# Patient Record
Sex: Male | Born: 1940 | Race: White | Hispanic: No | Smoking: Never smoker
Health system: Southern US, Community
[De-identification: ages and names within clinical notes are randomized; demographics above are authoritative.]

## PROBLEM LIST (undated history)

## (undated) DIAGNOSIS — I10 Essential (primary) hypertension: Secondary | ICD-10-CM

## (undated) DIAGNOSIS — Z87442 Personal history of urinary calculi: Secondary | ICD-10-CM

## (undated) DIAGNOSIS — Z9861 Coronary angioplasty status: Secondary | ICD-10-CM

## (undated) DIAGNOSIS — K5730 Diverticulosis of large intestine without perforation or abscess without bleeding: Secondary | ICD-10-CM

## (undated) DIAGNOSIS — H269 Unspecified cataract: Secondary | ICD-10-CM

## (undated) DIAGNOSIS — I255 Ischemic cardiomyopathy: Secondary | ICD-10-CM

## (undated) DIAGNOSIS — I4510 Unspecified right bundle-branch block: Secondary | ICD-10-CM

## (undated) DIAGNOSIS — J189 Pneumonia, unspecified organism: Secondary | ICD-10-CM

## (undated) DIAGNOSIS — I639 Cerebral infarction, unspecified: Secondary | ICD-10-CM

## (undated) DIAGNOSIS — G629 Polyneuropathy, unspecified: Secondary | ICD-10-CM

## (undated) DIAGNOSIS — N471 Phimosis: Secondary | ICD-10-CM

## (undated) DIAGNOSIS — Z85828 Personal history of other malignant neoplasm of skin: Secondary | ICD-10-CM

## (undated) DIAGNOSIS — R351 Nocturia: Secondary | ICD-10-CM

## (undated) DIAGNOSIS — Z973 Presence of spectacles and contact lenses: Secondary | ICD-10-CM

## (undated) DIAGNOSIS — R112 Nausea with vomiting, unspecified: Secondary | ICD-10-CM

## (undated) DIAGNOSIS — Z86010 Personal history of colonic polyps: Secondary | ICD-10-CM

## (undated) DIAGNOSIS — I219 Acute myocardial infarction, unspecified: Secondary | ICD-10-CM

## (undated) DIAGNOSIS — Z8489 Family history of other specified conditions: Secondary | ICD-10-CM

## (undated) DIAGNOSIS — I252 Old myocardial infarction: Secondary | ICD-10-CM

## (undated) DIAGNOSIS — M1990 Unspecified osteoarthritis, unspecified site: Secondary | ICD-10-CM

## (undated) DIAGNOSIS — Z955 Presence of coronary angioplasty implant and graft: Secondary | ICD-10-CM

## (undated) DIAGNOSIS — G4733 Obstructive sleep apnea (adult) (pediatric): Secondary | ICD-10-CM

## (undated) DIAGNOSIS — Z9889 Other specified postprocedural states: Secondary | ICD-10-CM

## (undated) DIAGNOSIS — Z8673 Personal history of transient ischemic attack (TIA), and cerebral infarction without residual deficits: Secondary | ICD-10-CM

## (undated) DIAGNOSIS — K219 Gastro-esophageal reflux disease without esophagitis: Secondary | ICD-10-CM

## (undated) DIAGNOSIS — E119 Type 2 diabetes mellitus without complications: Secondary | ICD-10-CM

## (undated) DIAGNOSIS — E785 Hyperlipidemia, unspecified: Secondary | ICD-10-CM

## (undated) DIAGNOSIS — Z860101 Personal history of adenomatous and serrated colon polyps: Secondary | ICD-10-CM

## (undated) DIAGNOSIS — I2510 Atherosclerotic heart disease of native coronary artery without angina pectoris: Secondary | ICD-10-CM

## (undated) HISTORY — PX: CARDIOVASCULAR STRESS TEST: SHX262

## (undated) HISTORY — PX: CERVICAL SPINE SURGERY: SHX589

## (undated) HISTORY — PX: HERNIA REPAIR: SHX51

## (undated) HISTORY — DX: Gastro-esophageal reflux disease without esophagitis: K21.9

## (undated) HISTORY — PX: APPENDECTOMY: SHX54

## (undated) HISTORY — DX: Hyperlipidemia, unspecified: E78.5

## (undated) HISTORY — DX: Essential (primary) hypertension: I10

## (undated) HISTORY — PX: LAPAROSCOPIC CHOLECYSTECTOMY: SUR755

## (undated) HISTORY — DX: Cerebral infarction, unspecified: I63.9

## (undated) HISTORY — DX: Unspecified osteoarthritis, unspecified site: M19.90

## (undated) HISTORY — PX: OTHER SURGICAL HISTORY: SHX169

## (undated) HISTORY — PX: DIAGNOSTIC LAPAROSCOPY: SUR761

## (undated) HISTORY — PX: COLON SURGERY: SHX602

## (undated) HISTORY — PX: CARDIAC CATHETERIZATION: SHX172

## (undated) HISTORY — PX: CATARACT EXTRACTION: SUR2

---

## 2002-04-20 ENCOUNTER — Ambulatory Visit (HOSPITAL_BASED_OUTPATIENT_CLINIC_OR_DEPARTMENT_OTHER): Admission: RE | Admit: 2002-04-20 | Discharge: 2002-04-20 | Payer: Self-pay | Admitting: Family Medicine

## 2002-06-12 ENCOUNTER — Ambulatory Visit (HOSPITAL_BASED_OUTPATIENT_CLINIC_OR_DEPARTMENT_OTHER): Admission: RE | Admit: 2002-06-12 | Discharge: 2002-06-12 | Payer: Self-pay | Admitting: Family Medicine

## 2003-12-20 ENCOUNTER — Ambulatory Visit (HOSPITAL_COMMUNITY): Admission: RE | Admit: 2003-12-20 | Discharge: 2003-12-20 | Payer: Self-pay | Admitting: Specialist

## 2004-01-23 ENCOUNTER — Encounter: Admission: RE | Admit: 2004-01-23 | Discharge: 2004-01-23 | Payer: Self-pay | Admitting: Specialist

## 2004-05-23 ENCOUNTER — Emergency Department (HOSPITAL_COMMUNITY): Admission: EM | Admit: 2004-05-23 | Discharge: 2004-05-23 | Payer: Self-pay | Admitting: Emergency Medicine

## 2004-09-16 ENCOUNTER — Ambulatory Visit: Payer: Self-pay | Admitting: *Deleted

## 2004-09-16 ENCOUNTER — Inpatient Hospital Stay (HOSPITAL_COMMUNITY): Admission: AD | Admit: 2004-09-16 | Discharge: 2004-09-18 | Payer: Self-pay | Admitting: *Deleted

## 2004-09-30 ENCOUNTER — Ambulatory Visit: Payer: Self-pay | Admitting: Internal Medicine

## 2005-10-28 ENCOUNTER — Ambulatory Visit: Payer: Self-pay | Admitting: Cardiology

## 2005-11-22 ENCOUNTER — Encounter (INDEPENDENT_AMBULATORY_CARE_PROVIDER_SITE_OTHER): Payer: Self-pay | Admitting: *Deleted

## 2005-11-22 ENCOUNTER — Ambulatory Visit (HOSPITAL_COMMUNITY): Admission: RE | Admit: 2005-11-22 | Discharge: 2005-11-22 | Payer: Self-pay | Admitting: General Surgery

## 2007-05-15 ENCOUNTER — Encounter: Admission: RE | Admit: 2007-05-15 | Discharge: 2007-05-15 | Payer: Self-pay | Admitting: Internal Medicine

## 2008-12-10 ENCOUNTER — Ambulatory Visit: Payer: Self-pay

## 2008-12-10 ENCOUNTER — Encounter (INDEPENDENT_AMBULATORY_CARE_PROVIDER_SITE_OTHER): Payer: Self-pay | Admitting: Orthopedic Surgery

## 2008-12-10 ENCOUNTER — Encounter: Payer: Self-pay | Admitting: Cardiovascular Disease

## 2009-01-06 ENCOUNTER — Inpatient Hospital Stay (HOSPITAL_COMMUNITY): Admission: EM | Admit: 2009-01-06 | Discharge: 2009-01-08 | Payer: Self-pay | Admitting: Emergency Medicine

## 2009-01-06 ENCOUNTER — Ambulatory Visit: Payer: Self-pay | Admitting: Cardiovascular Disease

## 2009-01-06 ENCOUNTER — Encounter (INDEPENDENT_AMBULATORY_CARE_PROVIDER_SITE_OTHER): Payer: Self-pay | Admitting: Neurology

## 2009-01-08 ENCOUNTER — Encounter: Payer: Self-pay | Admitting: Internal Medicine

## 2009-07-09 ENCOUNTER — Ambulatory Visit (HOSPITAL_COMMUNITY): Admission: RE | Admit: 2009-07-09 | Discharge: 2009-07-09 | Payer: Self-pay | Admitting: Family Medicine

## 2009-07-17 ENCOUNTER — Ambulatory Visit (HOSPITAL_COMMUNITY): Admission: RE | Admit: 2009-07-17 | Discharge: 2009-07-17 | Payer: Self-pay | Admitting: Family Medicine

## 2009-09-01 ENCOUNTER — Inpatient Hospital Stay (HOSPITAL_COMMUNITY): Admission: RE | Admit: 2009-09-01 | Discharge: 2009-09-07 | Payer: Self-pay | Admitting: General Surgery

## 2009-09-01 ENCOUNTER — Encounter (INDEPENDENT_AMBULATORY_CARE_PROVIDER_SITE_OTHER): Payer: Self-pay | Admitting: General Surgery

## 2010-07-10 ENCOUNTER — Encounter
Admission: RE | Admit: 2010-07-10 | Discharge: 2010-07-10 | Payer: Self-pay | Source: Home / Self Care | Attending: General Surgery | Admitting: General Surgery

## 2010-08-20 IMAGING — CR DG CHEST 2V
2 series · 2 of 2 positions shown · non-contrast
Comparison: 05/15/2007 and 11/18/2005

CLINICAL DATA: Preoperative respiratory examination for bowel
surgery.

CHEST - 2 VIEW

[w chest pa]
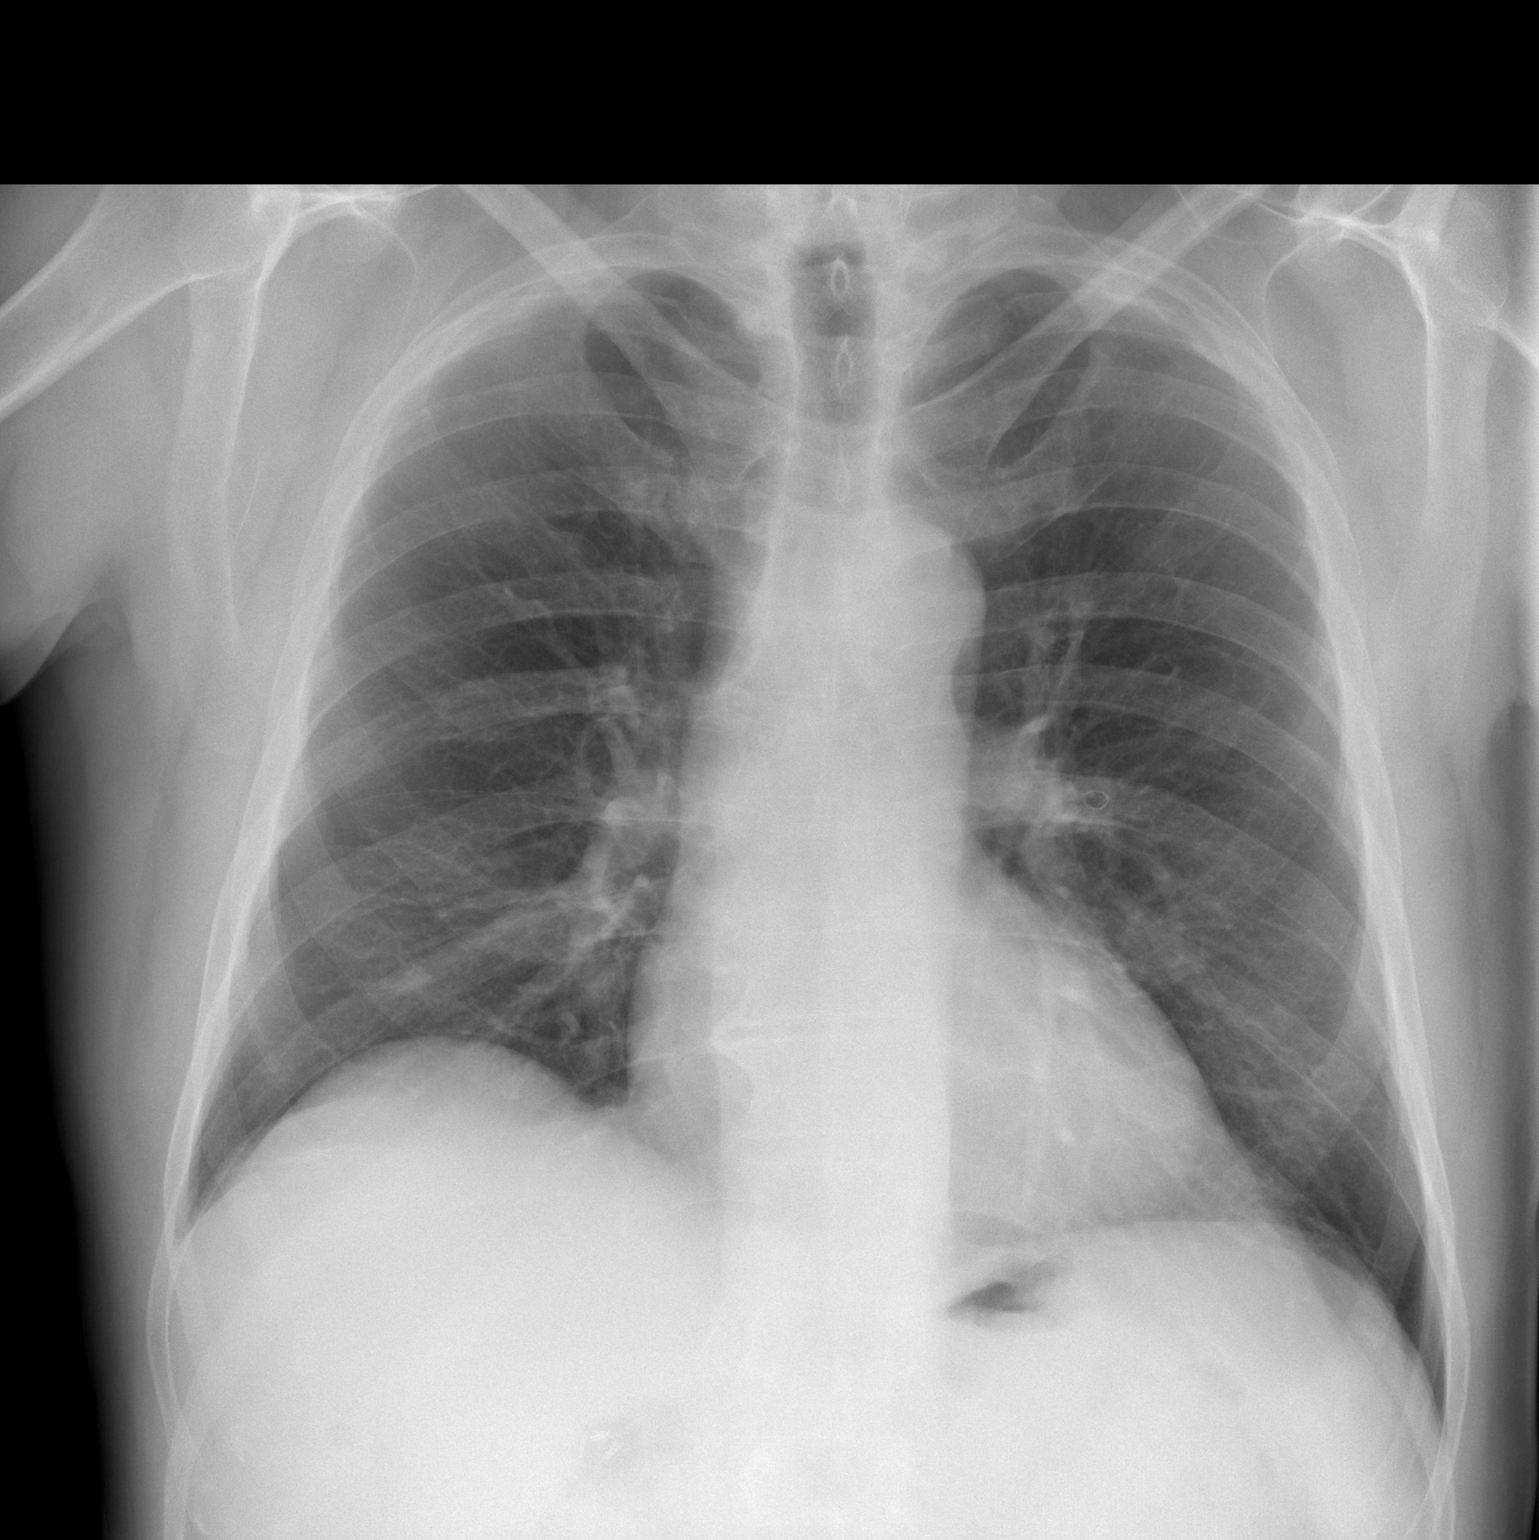

[w chest lat]
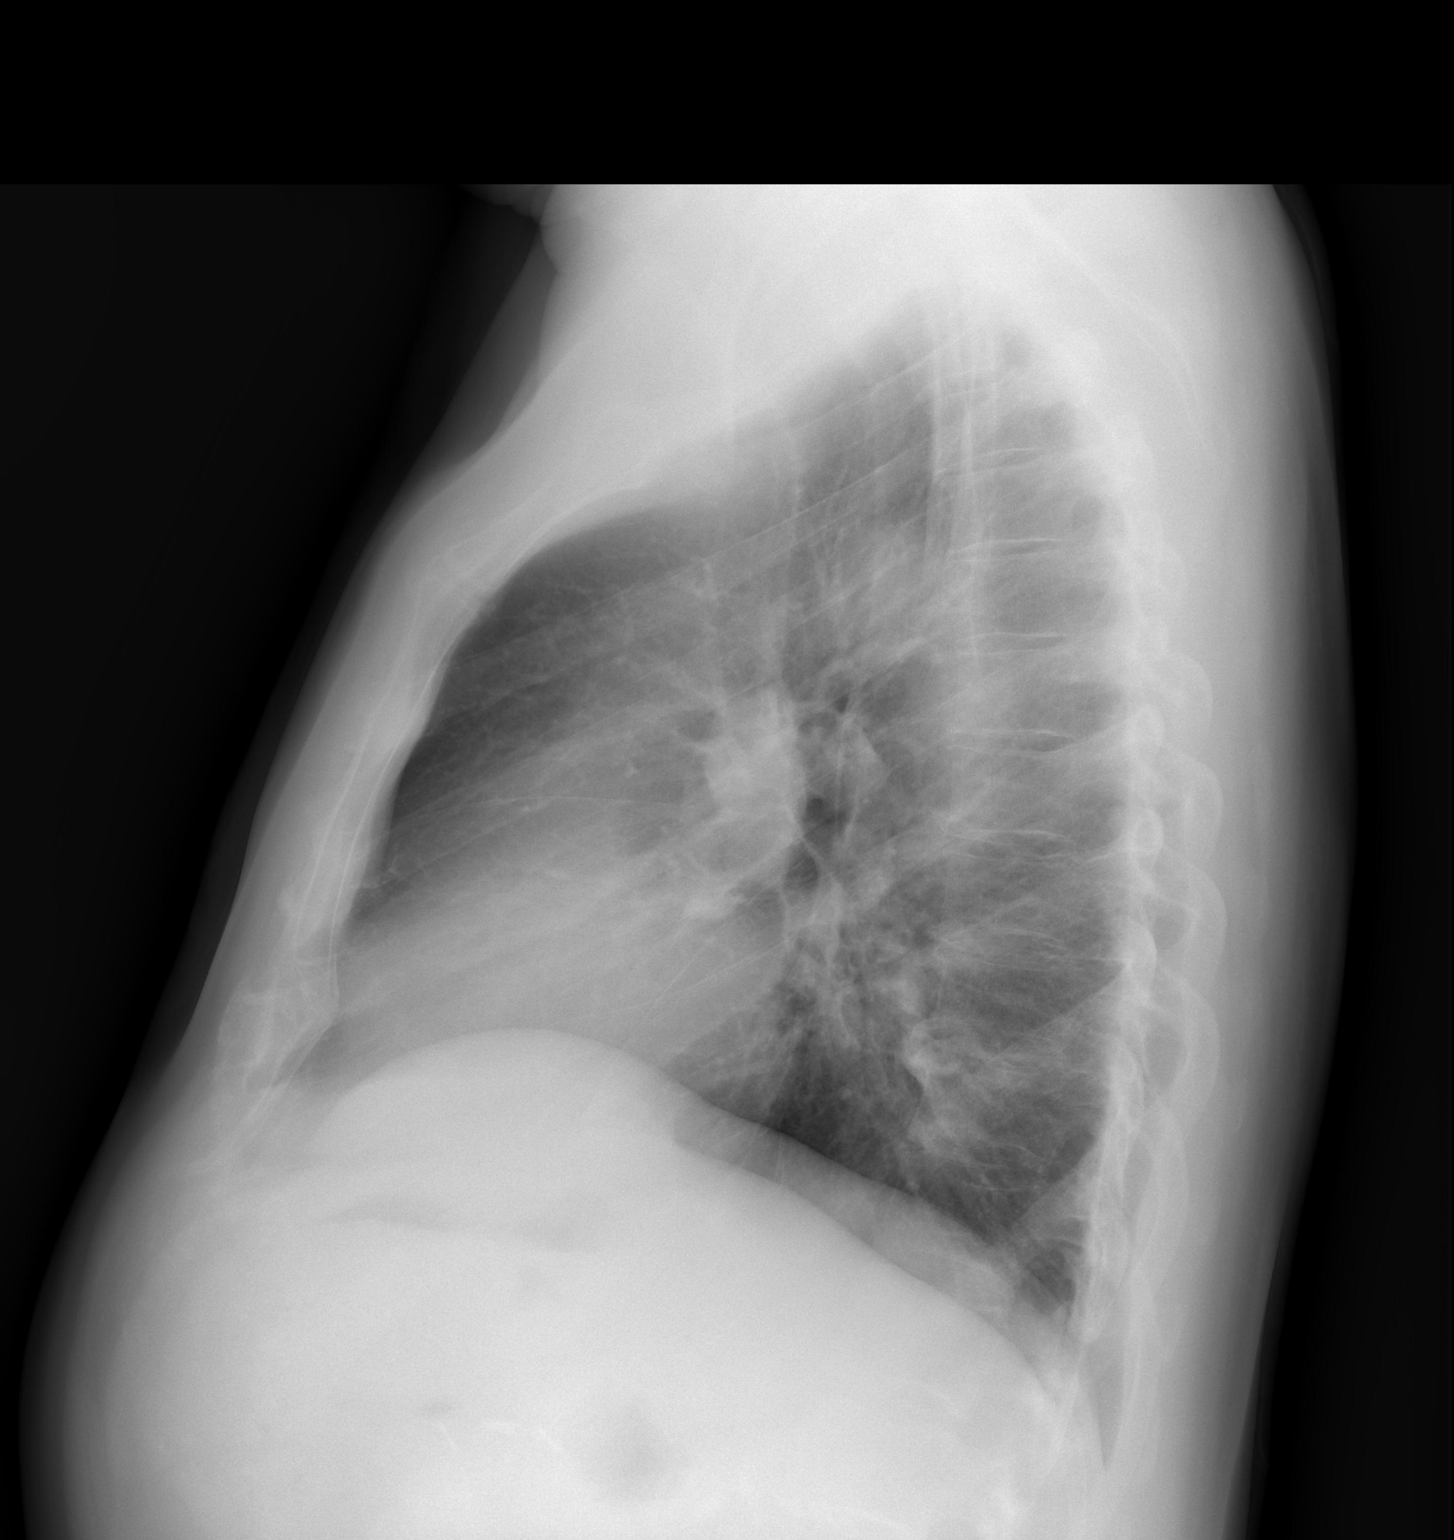

[2 of 2 positions shown; findings below may reference images not displayed]

FINDINGS: The cardiomediastinal silhouette is unremarkable.
Lungs are clear.
Mild elevation of the right hemidiaphragm is noted.
There is no evidence of focal airspace disease, pulmonary edema,
pleural effusion, or pneumothorax.
No acute bony abnormalities are identified.
IMPRESSION: No evidence of active cardiopulmonary disease.

## 2010-09-06 LAB — CULTURE, ROUTINE-ABSCESS: Culture: NO GROWTH

## 2010-09-06 LAB — CBC
HCT: 45.1 % (ref 39.0–52.0)
Hemoglobin: 15.1 g/dL (ref 13.0–17.0)
MCHC: 33.6 g/dL (ref 30.0–36.0)
MCV: 88.6 fL (ref 78.0–100.0)
Platelets: 227 10*3/uL (ref 150–400)
RBC: 5.09 MIL/uL (ref 4.22–5.81)
RDW: 14.9 % (ref 11.5–15.5)
WBC: 6.4 10*3/uL (ref 4.0–10.5)

## 2010-09-06 LAB — ANAEROBIC CULTURE

## 2010-09-06 LAB — PROTIME-INR
INR: 0.98 (ref 0.00–1.49)
Prothrombin Time: 12.9 seconds (ref 11.6–15.2)

## 2010-09-06 LAB — GLUCOSE, CAPILLARY
Glucose-Capillary: 107 mg/dL — ABNORMAL HIGH (ref 70–99)
Glucose-Capillary: 134 mg/dL — ABNORMAL HIGH (ref 70–99)

## 2010-09-06 LAB — APTT: aPTT: 28 seconds (ref 24–37)

## 2010-09-13 LAB — COMPREHENSIVE METABOLIC PANEL
Alkaline Phosphatase: 86 U/L (ref 39–117)
BUN: 15 mg/dL (ref 6–23)
CO2: 27 mEq/L (ref 19–32)
Chloride: 105 mEq/L (ref 96–112)
Creatinine, Ser: 0.92 mg/dL (ref 0.4–1.5)
GFR calc non Af Amer: >60 mL/min (ref >60)
Glucose, Bld: 182 mg/dL — ABNORMAL HIGH (ref 70–99)
Potassium: 3.8 mEq/L (ref 3.5–5.1)
Total Bilirubin: 0.6 mg/dL (ref 0.3–1.2)

## 2010-09-13 LAB — CULTURE, ROUTINE-ABSCESS: Culture: NO GROWTH

## 2010-09-13 LAB — DIFFERENTIAL
Basophils Absolute: 0 10*3/uL (ref 0.0–0.1)
Basophils Relative: 0 % (ref 0–1)
Lymphocytes Relative: 24 % (ref 12–46)
Neutro Abs: 3.8 10*3/uL (ref 1.7–7.7)
Neutrophils Relative %: 66 % (ref 43–77)

## 2010-09-13 LAB — BASIC METABOLIC PANEL
BUN: 14 mg/dL (ref 6–23)
CO2: 29 mEq/L (ref 19–32)
Calcium: 8.3 mg/dL — ABNORMAL LOW (ref 8.4–10.5)
Calcium: 9 mg/dL (ref 8.4–10.5)
Chloride: 105 mEq/L (ref 96–112)
Creatinine, Ser: 0.88 mg/dL (ref 0.4–1.5)
Creatinine, Ser: 0.94 mg/dL (ref 0.4–1.5)
GFR calc Af Amer: >60 mL/min (ref >60)
GFR calc Af Amer: >60 mL/min (ref >60)
GFR calc non Af Amer: >60 mL/min (ref >60)
Sodium: 137 mEq/L (ref 135–145)

## 2010-09-13 LAB — CBC
HCT: 45.2 % (ref 39.0–52.0)
Hemoglobin: 13.1 g/dL (ref 13.0–17.0)
Hemoglobin: 14.8 g/dL (ref 13.0–17.0)
MCHC: 32.8 g/dL (ref 30.0–36.0)
MCHC: 33.1 g/dL (ref 30.0–36.0)
MCV: 89.9 fL (ref 78.0–100.0)
MCV: 90.3 fL (ref 78.0–100.0)
Platelets: 157 10*3/uL (ref 150–400)
Platelets: 206 10*3/uL (ref 150–400)
RBC: 4.42 MIL/uL (ref 4.22–5.81)
RBC: 5.01 MIL/uL (ref 4.22–5.81)
WBC: 5.7 10*3/uL (ref 4.0–10.5)
WBC: 6.2 10*3/uL (ref 4.0–10.5)

## 2010-09-13 LAB — GLUCOSE, CAPILLARY: Glucose-Capillary: 109 mg/dL — ABNORMAL HIGH (ref 70–99)

## 2010-09-13 LAB — TISSUE CULTURE

## 2010-09-13 LAB — ANAEROBIC CULTURE

## 2010-09-27 LAB — COMPREHENSIVE METABOLIC PANEL
ALT: 31 U/L (ref 0–53)
AST: 30 U/L (ref 0–37)
Alkaline Phosphatase: 82 U/L (ref 39–117)
CO2: 23 mEq/L (ref 19–32)
Calcium: 9.4 mg/dL (ref 8.4–10.5)
GFR calc Af Amer: >60 mL/min (ref >60)
GFR calc non Af Amer: >60 mL/min (ref >60)
Potassium: 3.8 mEq/L (ref 3.5–5.1)
Sodium: 136 mEq/L (ref 135–145)

## 2010-09-27 LAB — LIPID PANEL
LDL Cholesterol: 123 mg/dL — ABNORMAL HIGH (ref 0–99)
Triglycerides: 163 mg/dL — ABNORMAL HIGH (ref <150)

## 2010-09-27 LAB — DIFFERENTIAL
Eosinophils Absolute: 0.2 10*3/uL (ref 0.0–0.7)
Eosinophils Relative: 3 % (ref 0–5)
Lymphs Abs: 1.8 10*3/uL (ref 0.7–4.0)
Monocytes Relative: 9 % (ref 3–12)

## 2010-09-27 LAB — PROTIME-INR: Prothrombin Time: 12.9 seconds (ref 11.6–15.2)

## 2010-09-27 LAB — URINALYSIS, ROUTINE W REFLEX MICROSCOPIC
Nitrite: NEGATIVE
Specific Gravity, Urine: 1.017 (ref 1.005–1.030)
pH: 6 (ref 5.0–8.0)

## 2010-09-27 LAB — CBC
HCT: 47 % (ref 39.0–52.0)
Hemoglobin: 16.1 g/dL (ref 13.0–17.0)
MCV: 89.3 fL (ref 78.0–100.0)
RBC: 5.27 MIL/uL (ref 4.22–5.81)
WBC: 7.8 10*3/uL (ref 4.0–10.5)

## 2010-09-27 LAB — CK TOTAL AND CKMB (NOT AT ARMC): Total CK: 123 U/L (ref 7–232)

## 2010-09-27 LAB — GLUCOSE, CAPILLARY: Glucose-Capillary: 115 mg/dL — ABNORMAL HIGH (ref 70–99)
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
## 2010-11-06 NOTE — Op Note (Signed)
NAMEUMAR, PATMON NO.:  [id number]   MEDICAL RECORD NO.:  [id number]          PATIENT TYPE:  AMB   LOCATION:  DAY                          FACILITY:  [facility]   PHYSICIAN:  Timothy E. [name], M.D. DATE OF BIRTH:  [birth year]   DATE OF PROCEDURE:  11/22/2005  DATE OF DISCHARGE:                                 OPERATIVE REPORT   PREOPERATIVE DIAGNOSIS:  Symptomatic cholecystolithiasis.   POSTOPERATIVE DIAGNOSIS:  Symptomatic cholecystolithiasis.   PROCEDURE:  Laparoscopic cholecystectomy and cholangiogram.   SURGEON:  Timothy E. [name], M.D.   ASSISTANT:  Lebron Conners, M.D.   ANESTHESIA:  General.   Jose Duarte, otherwise healthy, has symptomatic gallstones, also reflux  under control and negative cardiac history.  He has changed his diet and  wishes to proceed with this surgery as it has been carefully explained.  He  was seen, identified, and the permit signed.  Notably, his laboratory data  is normal today.  He was identified and evaluated by anesthesia.   He was taken to the operating room, placed supine, general endotracheal  anesthesia administered.  The abdomen was prepped and draped in the usual  fashion.  Marcaine 0.25% with epinephrine was used prior to each incision.  A vertical incision was made infraumbilical; the fascia identified, opened  at the midline; peritoneum entered without complication.  The Hasson  catheter was placed and tied in place with a #1 Vicryl.  A second 10-mm  trocar was introduced through the mid-epigastrium under direction.  Two 5-mm  trocars in the right upper quadrant.  The gallbladder was grasped, placed on  tension.  A prominent anterior tubular structure was identified, dissected  out; thought perhaps to be the cystic duct, but in fact it was the cystic  artery.  It was triply clipped and divided.  Just peak to that, a normal-  appearing cystic artery entering the gallbladder was identified, carefully  dissected out.   A clip was placed on the gallbladder side.  It was opened.  Bile leaked as expected and a percutaneously placed cholangiogram catheter  was placed into the cystic duct remnant.  A clip was placed.  Using real-  time fluoroscopy and half-strength Hypaque, a cholangiogram was made,  showing rapid emptying of dye into the duodenum with a normal biliary growth  structure. The clip and catheter were removed.  The stump of the cystic duct  was triply clipped and fully divided.  The gallbladder was dissected from  the gallbladder bed, one additional clip being placed.  I did make a rent in  the gallbladder and one stone escaped.  That stone was seen and completely  extracted.  The bile was quickly diluted with saline.  The gallbladder was  removed.  The gallbladder bed was dry.  The gallbladder was placed into an  endo-catch bag and removed from the abdomen.  Copious irrigation was carried  out until clear.  Gallbladder bed was inspected and was found to be  hemostatic.  No other stones or remnants were seen and the irrigant was  clear.  All counts were correct.  CO2 irrigant, instruments,  and trocars were  removed with direct vision, and the case was completed.  All wounds on  inspection were closed with 3-0 Monocryl and/or Steri-Strips.  Final counts  were correct.  He was awakened and taken to the recovery room in good  condition.      Timothy E. [name], M.D.  Electronically Signed     TED/MEDQ  D:  11/22/2005  T:  11/23/2005  Job:  [id number]   cc:   Windle Guard, M.D.  Fax: [phone]   Jesse Sans. Wall, M.D.  [address]  [address]  [place]  [place] [address]

## 2010-11-06 NOTE — Cardiovascular Report (Signed)
NAME:  Jose Duarte, Jose Duarte NO.:  [id number]   MEDICAL RECORD NO.:  [id number]           PATIENT TYPE:  INP   LOCATION:                               FACILITY:  [facility]   PHYSICIAN:  Carole Binning, M.D. LHCDATE OF BIRTH:  July 27, 1940   DATE OF PROCEDURE:  09/18/2004  DATE OF DISCHARGE:                              CARDIAC CATHETERIZATION   PROCEDURE PERFORMED:  Left heart catheterization with coronary angiography  and left ventriculography.   INDICATIONS:  The patient is a 70 year old male who presented to the office  with symptoms of chest pain occurring both nocturnally as well as with  exertion.  He was evaluated by Dr. Corinda Gubler and admitted and referred for  cardiac catheterization.   PROCEDURAL NOTE:  A 6-French sheath was placed in the right femoral artery.  The patient developed a vasovagal reaction shortly after we obtained  arterial access.  He was given 1 mg of intravenous atropine with good  response.  Coronary angiography was performed with a standard Judkins 6-  French catheter.  The left ventriculography was performed with a pigtail  catheter.  Contrast was Omnipaque.  There were no complications.   RESULTS:  Hemodynamics:  Left ventricular pressure 116/70, aortic pressure  116/70.  There was no aortic valve gradient on catheter pullback.   Left ventriculogram:  Wall motion is normal.  Ejection fraction is estimated  at 60%.  There is no mitral regurgitation.   Coronary arteriography:  Left main is normal.  Left anterior descending  artery has a 20% stenosis in the mid-vessel.  The LAD is otherwise normal  giving rise to a normal size first diagonal branch and a small second  diagonal branch.   Left circumflex gives rise to a large bifurcating first obtuse marginal and  a small second obtuse marginal and small second obtuse marginal branch.  There are minor luminal irregularities in the first obtuse marginal branch.   The right coronary artery is a  dominant vessel.  It gives rise to a large  acute marginal which supplies the inferoapical septum.  There is a small  posterior descending artery and a normal size posterolateral branch.  There  are minor luminal irregularities in the proximal right coronary artery.   IMPRESSION:  1.  Normal left ventricular systolic function.  2. No significant coronary      artery disease.   PLAN:  The patient will be managed medically.  Alternative etiologies for  the patient's chest pain should be evaluated.      MWP/MEDQ  D:  09/18/2004  T:  09/18/2004  Job:  [id number]   cc:   Windle Guard, M.D.  [address]  [place], [place] [address]  Fax: [phone]   Bea Laura Graceann Congress, M.D.

## 2010-11-06 NOTE — Discharge Summary (Signed)
Jose Duarte, Jose Duarte NO.:  [id number]   MEDICAL RECORD NO.:  [id number]          PATIENT TYPE:  INP   LOCATION:  3743                         FACILITY:  [facility]   PHYSICIAN:  Jose Duarte, M.D.DATE OF BIRTH:  1940/10/08   DATE OF ADMISSION:  09/16/2004  DATE OF DISCHARGE:  09/18/2004                                 DISCHARGE SUMMARY   DISCHARGE DIAGNOSIS:  Chest pain with negative cardiac enzymes, EKG with  nonspecific ST abnormality resolved, status post cardiac catheterization  September 18, 2004, showing normal LVEF with no significant coronary artery  disease.   PAST MEDICAL HISTORY:  Neck surgery in the 1980s, hernia repair in the  1970s, finger surgery and nose surgery, hypertension, hyperlipidemia,  cholelithiasis, penicillin allergy.   DISPOSITION:  The patient is being discharged home with his wife.  He has  been instructed no driving x 2 days, no lifting over 10 pounds x 1 week, he  may shower, avoid tub bathing sitting in the bath tub x 1 week, Tylenol for  general discomfort, he has a prescription for Protonix 40 mg, he is to start  today, we can continue his hydrochlorothiazide on Saturday, September 19, 2004,  he will follow up with Jose Duarte, he will schedule appointment, himself.  He has a follow up appointment with Dr. Albertina Parr PA on April 12 at 3  p.m. for a post cath check.  He is to call the office for any fever, any  pain, swelling, or discharge from the cath site.   HOSPITAL COURSE:  This is a very pleasant 70 year old Caucasian gentleman  with several month history of chest and bilateral arm pain associated with  shortness of breath and diaphoresis.  The patient has a history of  hypertension and hyperlipidemia this admission.  His total cholesterol was  259, his triglycerides are 352, his HDL was 35, and LDL was 154.  He has had  elevated lipid panel in the past, I am not sure as to why he is not on  Nystatin at this time.  He will  need follow up care with his primary care  physician for possible statin therapy and lifestyle modifications.  Mr.  Duarte was seen by Jose Duarte on March 29 for his complaints of chest  pain.  It was decided at that time the patient needed to be admitted for  further cardiac workup also secondary to EKG revealing a normal sinus rhythm  with left anterior hemiblock and minor nonspecific ST abnormalities.  The  patient was placed on Plavix and intravenous heparin, also started him on a  beta blocker, also the patient was started on Lipitor.  We will need to  continue his Lipitor at home as he is tolerating it so far and has a lipid  panel and liver panel checked in 4-6 weeks.  Patient to the cath lab on  March 31, results as stated above.  The patient tolerated the procedure  without complications.  Post cath site right groin negative bruit, negative  for hematoma, positive distal pulses, patient being  discharged home with follow up as  stated above.  I will also give him a  prescription for the Lipitor as I did not realize that Lipitor was already  initiated here.  He can follow up with Jose Duarte for fasting lipid and  liver in 4-6 weeks.      MB/MEDQ  D:  09/18/2004  T:  09/18/2004  Job:  [id number]   cc:   Jose Duarte, M.D.  [address]  [place], [place] [address]  Fax: [phone]

## 2010-11-19 ENCOUNTER — Ambulatory Visit (HOSPITAL_COMMUNITY)
Admission: RE | Admit: 2010-11-19 | Discharge: 2010-11-19 | Disposition: A | Discharge status: Home / Self Care | Payer: Medicare Other | Source: Ambulatory Visit | Attending: Gastroenterology | Admitting: Gastroenterology

## 2010-11-19 ENCOUNTER — Other Ambulatory Visit: Payer: Self-pay | Admitting: Gastroenterology

## 2010-11-19 DIAGNOSIS — Z9049 Acquired absence of other specified parts of digestive tract: Secondary | ICD-10-CM | POA: Insufficient documentation

## 2010-11-19 DIAGNOSIS — R197 Diarrhea, unspecified: Secondary | ICD-10-CM | POA: Insufficient documentation

## 2010-11-19 DIAGNOSIS — K5900 Constipation, unspecified: Secondary | ICD-10-CM | POA: Insufficient documentation

## 2010-11-19 DIAGNOSIS — Z9089 Acquired absence of other organs: Secondary | ICD-10-CM | POA: Insufficient documentation

## 2010-11-19 DIAGNOSIS — Z79899 Other long term (current) drug therapy: Secondary | ICD-10-CM | POA: Insufficient documentation

## 2010-11-19 DIAGNOSIS — Z8673 Personal history of transient ischemic attack (TIA), and cerebral infarction without residual deficits: Secondary | ICD-10-CM | POA: Insufficient documentation

## 2010-11-19 DIAGNOSIS — K5730 Diverticulosis of large intestine without perforation or abscess without bleeding: Secondary | ICD-10-CM | POA: Insufficient documentation

## 2010-11-19 DIAGNOSIS — Z860100 Personal history of colon polyps, unspecified: Secondary | ICD-10-CM | POA: Insufficient documentation

## 2010-11-19 DIAGNOSIS — I10 Essential (primary) hypertension: Secondary | ICD-10-CM | POA: Insufficient documentation

## 2010-11-19 DIAGNOSIS — Z7982 Long term (current) use of aspirin: Secondary | ICD-10-CM | POA: Insufficient documentation

## 2010-11-19 DIAGNOSIS — R109 Unspecified abdominal pain: Secondary | ICD-10-CM | POA: Insufficient documentation

## 2010-11-19 DIAGNOSIS — K219 Gastro-esophageal reflux disease without esophagitis: Secondary | ICD-10-CM | POA: Insufficient documentation

## 2010-11-19 NOTE — Op Note (Signed)
NAME:  Jose Duarte, Jose Duarte NO.:  [id number]  MEDICAL RECORD NO.:  [id number]           PATIENT TYPE:  O  LOCATION:  [facility]                         FACILITY:  [facility]  PHYSICIAN:  Danise Edge, M.D.   DATE OF BIRTH:  [birth year]  DATE OF PROCEDURE:  11/19/2010 DATE OF DISCHARGE:                              OPERATIVE REPORT   PROCEDURE:  Esophagogastroduodenoscopy, small-bowel biopsies, colonoscopy and random colon biopsy.  REFERRING PHYSICIAN:  Anselm Pancoast. Zachery Dakins, M.D.  HISTORY:  [name] is a 70 year old male, born [birth year].  The patient has undergone a segmental right colon resection to remove a neoplastic but noncancerous polyp in the cecum.  He has also undergone a cholecystectomy.  The patient is experiencing right-sided abdominal pain, constipation, and rectal burning discomfort associated with excessive mucus reduction associated with bowel movements.  He reports no gastrointestinal bleeding, nausea, vomiting, or anorexia.  The patient takes aspirin on a daily basis.  He takes a proton-pump inhibitor on a daily basis.  The patient was prescribed a stool softener for his constipation.  He had to discontinue the stool softener room when he developed diarrhea.  PAST MEDICAL HISTORY:  Stroke, gastroesophageal reflux, hypertension, cholecystectomy, segmental right colon resection to remove a neoplastic colon polyp.  ENDOSCOPIST:  Danise Edge, M.D.  PREMEDICATION:  Fentanyl 100 mcg, Versed 10 mg.  PROCEDURE:  Esophagogastroduodenoscopy with small bowel biopsies.  The Pentax gastroscope was passed through the posterior hypopharynx into the proximal esophagus without difficulty.  The hypopharynx, larynx and vocal cords appeared normal.  Esophagoscopy:  The proximal mid and lower segments of the esophageal mucosa appeared normal.  The squamocolumnar junction is noted at 40 cm from the incisor teeth.  There is no endoscopic evidence  for the presence of erosive esophagitis or Barrett's esophagus.  Gastroscopy:  Retroflex view of the gastric cardia and fundus was normal.  The gastric body, antrum and pylorus appeared normal.  Duodenoscopy:  The duodenal bulb and descending duodenum appeared normal.  Biopsies:  Four biopsies were taken from the second portion of the duodenum to look for celiac disease.  ASSESSMENT:  Normal esophagogastroduodenoscopy.  Small bowel biopsies pending.  PROCEDURE:  Diagnostic colonoscopy.  Anal inspection and digital rectal exam were normal.  The Pentax pediatric colonoscope was introduced into the rectum and advanced to the ileal right colonic surgical anastomosis. Advancement of the endoscope was technically difficult due to significant colonic loop formation.  Rectum normal.  Retroflex view of the distal rectum normal.  Sigmoid colon and descending colon.  Left colonic diverticulosis.  Splenic flexure normal.  Transverse colon normal.  Hepatic flexure normal.  Ascending colon normal.  Ileocolonic surgical anastomosis normal.BIOPSIES:  Random colon biopsies were performed to rule out microscopic colitis.  ASSESSMENT:  Normal colonoscopy post right segmental colon resection to remove neoplastic but noncancerous colon polyp in the cecum.  Left colonic diverticulosis.  Random colon biopsies to look for microscopic colitis pending.          ______________________________ Danise Edge, M.D.     MJ/MEDQ  D:  11/19/2010  T:  11/19/2010  Job:  [id number]  cc:  Anselm Pancoast. Zachery Dakins, M.D. [address] [place] [address]  Windle Guard, M.D. Fax: [phone]  Electronically Signed by Danise Edge M.D. on 11/19/2010 05:29:00 PM

## 2011-01-01 ENCOUNTER — Ambulatory Visit (INDEPENDENT_AMBULATORY_CARE_PROVIDER_SITE_OTHER): Payer: Medicare Other | Admitting: General Surgery

## 2011-01-01 ENCOUNTER — Encounter (INDEPENDENT_AMBULATORY_CARE_PROVIDER_SITE_OTHER): Payer: Self-pay | Admitting: General Surgery

## 2011-01-01 VITALS — BP 132/78 | HR 60 | Temp 96.6°F | Ht 72.0 in | Wt 205.2 lb

## 2011-01-01 DIAGNOSIS — K5904 Chronic idiopathic constipation: Secondary | ICD-10-CM

## 2011-01-01 DIAGNOSIS — K432 Incisional hernia without obstruction or gangrene: Secondary | ICD-10-CM

## 2011-01-01 DIAGNOSIS — K5909 Other constipation: Secondary | ICD-10-CM

## 2011-01-01 NOTE — Patient Instructions (Signed)
Please start taking MiraLax on a daily basis and its best to take this in the morning. If you do not have a good bowel movement during a 24-hour period you may taken next a dose of MiraLax if needed.  Plan on seeing me in approximately 6 weeks and we will plan on scheduling your hernia repair at that time. If you still have episodes of cramping abdominal pain with the MiraLax we may want to do a repeat CT of the abdomen and pelvis  I hope you have a great vacation to a [place]

## 2011-01-04 NOTE — Progress Notes (Signed)
Subjective:     Patient ID: Jose Duarte, male   DOB: [birth year], 70 y.o.   MRN: [id number]  HPI  The patient is a complicated patient who had a urgent laparoscopic cholecystectomy with cholangiogram performed by Dr. Kendrick Ranch approximately 5 years He started having episodes of fever right upper quadrant abdominal pain and occur off normal appearance several years and since he had had a previous problem with kidney stones saw a urologist who obtained a CT that showed an inflammatory process in the right upper quadrant of his abdomenThe inflammatory process was evaluated with a percutaneous biopsy performed one of the radiologist should say inflammatory mass but the question whether it could be a malignancy he then was referred to [name] on examination and reviewing the previous x-rays he spells worsen [name] patient he could certainly these inflammatory probable drop gallstones from his cholecystectomy and thought he was having recurrent episodes of little abscesses were attained a rent in the drops gallstones within his abdomen. We obtained a PET scan the PET scan showed a inflammatory process at the 2 areas that we could see on the CT but also in the ileocecal valve area and we had a colonoscopy performed Dr. 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 [name] who performed a colonoscopy recommended that we proceed with a right colectomy as well as removal of the drop gallstones The patient underwent a right colectomy open and had 2 areas of ventricular fibrillation and one in the right lateral subcostal area from his open cholecystectomy from his arthroscopic cholecystectomy and the other in the proximal side area where the operative permit had been placed to Dr. [name] will remove numerous drop gallstones the lesion in his cecum however was benign and there was no evidence of any abnormalities and numerous lymph nodes removed in the right  colectomy specimen  The patient is now over a year afterwards he is having episodes account to cramping abdominal pain no evidence of any fever and whether this process is a inflammatory process constipation which is what we are not sure. On the right the previous episodes of pain when he would have fever and acute inflammatory-type symptoms he is having nothing myalgias, cramping and abdominal pain after eating but has been noted on physical exam to have a little area of a incisional hernia right in the medial aspect of his right transverse incision The patient had a CT in January that showed no evidence of any acute inflammatory process it did show moderate constipation he's had a followup colonoscopy by Dr. [name] that did not show any other evidence of any polyps or lesions within his transverse or left colon except that he has mild diverticulosis. Patient was aware that I was planning to retire and does want [place] to repair his small umbilical small incisional hernia Proxima retirement  Review of Systems     Objective:   Physical Exam On physical exam that day the patient's vital signs are normal he has no abdominal tenderness his bowel sounds are normal not hyperactive and states he has had 2 bowel movements earlier today on physical exam he has a small hernia about size of a ping-pong ball just above the medial aspect of his right transverse incision you cannot feel any weakness in the lateral aspect of the incision even though on the CT there is a small incisional hernia laterally also. On rectal exam his stools are  Hemoccult negative if they are not hard but even though he's had 2 bowel movements previously today there is still a moderate amount of stool in his colon    Assessment:        Plan:     The patient has a planned vacation Y. and late October and I want him to do the following first I wanted to take MiraLax on a daily basis daily take it in the morning in addition to his  regular diet he has reviewed the CTs were then understands he has a chronic problem with constipation since he is having no acute symptoms of pain fever or inflammatory type process I don't think there is any evidence of a small recurrent abscess at this time. Once difficult with Mr. [name]. TUR A-line and is at even when he had the inflammatory process he does not run a chronic elevated white count or significant fever  Since he does have these 2 small incisional hernias we will need to use mesh and whether we can use a plastic mesh will need a biological mesh I'm not sure I would recommend that he take the MiraLax b received him in approximately 6 weeks and at that time all to reexamine the patient and see if he is having any symptoms. If he is having symptoms we'll plan on repeat in the CT prior to his surgery and whether this will be an open incisional hernia  Laparoscopic repair will be determined when I re\re cystoscopy examining him in 6 weeks he is in agreement with this if he does have an episode of acute pain or have what he thinks is afebrile hemodynamically documented temperature and return to see me soonerWhen I see him in the followup visit and approximate 6 weeks we'll schedule him for surgery at that date but will schedule it at the returns from his vacation to the OR he

## 2011-02-11 ENCOUNTER — Encounter (INDEPENDENT_AMBULATORY_CARE_PROVIDER_SITE_OTHER): Payer: Self-pay | Admitting: General Surgery

## 2011-02-12 ENCOUNTER — Other Ambulatory Visit (INDEPENDENT_AMBULATORY_CARE_PROVIDER_SITE_OTHER): Payer: Self-pay | Admitting: General Surgery

## 2011-02-12 ENCOUNTER — Ambulatory Visit (INDEPENDENT_AMBULATORY_CARE_PROVIDER_SITE_OTHER): Payer: Medicare Other | Admitting: General Surgery

## 2011-02-12 ENCOUNTER — Encounter (INDEPENDENT_AMBULATORY_CARE_PROVIDER_SITE_OTHER): Payer: Self-pay | Admitting: General Surgery

## 2011-02-12 VITALS — BP 164/92 | HR 48 | Temp 97.6°F | Ht 72.0 in | Wt 203.0 lb

## 2011-02-12 DIAGNOSIS — L02211 Cutaneous abscess of abdominal wall: Secondary | ICD-10-CM

## 2011-02-12 DIAGNOSIS — R0781 Pleurodynia: Secondary | ICD-10-CM

## 2011-02-12 DIAGNOSIS — R079 Chest pain, unspecified: Secondary | ICD-10-CM

## 2011-02-12 DIAGNOSIS — R109 Unspecified abdominal pain: Secondary | ICD-10-CM

## 2011-02-12 DIAGNOSIS — K432 Incisional hernia without obstruction or gangrene: Secondary | ICD-10-CM

## 2011-02-12 NOTE — Patient Instructions (Signed)
Lab studies and then a CAT scan of the abdomen and pelvis when you return from vacation. We will schedule your surgery have to discussed with you her findings of these test at your next visit. No restrictions and diet or activity at this time

## 2011-02-12 NOTE — Progress Notes (Signed)
Subjective:     Patient ID: Jose Duarte, male   DOB: 1941/04/19, 70 y.o.   MRN: [id number]  HPIMr. Duarte returns he is now approximate 5 years after he had an emergency cholecystectomy by Dr. Selena Batten and he continued to have recurrent episodes a right lower chest wall that would last a week or 2 and gradually subside. I first saw him in 2011 after he had had a CT at the urologist office and it showed an inflammatory process at the right lower rib cage that was biopsied and I thought possibly the was a tumor however there was a little calcification in the area and a previous x-ray done ordered by Dr. [name] had shown that there were little calcifications in this location and also in the sub-xiphoid area consistent with drop gallstones. With the findings of a possible tumor a PET scan had been ordered and a PET scan had shown diseased 2 areas but also an area of the ileocecal valve area. 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 Addendum to a right transverse incision Dr. Gerrit Friends assisted and we did find to 3 gallstones in the inflammatory process in the subxiphoid area. In the right subphrenic area we could see an inflammatory process that we opened up to where never identified any gallstones in that area. Postoperatively he did satisfactory the right colon the tumor was not malignant and he seemed to have no pain for approximately 6 months or so. Then he started having a right subcostal pain again a repeat CT showed no evidence of any stones no evidence of any inflammatory process and this has gone occurred off and on. He's also developed to the weakness is one at the lateral aspect of the transverse and another one medially there are small incisional hernias and they will occasionally be larger and mildly symptomatic but  this pain in the right subphrenic area is similar to the original pain first saw the patient. He has had a repeat colonoscopy and upper endoscopy Dr. Jacky Kindle with no findings of any adenopathy polyps or issues on this exam the patient has a scheduled medication Y. Yu in September and wants to get his hernias repaired after he returns still concerned about is there is still a low-grade infection off and on and think that a repeat CT of the abdomen and pelvis should be performed first if there is an area of inflammation or stone that we did not previously retrieve then I would very be hernia repairs to hopefully repair all problems simultaneously.  Review of Systems Past Medical History  Diagnosis Date  . GERD (gastroesophageal reflux disease)   . Fatigue   . Chronic kidney disease   . SOB (shortness of breath)   . Night sweats   . Hyperlipidemia   . Hiatal hernia   . Stroke     TIA  . Hypertension   . Arthritis   . Abdominal pain    Current Outpatient Prescriptions  Medication Sig Dispense Refill  . atenolol-chlorthalidone (TENORETIC) 100-25 MG per tablet       . L-Methylfolate-B6-B12 (METANX PO) Take by mouth.        . pantoprazole (PROTONIX) 40 MG tablet Take 40 mg by mouth daily.         Allergies  Allergen  Reactions  . Penicillins Anaphylaxis   The patient is status he's had some back pain off and on and recently he is closing in his car port and was having pain in the back of the rib cage areas that did not have any fever or acute symptoms. His pain however that he describes is very localized for mechanical back problem in my opinion     Objective:   Physical ExamBP 164/92  Pulse 48  Temp(Src) 97.6 F (36.4 C) (Temporal)  Ht 6' (1.829 m)  Wt 203 lb (92.08 kg)  BMI 27.53 kg/m2  Patient's lungs are clear no shortness of breath and no definite tenderness when you palpate in the right lower lateral rib cage he does have a small incisional hernia at the medial aspect  of a right transverse incision that would strain in his inch to 2 inches in size and there is a smaller defect at the lateral aspect of this incision it is more difficult to feel. He has no abdominal generalized tenderness I did not do a rectal exam since he just had a recent colonoscopy by Dr. [name]    Assessment:     When the patient returns from vacation We are going to get a CBC C. med and a repeat CT of the abdomen and pelvis and let me see him afterwards if there is no evidence of a section where he is localizing his pain I will plan on proceeding the doing the hernia repair possibly laparoscopic possibly open and we'll make that decision when I see him on the next visit if there is a inflammatory process in the right subphrenic area not sure how that should be best approached    Plan:    Patient will get a CBC and Cmet and then CT and see me afterwards and then we will schedule his upcoming surgery he is on no dietary stricture and over physical restrictions at this time

## 2011-03-23 ENCOUNTER — Other Ambulatory Visit (INDEPENDENT_AMBULATORY_CARE_PROVIDER_SITE_OTHER): Payer: Self-pay | Admitting: General Surgery

## 2011-03-23 LAB — COMPREHENSIVE METABOLIC PANEL
ALT: 33 U/L (ref 0–53)
AST: 28 U/L (ref 0–37)
Alkaline Phosphatase: 71 U/L (ref 39–117)
BUN: 19 mg/dL (ref 6–23)
Chloride: 100 mEq/L (ref 96–112)
Creat: 1.07 mg/dL (ref 0.50–1.35)
Total Bilirubin: 0.7 mg/dL (ref 0.3–1.2)

## 2011-03-23 LAB — CBC
MCH: 32 pg (ref 26.0–34.0)
MCHC: 33.3 g/dL (ref 30.0–36.0)
MCV: 96.3 fL (ref 78.0–100.0)
Platelets: 242 10*3/uL (ref 150–400)
RDW: 13.6 % (ref 11.5–15.5)
WBC: 5.5 10*3/uL (ref 4.0–10.5)

## 2011-03-24 ENCOUNTER — Ambulatory Visit
Admission: RE | Admit: 2011-03-24 | Discharge: 2011-03-24 | Disposition: A | Discharge status: Home / Self Care | Payer: Medicare Other | Source: Ambulatory Visit | Attending: General Surgery | Admitting: General Surgery

## 2011-03-24 DIAGNOSIS — L02211 Cutaneous abscess of abdominal wall: Secondary | ICD-10-CM

## 2011-03-24 DIAGNOSIS — R109 Unspecified abdominal pain: Secondary | ICD-10-CM

## 2011-03-24 MED ORDER — IOHEXOL 300 MG/ML  SOLN
100.0000 mL | Freq: Once | INTRAMUSCULAR | Status: AC | PRN
  Administered 2011-03-24: 100 mL via INTRAVENOUS

## 2012-07-11 ENCOUNTER — Other Ambulatory Visit (INDEPENDENT_AMBULATORY_CARE_PROVIDER_SITE_OTHER): Payer: Self-pay

## 2012-07-11 ENCOUNTER — Ambulatory Visit (INDEPENDENT_AMBULATORY_CARE_PROVIDER_SITE_OTHER): Payer: Medicare Other | Admitting: General Surgery

## 2012-07-11 ENCOUNTER — Encounter (INDEPENDENT_AMBULATORY_CARE_PROVIDER_SITE_OTHER): Payer: Self-pay | Admitting: General Surgery

## 2012-07-11 VITALS — BP 110/62 | HR 52 | Temp 97.2°F | Resp 18 | Ht 72.0 in | Wt 198.6 lb

## 2012-07-11 DIAGNOSIS — K439 Ventral hernia without obstruction or gangrene: Secondary | ICD-10-CM

## 2012-07-12 NOTE — Progress Notes (Signed)
Subjective:     Patient ID: Jose Duarte, male   DOB: 16-May-1941, 72 y.o.   MRN: [id number]  HPI The patient is a 72 year old white male who was a previous patient of Dr. Annette Stable. He has previously had a cholecystectomy and partial colectomy. He was known to have a small ventral hernia in the left inguinal hernia. Since his last visit with Dr. Zachery Dakins he has had some discomfort in his right groin. He denies any nausea or vomiting. He goes back and forth between diarrhea and constipation. He also has a history of mini strokes the last one occurring in 2010. The last CT scan we have access to was in 2012.  Review of Systems  Constitutional: Negative.   HENT: Negative.   Eyes: Negative.   Respiratory: Negative.   Cardiovascular: Negative.   Gastrointestinal: Positive for abdominal pain, diarrhea and constipation.  Genitourinary: Negative.   Musculoskeletal: Negative.   Skin: Negative.   Neurological: Negative.   Hematological: Negative.   Psychiatric/Behavioral: Negative.        Objective:   Physical Exam  Constitutional: He is oriented to person, place, and time. He appears well-developed and well-nourished.  HENT:  Head: Normocephalic and atraumatic.  Eyes: Conjunctivae normal and EOM are normal. Pupils are equal, round, and reactive to light.  Neck: Normal range of motion. Neck supple.  Cardiovascular: Normal rate, regular rhythm and normal heart sounds.   Pulmonary/Chest: Effort normal and breath sounds normal.  Abdominal: Bowel sounds are normal.       There is a small reducible supraumbilical ventral hernia  Genitourinary:       There is a palpable reducible bulge in the left groin. There is no palpable bulge or impulse with straining in the right groin.  Musculoskeletal: Normal range of motion.  Neurological: He is alert and oriented to person, place, and time.  Skin: Skin is warm and dry.  Psychiatric: He has a normal mood and affect. His behavior is normal.        Assessment:     The patient has what appears to be a small supraumbilical ventral hernia as well as a left inguinal hernia. Clinically I cannot feel a right inguinal hernia although he has discomfort in this location. He also states he has had an intra-abdominal infection in the right upper abdomen in the past.    Plan:     At this point I would like to obtain an up-to-date CT scan of his abdomen and pelvis to evaluate the location of all of his hernias and to make sure he has no residual abscess or fluid collection in the right upper quadrant. We will have him return to go over the results of the study in the next couple weeks. He may be a candidate for both laparoscopic ventral and laparoscopic inguinal hernia repairs and I will talk to one of my partners that does transabdominal inguinal hernia repairs about this.

## 2012-07-15 LAB — COMPREHENSIVE METABOLIC PANEL
ALT: 25 U/L (ref 0–53)
AST: 24 U/L (ref 0–37)
Albumin: 4.6 g/dL (ref 3.5–5.2)
Alkaline Phosphatase: 71 U/L (ref 39–117)
BUN: 15 mg/dL (ref 6–23)
CO2: 33 mEq/L — ABNORMAL HIGH (ref 19–32)
Chloride: 100 mEq/L (ref 96–112)
Creat: 0.89 mg/dL (ref 0.50–1.35)
Glucose, Bld: 140 mg/dL — ABNORMAL HIGH (ref 70–99)
Total Protein: 6.8 g/dL (ref 6.0–8.3)

## 2012-07-17 ENCOUNTER — Ambulatory Visit
Admission: RE | Admit: 2012-07-17 | Discharge: 2012-07-17 | Disposition: A | Discharge status: Home / Self Care | Payer: Medicare Other | Source: Ambulatory Visit | Attending: General Surgery | Admitting: General Surgery

## 2012-07-17 DIAGNOSIS — K439 Ventral hernia without obstruction or gangrene: Secondary | ICD-10-CM

## 2012-07-17 MED ORDER — IOHEXOL 300 MG/ML  SOLN
100.0000 mL | Freq: Once | INTRAMUSCULAR | Status: AC | PRN
  Administered 2012-07-17: 100 mL via INTRAVENOUS

## 2012-07-24 ENCOUNTER — Ambulatory Visit (INDEPENDENT_AMBULATORY_CARE_PROVIDER_SITE_OTHER): Payer: Medicare Other | Admitting: General Surgery

## 2012-07-24 DIAGNOSIS — K439 Ventral hernia without obstruction or gangrene: Secondary | ICD-10-CM

## 2012-07-24 NOTE — Patient Instructions (Signed)
Will reschedule with Dr. Derrell Lolling

## 2012-07-27 ENCOUNTER — Encounter (INDEPENDENT_AMBULATORY_CARE_PROVIDER_SITE_OTHER): Payer: Self-pay | Admitting: General Surgery

## 2012-07-27 NOTE — Progress Notes (Signed)
Subjective:     Patient ID: Jose Duarte, male   DOB: 1941/04/03, 72 y.o.   MRN: [id number]  HPI The pt has a small ventral supraumbilical hernia as well as a left inguinal hernia. He has no complaints today  Review of Systems     Objective:   Physical Exam  Constitutional: He is oriented to person, place, and time. He appears well-nourished.  HENT:  Head: Normocephalic and atraumatic.  Eyes: Conjunctivae normal and EOM are normal. Pupils are equal, round, and reactive to light.  Neck: Normal range of motion. Neck supple.  Cardiovascular: Normal rate, regular rhythm and normal heart sounds.   Pulmonary/Chest: Effort normal and breath sounds normal.  Abdominal: Soft. Bowel sounds are normal.       Small ventral hernia  Genitourinary:       Left inguinal hernia  Musculoskeletal: Normal range of motion.  Neurological: He is alert and oriented to person, place, and time.  Skin: Skin is warm and dry.  Psychiatric: He has a normal mood and affect. His behavior is normal.       Assessment:     Ventral and left inguinal hernia    Plan:     At this point i think he would be a good candidate for laparoscopic repair of both hernias. i have spoken to one of my partners who does lap inguinal hernia repairs and we will repair him together once he has met Dr. Derrell Lolling

## 2012-08-01 ENCOUNTER — Telehealth (INDEPENDENT_AMBULATORY_CARE_PROVIDER_SITE_OTHER): Payer: Self-pay | Admitting: General Surgery

## 2012-08-01 NOTE — Telephone Encounter (Signed)
Called to R/S patient's appt time for 2/12 because somehow he was schd before [facility] even started and while he was in surgery but patient wanted to R/S his appt until next week because of the weather so he is now schd for 2/17

## 2012-08-02 ENCOUNTER — Encounter (INDEPENDENT_AMBULATORY_CARE_PROVIDER_SITE_OTHER): Payer: Medicare Other | Admitting: General Surgery

## 2012-08-07 ENCOUNTER — Ambulatory Visit (INDEPENDENT_AMBULATORY_CARE_PROVIDER_SITE_OTHER): Payer: Medicare Other | Admitting: General Surgery

## 2012-08-07 ENCOUNTER — Encounter (INDEPENDENT_AMBULATORY_CARE_PROVIDER_SITE_OTHER): Payer: Self-pay | Admitting: General Surgery

## 2012-08-07 VITALS — BP 140/88 | HR 54 | Temp 97.6°F | Resp 16 | Ht 71.5 in | Wt 200.8 lb

## 2012-08-07 DIAGNOSIS — K4090 Unilateral inguinal hernia, without obstruction or gangrene, not specified as recurrent: Secondary | ICD-10-CM

## 2012-08-07 DIAGNOSIS — K439 Ventral hernia without obstruction or gangrene: Secondary | ICD-10-CM

## 2012-08-07 NOTE — Progress Notes (Signed)
Patient ID: Jose Duarte, male   DOB: 03-09-1941, 72 y.o.   MRN: [id number]  Chief Complaint  Patient presents with  . Hernia    est pt new prob- eval hernia    HPI Jose Duarte is a 72 y.o. male.  The patient is a 72 year old male who is referred by Dr. Carolynne Edouard for evaluation of a left inguinal hernia and ventral hernia. Patient's had a previous right colectomy in 2011 for colon polyps. Assessment this patient has a ventral hernia in the midline. He was also diagnosed with a left inguinal hernia by Dr. Carolynne Edouard.  The patient would like laparoscopic repair of both hernias which is reasonable. HPI  Past Medical History  Diagnosis Date  . GERD (gastroesophageal reflux disease)   . Fatigue   . Chronic kidney disease   . SOB (shortness of breath)   . Night sweats   . Hyperlipidemia   . Hiatal hernia   . Stroke     TIA  . Hypertension   . Arthritis   . Abdominal pain   . Diabetes mellitus without complication     Past Surgical History  Procedure Laterality Date  . Neck surgery  1960's or 70's  . Cholecystectomy  2007  . Colon surgery  2011    Family History  Problem Relation Age of Onset  . Cancer Sister     lung    Social History History  Substance Use Topics  . Smoking status: Never Smoker   . Smokeless tobacco: Former Neurosurgeon    Types: Chew     Comment: quit 2003  . Alcohol Use: No    Allergies  Allergen Reactions  . Penicillins Anaphylaxis    Current Outpatient Prescriptions  Medication Sig Dispense Refill  . aspirin 81 MG tablet Take 81 mg by mouth daily.      Marland Kitchen atenolol-chlorthalidone (TENORETIC) 100-25 MG per tablet       . CRESTOR 10 MG tablet       . pantoprazole (PROTONIX) 40 MG tablet Take 40 mg by mouth daily.         No current facility-administered medications for this visit.    Review of Systems Review of Systems  Constitutional: Negative.   HENT: Negative.   Respiratory: Negative.   Cardiovascular: Negative.   Gastrointestinal:  Negative.   Neurological: Negative.     Blood pressure 140/88, pulse 54, temperature 97.6 F (36.4 C), temperature source Temporal, resp. rate 16, height 5' 11.5" (1.816 m), weight 200 lb 12.8 oz (91.082 kg).  Physical Exam Physical Exam  Constitutional: He is oriented to person, place, and time. He appears well-developed.  HENT:  Head: Normocephalic and atraumatic.  Eyes: Conjunctivae and EOM are normal. Pupils are equal, round, and reactive to light.  Neck: Normal range of motion.  Cardiovascular: Normal rate, regular rhythm and normal heart sounds.   Pulmonary/Chest: Effort normal.  Abdominal: He exhibits no mass. There is no tenderness. There is no rebound and no guarding. A hernia is present. Hernia confirmed positive in the ventral area (~3-4cm) and confirmed positive in the left inguinal area.    Musculoskeletal: Normal range of motion.  Neurological: He is oriented to person, place, and time.    Data Reviewed CT scan reveals a left angle hernia with fat filled, and he midline supraumbilical ventral hernia.  Assessment    72 year old male with a left inguinal hernia and ventral hernia.     Plan    1. We will proceed to the  operating room for a laparoscopic TAPP LIHR with mesh, as well as ventral hernia repair. I will have Dr. Carolynne Edouard to assist.  2. All risks and benefits were discussed with the patient, to generally include infection, bleeding, damage to surrounding structures, acute and chronic nerve pain, and recurrence. Alternatives were offered and described.  All questions were answered and the patient voiced understanding of the procedure and wishes to proceed at this point.        Marigene Ehlers., Lakeeta Dobosz 08/07/2012, 9:29 AM

## 2012-09-05 ENCOUNTER — Encounter (HOSPITAL_COMMUNITY): Payer: Self-pay | Admitting: Pharmacy Technician

## 2012-09-05 ENCOUNTER — Other Ambulatory Visit (INDEPENDENT_AMBULATORY_CARE_PROVIDER_SITE_OTHER): Payer: Self-pay | Admitting: General Surgery

## 2012-09-05 NOTE — Progress Notes (Signed)
We need orders in EPIC please - pt coming for preop 09/12/12 thank you

## 2012-09-11 NOTE — Patient Instructions (Signed)
Maurion Walkowiak Beeghly  09/11/2012   Your procedure is scheduled on:  09/18/12   Report to [facility] at   0515  AM.  Call this number if you have problems the morning of surgery: [phone]   Remember:   Do not eat food or drink liquids after midnight.   Take these medicines the morning of surgery with A SIP OF WATER:    Do not wear jewelry,   Do not wear lotions, powders, or perfumes.  . Men may shave face and neck.  Do not bring valuables to the hospital.  Contacts, dentures or bridgework may not be worn into surgery.      Patients discharged the day of surgery will not be allowed to drive  home.  Name and phone number of your driver:    SEE CHG INSTRUCTION SHEET    Please read over the following fact sheets that you were given: MRSA Information, coughing and deep breathing exercises, leg exercises               Failure to comply with these instructions may result in cancellation of your surgery.                Patient Signature ____________________________              Nurse Signature _____________________________

## 2012-09-12 ENCOUNTER — Encounter (HOSPITAL_COMMUNITY): Payer: Self-pay

## 2012-09-12 ENCOUNTER — Encounter (HOSPITAL_COMMUNITY)
Admission: RE | Admit: 2012-09-12 | Discharge: 2012-09-12 | Disposition: A | Discharge status: Home / Self Care | Payer: Medicare Other | Source: Ambulatory Visit | Attending: General Surgery | Admitting: General Surgery

## 2012-09-12 ENCOUNTER — Ambulatory Visit (HOSPITAL_COMMUNITY)
Admission: RE | Admit: 2012-09-12 | Discharge: 2012-09-12 | Disposition: A | Discharge status: Home / Self Care | Payer: Medicare Other | Source: Ambulatory Visit | Attending: General Surgery | Admitting: General Surgery

## 2012-09-12 DIAGNOSIS — Z01812 Encounter for preprocedural laboratory examination: Secondary | ICD-10-CM | POA: Insufficient documentation

## 2012-09-12 DIAGNOSIS — Z01818 Encounter for other preprocedural examination: Secondary | ICD-10-CM | POA: Insufficient documentation

## 2012-09-12 DIAGNOSIS — I498 Other specified cardiac arrhythmias: Secondary | ICD-10-CM | POA: Insufficient documentation

## 2012-09-12 DIAGNOSIS — R9431 Abnormal electrocardiogram [ECG] [EKG]: Secondary | ICD-10-CM | POA: Insufficient documentation

## 2012-09-12 DIAGNOSIS — Z01810 Encounter for preprocedural cardiovascular examination: Secondary | ICD-10-CM | POA: Insufficient documentation

## 2012-09-12 HISTORY — DX: Other specified postprocedural states: R11.2

## 2012-09-12 HISTORY — DX: Other specified postprocedural states: Z98.890

## 2012-09-12 LAB — BASIC METABOLIC PANEL
CO2: 30 mEq/L (ref 19–32)
Calcium: 9.6 mg/dL (ref 8.4–10.5)
Creatinine, Ser: 0.92 mg/dL (ref 0.50–1.35)
GFR calc Af Amer: >90 mL/min (ref >90)
GFR calc non Af Amer: 83 mL/min — ABNORMAL LOW (ref >90)

## 2012-09-12 LAB — CBC
MCH: 30.8 pg (ref 26.0–34.0)
MCHC: 34.1 g/dL (ref 30.0–36.0)
MCV: 90.2 fL (ref 78.0–100.0)
Platelets: 187 10*3/uL (ref 150–400)
RDW: 13.1 % (ref 11.5–15.5)

## 2012-09-12 LAB — SURGICAL PCR SCREEN: MRSA, PCR: NEGATIVE

## 2012-09-12 NOTE — Progress Notes (Signed)
Requested from office of Dr [name] (PCP) most recent office visit note of 08/23/12 along with most recent ekg which per office is dated 2010.  Patient has appointment with them on 09/13/12 per office staff.  Made them be aware that patient was asymptomatic but heart rate was 43 on EKG and 48 on dinemapp.  EKG done at preop appointment was faxed and confirmation received to Dr Shelah Lewandowsky.

## 2012-09-15 NOTE — Progress Notes (Addendum)
Left message on home phone of patient at [phone] to call with any updates regarding medical history or medications since PCP office visit with Dr [name] on 09/13/12.  Office visit note is on chart but unable  To determine if any medicaiton changes or updates due to heart rate was 43 on EKG and patient is on betablocker.

## 2012-09-18 ENCOUNTER — Encounter (HOSPITAL_COMMUNITY): Payer: Self-pay | Admitting: *Deleted

## 2012-09-18 ENCOUNTER — Encounter (HOSPITAL_COMMUNITY): Payer: Self-pay | Admitting: Anesthesiology

## 2012-09-18 ENCOUNTER — Encounter (HOSPITAL_COMMUNITY)
Admission: RE | Disposition: A | Discharge status: Home / Self Care | Payer: Self-pay | Source: Ambulatory Visit | Attending: General Surgery

## 2012-09-18 ENCOUNTER — Ambulatory Visit (HOSPITAL_COMMUNITY): Payer: Medicare Other | Admitting: Anesthesiology

## 2012-09-18 ENCOUNTER — Ambulatory Visit (HOSPITAL_COMMUNITY)
Admission: RE | Admit: 2012-09-18 | Discharge: 2012-09-18 | Disposition: A | Discharge status: Home / Self Care | Payer: Medicare Other | Source: Ambulatory Visit | Attending: General Surgery | Admitting: General Surgery

## 2012-09-18 DIAGNOSIS — K449 Diaphragmatic hernia without obstruction or gangrene: Secondary | ICD-10-CM | POA: Insufficient documentation

## 2012-09-18 DIAGNOSIS — K4090 Unilateral inguinal hernia, without obstruction or gangrene, not specified as recurrent: Secondary | ICD-10-CM

## 2012-09-18 DIAGNOSIS — K219 Gastro-esophageal reflux disease without esophagitis: Secondary | ICD-10-CM | POA: Insufficient documentation

## 2012-09-18 DIAGNOSIS — N189 Chronic kidney disease, unspecified: Secondary | ICD-10-CM | POA: Insufficient documentation

## 2012-09-18 DIAGNOSIS — Z7982 Long term (current) use of aspirin: Secondary | ICD-10-CM | POA: Insufficient documentation

## 2012-09-18 DIAGNOSIS — Z87891 Personal history of nicotine dependence: Secondary | ICD-10-CM | POA: Insufficient documentation

## 2012-09-18 DIAGNOSIS — K432 Incisional hernia without obstruction or gangrene: Secondary | ICD-10-CM | POA: Insufficient documentation

## 2012-09-18 DIAGNOSIS — E119 Type 2 diabetes mellitus without complications: Secondary | ICD-10-CM | POA: Insufficient documentation

## 2012-09-18 DIAGNOSIS — M129 Arthropathy, unspecified: Secondary | ICD-10-CM | POA: Insufficient documentation

## 2012-09-18 DIAGNOSIS — Z79899 Other long term (current) drug therapy: Secondary | ICD-10-CM | POA: Insufficient documentation

## 2012-09-18 DIAGNOSIS — E785 Hyperlipidemia, unspecified: Secondary | ICD-10-CM | POA: Insufficient documentation

## 2012-09-18 DIAGNOSIS — Z8673 Personal history of transient ischemic attack (TIA), and cerebral infarction without residual deficits: Secondary | ICD-10-CM | POA: Insufficient documentation

## 2012-09-18 DIAGNOSIS — I129 Hypertensive chronic kidney disease with stage 1 through stage 4 chronic kidney disease, or unspecified chronic kidney disease: Secondary | ICD-10-CM | POA: Insufficient documentation

## 2012-09-18 HISTORY — PX: INGUINAL HERNIA REPAIR: SHX194

## 2012-09-18 HISTORY — PX: INSERTION OF MESH: SHX5868

## 2012-09-18 HISTORY — PX: VENTRAL HERNIA REPAIR: SHX424

## 2012-09-18 HISTORY — PX: LAPAROSCOPIC LYSIS OF ADHESIONS: SHX5905

## 2012-09-18 LAB — GLUCOSE, CAPILLARY: Glucose-Capillary: 138 mg/dL — ABNORMAL HIGH (ref 70–99)

## 2012-09-18 SURGERY — REPAIR, HERNIA, INGUINAL, LAPAROSCOPIC
Anesthesia: General | Site: Abdomen | Wound class: Clean

## 2012-09-18 MED ORDER — SODIUM CHLORIDE 0.9 % IV SOLN: 250.0000 mL | INTRAVENOUS | Status: DC | PRN

## 2012-09-18 MED ORDER — PROMETHAZINE HCL 25 MG/ML IJ SOLN: 6.2500 mg | INTRAMUSCULAR | Status: DC | PRN

## 2012-09-18 MED ORDER — GLYCOPYRROLATE 0.2 MG/ML IJ SOLN
INTRAMUSCULAR | Status: DC | PRN
  Administered 2012-09-18: .6 mg via INTRAVENOUS

## 2012-09-18 MED ORDER — KETOROLAC TROMETHAMINE 30 MG/ML IJ SOLN: 15.0000 mg | Freq: Once | INTRAMUSCULAR | Status: DC | PRN

## 2012-09-18 MED ORDER — ACETAMINOPHEN 10 MG/ML IV SOLN
INTRAVENOUS | Status: DC | PRN
  Administered 2012-09-18: 1000 mg via INTRAVENOUS

## 2012-09-18 MED ORDER — FENTANYL CITRATE 0.05 MG/ML IJ SOLN
INTRAMUSCULAR | Status: DC | PRN
  Administered 2012-09-18: 100 ug via INTRAVENOUS
  Administered 2012-09-18 (×3): 50 ug via INTRAVENOUS

## 2012-09-18 MED ORDER — ROCURONIUM BROMIDE 100 MG/10ML IV SOLN
INTRAVENOUS | Status: DC | PRN
  Administered 2012-09-18: 10 mg via INTRAVENOUS
  Administered 2012-09-18: 30 mg via INTRAVENOUS
  Administered 2012-09-18 (×2): 10 mg via INTRAVENOUS

## 2012-09-18 MED ORDER — LACTATED RINGERS IV SOLN
INTRAVENOUS | Status: DC | PRN
  Administered 2012-09-18: 07:00:00 via INTRAVENOUS

## 2012-09-18 MED ORDER — SODIUM CHLORIDE 0.9 % IJ SOLN: 3.0000 mL | INTRAMUSCULAR | Status: DC | PRN

## 2012-09-18 MED ORDER — ONDANSETRON HCL 4 MG/2ML IJ SOLN
INTRAMUSCULAR | Status: DC | PRN
  Administered 2012-09-18: 4 mg via INTRAVENOUS

## 2012-09-18 MED ORDER — SODIUM CHLORIDE 0.9 % IJ SOLN: 3.0000 mL | Freq: Two times a day (BID) | INTRAMUSCULAR | Status: DC

## 2012-09-18 MED ORDER — KETOROLAC TROMETHAMINE 15 MG/ML IJ SOLN
INTRAMUSCULAR | Status: AC
  Administered 2012-09-18: 15 mg
  Filled 2012-09-18: qty 1

## 2012-09-18 MED ORDER — VANCOMYCIN HCL IN DEXTROSE 1-5 GM/200ML-% IV SOLN
INTRAVENOUS | Status: AC
  Filled 2012-09-18: qty 200

## 2012-09-18 MED ORDER — SODIUM CHLORIDE 0.9 % IR SOLN
Status: DC | PRN
  Administered 2012-09-18: 1000 mL

## 2012-09-18 MED ORDER — SUCCINYLCHOLINE CHLORIDE 20 MG/ML IJ SOLN
INTRAMUSCULAR | Status: DC | PRN
  Administered 2012-09-18: 100 mg via INTRAVENOUS

## 2012-09-18 MED ORDER — PROPOFOL 10 MG/ML IV BOLUS
INTRAVENOUS | Status: DC | PRN
  Administered 2012-09-18: 150 mg via INTRAVENOUS

## 2012-09-18 MED ORDER — OXYCODONE HCL 5 MG PO TABS
5.0000 mg | ORAL_TABLET | ORAL | Status: DC | PRN
  Administered 2012-09-18: 5 mg via ORAL
  Filled 2012-09-18: qty 1

## 2012-09-18 MED ORDER — OXYCODONE-ACETAMINOPHEN 10-325 MG PO TABS: 1.0000 | ORAL_TABLET | ORAL | Status: DC | PRN

## 2012-09-18 MED ORDER — BUPIVACAINE-EPINEPHRINE 0.25% -1:200000 IJ SOLN
INTRAMUSCULAR | Status: DC | PRN
  Administered 2012-09-18: 10 mL

## 2012-09-18 MED ORDER — BUPIVACAINE-EPINEPHRINE 0.25% -1:200000 IJ SOLN
INTRAMUSCULAR | Status: AC
  Filled 2012-09-18: qty 1

## 2012-09-18 MED ORDER — METOCLOPRAMIDE HCL 5 MG/ML IJ SOLN
INTRAMUSCULAR | Status: DC | PRN
  Administered 2012-09-18: 10 mg via INTRAVENOUS

## 2012-09-18 MED ORDER — VANCOMYCIN HCL 1000 MG IV SOLR
1000.0000 mg | Freq: Once | INTRAVENOUS | Status: DC
  Administered 2012-09-18: 1000 mg via INTRAVENOUS

## 2012-09-18 MED ORDER — VANCOMYCIN HCL IN DEXTROSE 1-5 GM/200ML-% IV SOLN: 1000.0000 mg | Freq: Once | INTRAVENOUS | Status: DC

## 2012-09-18 MED ORDER — FENTANYL CITRATE 0.05 MG/ML IJ SOLN
25.0000 ug | INTRAMUSCULAR | Status: DC | PRN
  Administered 2012-09-18 (×3): 50 ug via INTRAVENOUS

## 2012-09-18 MED ORDER — ACETAMINOPHEN 10 MG/ML IV SOLN
INTRAVENOUS | Status: AC
  Filled 2012-09-18: qty 100

## 2012-09-18 MED ORDER — DEXAMETHASONE SODIUM PHOSPHATE 10 MG/ML IJ SOLN
INTRAMUSCULAR | Status: DC | PRN
  Administered 2012-09-18: 10 mg via INTRAVENOUS

## 2012-09-18 MED ORDER — FENTANYL CITRATE 0.05 MG/ML IJ SOLN
INTRAMUSCULAR | Status: AC
  Filled 2012-09-18: qty 2

## 2012-09-18 MED ORDER — CEFAZOLIN SODIUM-DEXTROSE 2-3 GM-% IV SOLR: 2.0000 g | INTRAVENOUS | Status: DC

## 2012-09-18 MED ORDER — NEOSTIGMINE METHYLSULFATE 1 MG/ML IJ SOLN
INTRAMUSCULAR | Status: DC | PRN
  Administered 2012-09-18: 4 mg via INTRAVENOUS

## 2012-09-18 MED ORDER — ACETAMINOPHEN 325 MG PO TABS: 650.0000 mg | ORAL_TABLET | ORAL | Status: DC | PRN

## 2012-09-18 MED ORDER — ACETAMINOPHEN 650 MG RE SUPP
650.0000 mg | RECTAL | Status: DC | PRN
  Filled 2012-09-18: qty 1

## 2012-09-18 MED ORDER — MORPHINE SULFATE 10 MG/ML IJ SOLN: 2.0000 mg | INTRAMUSCULAR | Status: DC | PRN

## 2012-09-18 MED ORDER — ONDANSETRON HCL 4 MG/2ML IJ SOLN: 4.0000 mg | Freq: Four times a day (QID) | INTRAMUSCULAR | Status: DC | PRN

## 2012-09-18 SURGICAL SUPPLY — 54 items
APL SKNCLS STERI-STRIP NONHPOA (GAUZE/BANDAGES/DRESSINGS) ×1
APPLIER CLIP 5 13 M/L LIGAMAX5 (MISCELLANEOUS)
APR CLP MED LRG 5 ANG JAW (MISCELLANEOUS)
BENZOIN TINCTURE PRP APPL 2/3 (GAUZE/BANDAGES/DRESSINGS) ×3 IMPLANT
BINDER ABD UNIV 12 45-62 (WOUND CARE) ×2 IMPLANT
BINDER ABDOMINAL 46IN 62IN (WOUND CARE) ×3
CABLE HIGH FREQUENCY MONO STRZ (ELECTRODE) ×3 IMPLANT
CANISTER SUCTION 2500CC (MISCELLANEOUS) ×3 IMPLANT
CHLORAPREP W/TINT 26ML (MISCELLANEOUS) ×3 IMPLANT
CLIP APPLIE 5 13 M/L LIGAMAX5 (MISCELLANEOUS) IMPLANT
CLOTH BEACON ORANGE TIMEOUT ST (SAFETY) ×3 IMPLANT
DECANTER SPIKE VIAL GLASS SM (MISCELLANEOUS) ×3 IMPLANT
DEVICE PMI PUNCTURE CLOSURE (MISCELLANEOUS) ×3 IMPLANT
DEVICE SECURE STRAP 25 ABSORB (INSTRUMENTS) ×6 IMPLANT
DEVICE TROCAR PUNCTURE CLOSURE (ENDOMECHANICALS) ×3 IMPLANT
DRAIN CHANNEL 19F RND (DRAIN) IMPLANT
DRAPE LAPAROSCOPIC ABDOMINAL (DRAPES) ×3 IMPLANT
DRAPE UTILITY XL STRL (DRAPES) ×3 IMPLANT
DRAPE WARM FLUID 44X44 (DRAPE) IMPLANT
ELECT REM PT RETURN 9FT ADLT (ELECTROSURGICAL) ×3
ELECTRODE REM PT RTRN 9FT ADLT (ELECTROSURGICAL) ×2 IMPLANT
GLOVE BIO SURGEON STRL SZ7.5 (GLOVE) ×12 IMPLANT
GLOVE BIOGEL PI IND STRL 7.0 (GLOVE) ×2 IMPLANT
GLOVE BIOGEL PI INDICATOR 7.0 (GLOVE) ×1
GOWN PREVENTION PLUS XLARGE (GOWN DISPOSABLE) IMPLANT
GOWN STRL NON-REIN LRG LVL3 (GOWN DISPOSABLE) ×3 IMPLANT
GOWN STRL REIN XL XLG (GOWN DISPOSABLE) ×9 IMPLANT
HAND ACTIVATED (MISCELLANEOUS) IMPLANT
KIT BASIN OR (CUSTOM PROCEDURE TRAY) ×3 IMPLANT
MARKER SKIN DUAL TIP RULER LAB (MISCELLANEOUS) ×3 IMPLANT
MESH PARIETEX 20X20CM (Mesh General) ×3 IMPLANT
MESH PARIETEX 4.7 (Mesh General) ×3 IMPLANT
NEEDLE INSUFFLATION 14GA 120MM (NEEDLE) ×3 IMPLANT
NEEDLE SPNL 22GX3.5 QUINCKE BK (NEEDLE) ×3 IMPLANT
NS IRRIG 1000ML POUR BTL (IV SOLUTION) ×3 IMPLANT
PENCIL BUTTON HOLSTER BLD 10FT (ELECTRODE) IMPLANT
RELOAD STAPLE HERNIA 4.0 BLUE (INSTRUMENTS) ×6 IMPLANT
SCISSORS LAP 5X35 DISP (ENDOMECHANICALS) ×3 IMPLANT
SET IRRIG TUBING LAPAROSCOPIC (IRRIGATION / IRRIGATOR) IMPLANT
SLEEVE XCEL OPT CAN 5 100 (ENDOMECHANICALS) ×6 IMPLANT
SOLUTION ANTI FOG 6CC (MISCELLANEOUS) ×3 IMPLANT
STAPLER HERNIA 12 8.5 360D (INSTRUMENTS) ×3 IMPLANT
STRIP CLOSURE SKIN 1/2X4 (GAUZE/BANDAGES/DRESSINGS) ×3 IMPLANT
SUT CHROMIC 2 0 SH (SUTURE) ×3 IMPLANT
SUT MNCRL AB 4-0 PS2 18 (SUTURE) ×6 IMPLANT
SYRINGE IRR TOOMEY STRL 70CC (SYRINGE) IMPLANT
TOWEL OR 17X26 10 PK STRL BLUE (TOWEL DISPOSABLE) ×3 IMPLANT
TRAY FOLEY CATH 14FRSI W/METER (CATHETERS) ×3 IMPLANT
TRAY LAP CHOLE (CUSTOM PROCEDURE TRAY) ×3 IMPLANT
TROCAR CANNULA W/PORT DUAL 5MM (MISCELLANEOUS) ×3 IMPLANT
TROCAR XCEL 12X100 BLDLESS (ENDOMECHANICALS) ×3 IMPLANT
TROCAR XCEL BLUNT TIP 100MML (ENDOMECHANICALS) IMPLANT
TROCAR XCEL NON-BLD 11X100MML (ENDOMECHANICALS) IMPLANT
TUBING INSUFFLATION 10FT LAP (TUBING) ×3 IMPLANT

## 2012-09-18 NOTE — Op Note (Signed)
Pre Operative Diagnosis:  LIH and incisional hernia  Post Operative Diagnosis: same  Procedure: Lap IHR (TAPP) and lap incisional hernia repair with mesh  Surgeon: Dr. Axel Filler  Assistant: Dr. Carolynne Edouard  Anesthesia: GETA  EBL: 10cc  Complications: none  Counts: reported as correct x 2  Findings:  Pt had a indirect IHR. Pt had a 2cm umb incisional hernia  Indications for procedure:  Pt is a 72 y/o M who had previously had a R hemicolectomy and cholecystectomy.  He was diagnosed with a LIH and incisional hernia.  He was seen in clinic and counseled to have both fixed.  He agreed and consented to Lap LIHR and IHR with mesh.  Details of the procedure:The patient was taken back to the operating room. The patient was placed in supine position with bilateral SCDs in place. After appropriate anitbiotics were confirmed, a time-out was confirmed and all facts were verified.  A pneumoperitoneum of 14 mmHg was obtained the additional kidney in the left subcostal margin. A large amount of adhesions to the midline as well as right lower quadrant area. At this time cutters used to make hemostasis and the adhesions to anterior abdominal wall which were mainly omental adhesions were taken down sharply.  Once these were taken down a left lower quadrant 5 mmport was then placed as was a right lower quadrant a 5 mm port. A 12 mm port was placed at the area of the incisional hernia in the midline. At this point the peritoneum was taken down left lateral portion of the abdomen using Bovie cautery. Blunt dissection was taken down towards Cooper's ligament and towards hernia. The hernia was reduced in its entirety. Once this was done a piece of TET 20 x 20 cm x 6 mesh was cut to shape and placed to cover the hernia defect this.   This was anchored to Cooper's ligament using 4.0 mm Universal Covieden hernia stapler. It was then anchored to the anterior abdominal wall using 4.8 mm staples.  The peritoneum was  reapproximated using a 4.8 mm staplers. At this point the 12 mm port was then removed and the hernia site was reapproximated using a #1 Vicryl Endo Close device in interrupted fashion x1. A piece of PCOof 12 cm mesh was then placed extremity incisional hernia defect.  This was brought to the anterior abdominal wall using a previously placed 2-0 chromic stitch with the Endo Close device.  A SecureStrand device was then used to circumferentially tack the mesh in place.  The omentum was brought over both hernia defect areas,  And the pneumoperitoneum was evacuated. The patient was taken to the recovery room in stable condition.

## 2012-09-18 NOTE — Anesthesia Postprocedure Evaluation (Signed)
  Anesthesia Post-op Note  Patient: Jose Duarte  Procedure(s) Performed: Procedure(s) (LRB): LAPAROSCOPIC INGUINAL HERNIA (Left) LAPAROSCOPIC VENTRAL HERNIA (N/A) INSERTION OF MESH (Left) LAPAROSCOPIC LYSIS OF ADHESIONS (N/A)  Patient Location: PACU  Anesthesia Type: General  Level of Consciousness: awake and alert   Airway and Oxygen Therapy: Patient Spontanous Breathing  Post-op Pain: mild  Post-op Assessment: Post-op Vital signs reviewed, Patient's Cardiovascular Status Stable, Respiratory Function Stable, Patent Airway and No signs of Nausea or vomiting  Last Vitals:  Filed Vitals:   09/18/12 1000  BP: 173/88  Pulse: 62  Temp:   Resp: 15    Post-op Vital Signs: stable   Complications: No apparent anesthesia complications

## 2012-09-18 NOTE — Preoperative (Signed)
Beta Blockers   Reason not to administer Beta Blockers:Not Applicable discontinued

## 2012-09-18 NOTE — Transfer of Care (Signed)
Immediate Anesthesia Transfer of Care Note  Patient: Jose Duarte  Procedure(s) Performed: Procedure(s): LAPAROSCOPIC INGUINAL HERNIA (Left) LAPAROSCOPIC VENTRAL HERNIA (N/A) INSERTION OF MESH (Left) LAPAROSCOPIC LYSIS OF ADHESIONS (N/A)  Patient Location: PACU  Anesthesia Type:General  Level of Consciousness: awake, alert  and patient cooperative  Airway & Oxygen Therapy: Patient Spontanous Breathing and Patient connected to face mask oxygen  Post-op Assessment: Report given to PACU RN and Post -op Vital signs reviewed and stable  Post vital signs: Reviewed and stable  Complications: No apparent anesthesia complications

## 2012-09-18 NOTE — Progress Notes (Signed)
Dr. Okey Dupre notified of patient's blood pressures and Aldrete scores.

## 2012-09-18 NOTE — Anesthesia Preprocedure Evaluation (Signed)
Anesthesia Evaluation  Patient identified by MRN, date of birth, ID band Patient awake    Reviewed: Allergy & Precautions, H&P , NPO status , Patient's Chart, lab work & pertinent test results  Airway Mallampati: II TM Distance: >3 FB Neck ROM: Full    Dental no notable dental hx.    Pulmonary neg pulmonary ROS,  breath sounds clear to auscultation  Pulmonary exam normal       Cardiovascular hypertension, Pt. on medications Rhythm:Regular Rate:Normal     Neuro/Psych negative neurological ROS  negative psych ROS   GI/Hepatic Neg liver ROS, GERD-  Medicated,  Endo/Other  diabetes  Renal/GU negative Renal ROS  negative genitourinary   Musculoskeletal negative musculoskeletal ROS (+)   Abdominal   Peds negative pediatric ROS (+)  Hematology negative hematology ROS (+)   Anesthesia Other Findings   Reproductive/Obstetrics negative OB ROS                           Anesthesia Physical Anesthesia Plan  ASA: II  Anesthesia Plan: General   Post-op Pain Management:    Induction: Intravenous  Airway Management Planned: Oral ETT  Additional Equipment:   Intra-op Plan:   Post-operative Plan: Extubation in OR  Informed Consent: I have reviewed the patients History and Physical, chart, labs and discussed the procedure including the risks, benefits and alternatives for the proposed anesthesia with the patient or authorized representative who has indicated his/her understanding and acceptance.   Dental advisory given  Plan Discussed with: CRNA and Surgeon  Anesthesia Plan Comments:         Anesthesia Quick Evaluation

## 2012-09-18 NOTE — H&P (Signed)
HPI  Jose Duarte is a 72 y.o. male. The patient is a 72 year old male who is referred by Dr. Carolynne Edouard for evaluation of a left inguinal hernia and ventral hernia. Patient's had a previous right colectomy in 2011 for colon polyps. Assessment this patient has a ventral hernia in the midline. He was also diagnosed with a left inguinal hernia by Dr. Carolynne Edouard. The patient would like laparoscopic repair of both hernias which is reasonable.  HPI  Past Medical History   Diagnosis  Date   .  GERD (gastroesophageal reflux disease)    .  Fatigue    .  Chronic kidney disease    .  SOB (shortness of breath)    .  Night sweats    .  Hyperlipidemia    .  Hiatal hernia    .  Stroke      TIA   .  Hypertension    .  Arthritis    .  Abdominal pain    .  Diabetes mellitus without complication     Past Surgical History   Procedure  Laterality  Date   .  Neck surgery   1960's or 70's   .  Cholecystectomy   2007   .  Colon surgery   2011    Family History   Problem  Relation  Age of Onset   .  Cancer  Sister      lung   Social History  History   Substance Use Topics   .  Smoking status:  Never Smoker   .  Smokeless tobacco:  Former Neurosurgeon     Types:  Chew      Comment: quit 2003   .  Alcohol Use:  No    Allergies   Allergen  Reactions   .  Penicillins  Anaphylaxis    Current Outpatient Prescriptions   Medication  Sig  Dispense  Refill   .  aspirin 81 MG tablet  Take 81 mg by mouth daily.     Marland Kitchen  atenolol-chlorthalidone (TENORETIC) 100-25 MG per tablet      .  CRESTOR 10 MG tablet      .  pantoprazole (PROTONIX) 40 MG tablet  Take 40 mg by mouth daily.      No current facility-administered medications for this visit.   Review of Systems  Review of Systems  Constitutional: Negative.  HENT: Negative.  Respiratory: Negative.  Cardiovascular: Negative.  Gastrointestinal: Negative.  Neurological: Negative.  Blood pressure 140/88, pulse 54, temperature 97.6 F (36.4 C), temperature source  Temporal, resp. rate 16, height 5' 11.5" (1.816 m), weight 200 lb 12.8 oz (91.082 kg).  Physical Exam  Physical Exam  Constitutional: He is oriented to person, place, and time. He appears well-developed.  HENT:  Head: Normocephalic and atraumatic.  Eyes: Conjunctivae and EOM are normal. Pupils are equal, round, and reactive to light.  Neck: Normal range of motion.  Cardiovascular: Normal rate, regular rhythm and normal heart sounds.  Pulmonary/Chest: Effort normal.  Abdominal: He exhibits no mass. There is no tenderness. There is no rebound and no guarding. A hernia is present. Hernia confirmed positive in the ventral area (~3-4cm) and confirmed positive in the left inguinal area.    Musculoskeletal: Normal range of motion.  Neurological: He is oriented to person, place, and time.  Data Reviewed  CT scan reveals a left angle hernia with fat filled, and he midline supraumbilical ventral hernia.  Assessment  72 year old male with a left  inguinal hernia and ventral hernia.  Plan  1. We will proceed to the operating room for a laparoscopic TAPP LIHR with mesh, as well as ventral hernia repair. I will have Dr. Carolynne Edouard to assist.  2. All risks and benefits were discussed with the patient, to generally include infection, bleeding, damage to surrounding structures, acute and chronic nerve pain, and recurrence. Alternatives were offered and described. All questions were answered and the patient voiced understanding of the procedure and wishes to proceed at this point.

## 2012-09-19 ENCOUNTER — Encounter (HOSPITAL_COMMUNITY): Payer: Self-pay | Admitting: General Surgery

## 2012-09-20 ENCOUNTER — Telehealth (INDEPENDENT_AMBULATORY_CARE_PROVIDER_SITE_OTHER): Payer: Self-pay | Admitting: *Deleted

## 2012-09-20 NOTE — Telephone Encounter (Signed)
Wife called to state patient has no had a bowel movement since surgery.  Patient has only taken 1 dose of Milk of Mag.  Instructed patient to increase frequency of taking Milk of Mag.  Encouraged patient to drink plenty of fluids.  They will call back tomorrow if patient still has not had a bowel movement tomorrow afternoon.

## 2012-10-03 ENCOUNTER — Encounter (INDEPENDENT_AMBULATORY_CARE_PROVIDER_SITE_OTHER): Payer: Medicare Other | Admitting: General Surgery

## 2012-10-03 ENCOUNTER — Encounter (INDEPENDENT_AMBULATORY_CARE_PROVIDER_SITE_OTHER): Payer: Self-pay | Admitting: General Surgery

## 2012-10-03 ENCOUNTER — Ambulatory Visit (INDEPENDENT_AMBULATORY_CARE_PROVIDER_SITE_OTHER): Payer: Medicare Other | Admitting: General Surgery

## 2012-10-03 VITALS — BP 130/78 | HR 58 | Temp 99.2°F | Resp 18 | Ht 71.0 in | Wt 197.2 lb

## 2012-10-03 DIAGNOSIS — Z09 Encounter for follow-up examination after completed treatment for conditions other than malignant neoplasm: Secondary | ICD-10-CM

## 2012-10-03 DIAGNOSIS — Z8719 Personal history of other diseases of the digestive system: Secondary | ICD-10-CM

## 2012-10-03 DIAGNOSIS — Z9889 Other specified postprocedural states: Secondary | ICD-10-CM

## 2012-10-03 MED ORDER — OXYCODONE-ACETAMINOPHEN 10-325 MG PO TABS: 1.0000 | ORAL_TABLET | Freq: Four times a day (QID) | ORAL | Status: DC | PRN

## 2012-10-03 NOTE — Progress Notes (Signed)
Patient ID: Jose Duarte, male   DOB: [birth year], 72 y.o.   MRN: [id number] The patient is a 72 year old male status post laparoscopic inguinal hernia repair and umbilical hernia repair with mesh.  Patient states he has had some pain in his left lateral abdominal wall area. He states at one point he was placed in ice on his inguinal area and removed to take ice off and felt something shift. His inguinal hernia pain has resolved. His umbilical hernia repair has not given any problems.   On exam: There is no hernia on palpation, both umbilical and inguinal  Assessment/plan: 72 year old male status post lap inguinal hernia repair and umbilical hernia repair. I believe the patient is having pain from his ilioinguinal nerve or hypogastric nerve. I think at this point this is due to retain inflammation of the mesh. This will resolve with time.  1. We will have the patient follow back up with one month

## 2012-10-13 ENCOUNTER — Encounter (INDEPENDENT_AMBULATORY_CARE_PROVIDER_SITE_OTHER): Payer: Medicare Other | Admitting: General Surgery

## 2012-11-03 ENCOUNTER — Encounter (INDEPENDENT_AMBULATORY_CARE_PROVIDER_SITE_OTHER): Payer: Self-pay | Admitting: General Surgery

## 2012-11-03 ENCOUNTER — Ambulatory Visit (INDEPENDENT_AMBULATORY_CARE_PROVIDER_SITE_OTHER): Payer: Medicare Other | Admitting: General Surgery

## 2012-11-03 VITALS — BP 126/62 | HR 60 | Temp 98.0°F | Resp 18 | Ht 71.0 in | Wt 198.0 lb

## 2012-11-03 DIAGNOSIS — Z8719 Personal history of other diseases of the digestive system: Secondary | ICD-10-CM

## 2012-11-03 DIAGNOSIS — Z9889 Other specified postprocedural states: Secondary | ICD-10-CM

## 2012-11-03 NOTE — Progress Notes (Signed)
Patient ID: Jose Duarte, male   DOB: [birth year], 72 y.o.   MRN: [id number] The patient is a 72 year old male status post left hernia repair and umbilical hernia repair. Patient is doing well up to the previous week. He states he is doing gardening work and felt upper incision thereafter for the past week.  The patient states that he only has pain uncertain movements of his body.  On exam: Wounds are clean dry and intact There is no hernia on palpation  Assessment and plan 72 year old male status post ventral and inguinal hernia repair. We'll have patient return back in one month. Should his burning pain continue we can see her before then, at that visit date of abdominal pain we will try an ilioinguinal injection.

## 2012-12-04 ENCOUNTER — Encounter (INDEPENDENT_AMBULATORY_CARE_PROVIDER_SITE_OTHER): Payer: Self-pay | Admitting: General Surgery

## 2012-12-04 ENCOUNTER — Ambulatory Visit (INDEPENDENT_AMBULATORY_CARE_PROVIDER_SITE_OTHER): Payer: Medicare Other | Admitting: General Surgery

## 2012-12-04 VITALS — BP 130/66 | HR 66 | Temp 97.2°F | Resp 18 | Ht 71.0 in | Wt 199.6 lb

## 2012-12-04 DIAGNOSIS — Z8719 Personal history of other diseases of the digestive system: Secondary | ICD-10-CM

## 2012-12-04 DIAGNOSIS — Z9889 Other specified postprocedural states: Secondary | ICD-10-CM

## 2012-12-04 NOTE — Progress Notes (Signed)
Patient ID: Jose Duarte, male   DOB: [birth year], 72 y.o.   MRN: [id number] The patient is a 72 year old male status post laparoscopic left femoral hernia and lap and hernia. The patient was previously in for his first postop visit and had some abdominal pain. He states that since resolved. He's been back to his normal activity at this time.   On exam:Wounds are cleandry and intact there is no hernia.  Assessment and plan 1. Followup when necessary

## 2013-03-01 ENCOUNTER — Inpatient Hospital Stay (HOSPITAL_COMMUNITY)
Admission: EM | Admit: 2013-03-01 | Discharge: 2013-03-04 | DRG: 247 | Disposition: A | Discharge status: Home / Self Care | Payer: Medicare Other | Attending: Cardiovascular Disease | Admitting: Cardiovascular Disease

## 2013-03-01 ENCOUNTER — Encounter (HOSPITAL_COMMUNITY): Payer: Self-pay | Admitting: *Deleted

## 2013-03-01 ENCOUNTER — Encounter (HOSPITAL_COMMUNITY)
Admission: EM | Disposition: A | Discharge status: Home / Self Care | Payer: Medicare Other | Source: Home / Self Care | Attending: Cardiovascular Disease

## 2013-03-01 ENCOUNTER — Other Ambulatory Visit: Payer: Medicare Other

## 2013-03-01 ENCOUNTER — Ambulatory Visit (HOSPITAL_COMMUNITY): Admit: 2013-03-01 | Payer: Self-pay | Admitting: Cardiovascular Disease

## 2013-03-01 DIAGNOSIS — E118 Type 2 diabetes mellitus with unspecified complications: Secondary | ICD-10-CM | POA: Diagnosis present

## 2013-03-01 DIAGNOSIS — Z955 Presence of coronary angioplasty implant and graft: Secondary | ICD-10-CM

## 2013-03-01 DIAGNOSIS — I2119 ST elevation (STEMI) myocardial infarction involving other coronary artery of inferior wall: Principal | ICD-10-CM | POA: Diagnosis present

## 2013-03-01 DIAGNOSIS — I2582 Chronic total occlusion of coronary artery: Secondary | ICD-10-CM | POA: Diagnosis present

## 2013-03-01 DIAGNOSIS — R079 Chest pain, unspecified: Secondary | ICD-10-CM

## 2013-03-01 DIAGNOSIS — I472 Ventricular tachycardia, unspecified: Secondary | ICD-10-CM | POA: Diagnosis not present

## 2013-03-01 DIAGNOSIS — I4729 Other ventricular tachycardia: Secondary | ICD-10-CM | POA: Diagnosis not present

## 2013-03-01 DIAGNOSIS — Z7902 Long term (current) use of antithrombotics/antiplatelets: Secondary | ICD-10-CM

## 2013-03-01 DIAGNOSIS — Z7982 Long term (current) use of aspirin: Secondary | ICD-10-CM

## 2013-03-01 DIAGNOSIS — I498 Other specified cardiac arrhythmias: Secondary | ICD-10-CM | POA: Diagnosis not present

## 2013-03-01 DIAGNOSIS — Z79899 Other long term (current) drug therapy: Secondary | ICD-10-CM

## 2013-03-01 DIAGNOSIS — I959 Hypotension, unspecified: Secondary | ICD-10-CM | POA: Diagnosis not present

## 2013-03-01 DIAGNOSIS — E785 Hyperlipidemia, unspecified: Secondary | ICD-10-CM | POA: Diagnosis present

## 2013-03-01 DIAGNOSIS — R0600 Dyspnea, unspecified: Secondary | ICD-10-CM

## 2013-03-01 DIAGNOSIS — I252 Old myocardial infarction: Secondary | ICD-10-CM

## 2013-03-01 DIAGNOSIS — I2510 Atherosclerotic heart disease of native coronary artery without angina pectoris: Secondary | ICD-10-CM

## 2013-03-01 DIAGNOSIS — I10 Essential (primary) hypertension: Secondary | ICD-10-CM | POA: Diagnosis present

## 2013-03-01 HISTORY — DX: Essential (primary) hypertension: I10

## 2013-03-01 HISTORY — DX: Coronary angioplasty status: Z98.61

## 2013-03-01 HISTORY — DX: Presence of coronary angioplasty implant and graft: Z95.5

## 2013-03-01 HISTORY — PX: LEFT HEART CATHETERIZATION WITH CORONARY ANGIOGRAM: SHX5451

## 2013-03-01 HISTORY — DX: Atherosclerotic heart disease of native coronary artery without angina pectoris: I25.10

## 2013-03-01 HISTORY — DX: Old myocardial infarction: I25.2

## 2013-03-01 LAB — COMPREHENSIVE METABOLIC PANEL
ALT: 26 U/L (ref 0–53)
AST: 30 U/L (ref 0–37)
CO2: 26 mEq/L (ref 19–32)
Calcium: 9.6 mg/dL (ref 8.4–10.5)
Chloride: 100 mEq/L (ref 96–112)
Creatinine, Ser: 1.03 mg/dL (ref 0.50–1.35)
GFR calc Af Amer: 83 mL/min — ABNORMAL LOW (ref >90)
GFR calc non Af Amer: 72 mL/min — ABNORMAL LOW (ref >90)
Glucose, Bld: 195 mg/dL — ABNORMAL HIGH (ref 70–99)
Sodium: 138 mEq/L (ref 135–145)
Total Bilirubin: 0.5 mg/dL (ref 0.3–1.2)

## 2013-03-01 LAB — CBC
Hemoglobin: 14.4 g/dL (ref 13.0–17.0)
MCH: 31.9 pg (ref 26.0–34.0)
MCV: 90 fL (ref 78.0–100.0)
Platelets: 161 10*3/uL (ref 150–400)
RBC: 4.51 MIL/uL (ref 4.22–5.81)
WBC: 7 10*3/uL (ref 4.0–10.5)

## 2013-03-01 LAB — GLUCOSE, CAPILLARY: Glucose-Capillary: 204 mg/dL — ABNORMAL HIGH (ref 70–99)

## 2013-03-01 SURGERY — LEFT HEART CATHETERIZATION WITH CORONARY ANGIOGRAM
Anesthesia: LOCAL

## 2013-03-01 MED ORDER — INSULIN ASPART 100 UNIT/ML ~~LOC~~ SOLN
0.0000 [IU] | Freq: Three times a day (TID) | SUBCUTANEOUS | Status: DC
  Administered 2013-03-02 – 2013-03-04 (×3): 1 [IU] via SUBCUTANEOUS

## 2013-03-01 MED ORDER — ONDANSETRON HCL 4 MG/2ML IJ SOLN: 4.0000 mg | Freq: Four times a day (QID) | INTRAMUSCULAR | Status: DC | PRN

## 2013-03-01 MED ORDER — TICAGRELOR 90 MG PO TABS
90.0000 mg | ORAL_TABLET | Freq: Two times a day (BID) | ORAL | Status: DC
  Administered 2013-03-02 – 2013-03-04 (×5): 90 mg via ORAL
  Filled 2013-03-01 (×6): qty 1

## 2013-03-01 MED ORDER — SODIUM CHLORIDE 0.9 % IV SOLN: INTRAVENOUS | Status: DC

## 2013-03-01 MED ORDER — ASPIRIN 81 MG PO CHEW
81.0000 mg | CHEWABLE_TABLET | Freq: Every day | ORAL | Status: DC
  Administered 2013-03-02 – 2013-03-04 (×3): 81 mg via ORAL
  Filled 2013-03-01 (×3): qty 1

## 2013-03-01 MED ORDER — MIDAZOLAM HCL 2 MG/2ML IJ SOLN
INTRAMUSCULAR | Status: AC
  Filled 2013-03-01: qty 2

## 2013-03-01 MED ORDER — NITROGLYCERIN IN D5W 200-5 MCG/ML-% IV SOLN
INTRAVENOUS | Status: AC
  Filled 2013-03-01: qty 250

## 2013-03-01 MED ORDER — NITROGLYCERIN 0.2 MG/ML ON CALL CATH LAB
INTRAVENOUS | Status: AC
  Filled 2013-03-01: qty 1

## 2013-03-01 MED ORDER — INSULIN ASPART 100 UNIT/ML ~~LOC~~ SOLN
0.0000 [IU] | Freq: Every day | SUBCUTANEOUS | Status: DC
  Administered 2013-03-01: 2 [IU] via SUBCUTANEOUS

## 2013-03-01 MED ORDER — ASPIRIN EC 81 MG PO TBEC: 81.0000 mg | DELAYED_RELEASE_TABLET | Freq: Every day | ORAL | Status: DC

## 2013-03-01 MED ORDER — SODIUM CHLORIDE 0.9 % IV SOLN
0.2500 mg/kg/h | INTRAVENOUS | Status: AC
  Filled 2013-03-01: qty 250

## 2013-03-01 MED ORDER — LIDOCAINE HCL (PF) 1 % IJ SOLN
INTRAMUSCULAR | Status: AC
  Filled 2013-03-01: qty 30

## 2013-03-01 MED ORDER — PANTOPRAZOLE SODIUM 40 MG PO TBEC
40.0000 mg | DELAYED_RELEASE_TABLET | Freq: Every day | ORAL | Status: DC
  Administered 2013-03-02 – 2013-03-04 (×3): 40 mg via ORAL
  Filled 2013-03-01 (×3): qty 1

## 2013-03-01 MED ORDER — BIVALIRUDIN 250 MG IV SOLR
INTRAVENOUS | Status: AC
  Filled 2013-03-01: qty 250

## 2013-03-01 MED ORDER — ACETAMINOPHEN 325 MG PO TABS
650.0000 mg | ORAL_TABLET | ORAL | Status: DC | PRN
  Administered 2013-03-02: 650 mg via ORAL
  Filled 2013-03-01: qty 2

## 2013-03-01 MED ORDER — ACETAMINOPHEN 325 MG PO TABS: 650.0000 mg | ORAL_TABLET | ORAL | Status: DC | PRN

## 2013-03-01 MED ORDER — LISINOPRIL 2.5 MG PO TABS
2.5000 mg | ORAL_TABLET | Freq: Every day | ORAL | Status: DC
  Filled 2013-03-01: qty 1

## 2013-03-01 MED ORDER — ATORVASTATIN CALCIUM 80 MG PO TABS
80.0000 mg | ORAL_TABLET | Freq: Every day | ORAL | Status: DC
  Administered 2013-03-02 – 2013-03-03 (×2): 80 mg via ORAL
  Filled 2013-03-01 (×3): qty 1

## 2013-03-01 MED ORDER — HEPARIN (PORCINE) IN NACL 2-0.9 UNIT/ML-% IJ SOLN
INTRAMUSCULAR | Status: AC
  Filled 2013-03-01: qty 1000

## 2013-03-01 MED ORDER — NITROGLYCERIN IN D5W 200-5 MCG/ML-% IV SOLN
2.0000 ug/min | INTRAVENOUS | Status: DC
  Administered 2013-03-01: 25 ug/min via INTRAVENOUS

## 2013-03-01 MED ORDER — NITROGLYCERIN 0.4 MG SL SUBL: 0.4000 mg | SUBLINGUAL_TABLET | SUBLINGUAL | Status: DC | PRN

## 2013-03-01 MED ORDER — INSULIN ASPART 100 UNIT/ML ~~LOC~~ SOLN
3.0000 [IU] | Freq: Three times a day (TID) | SUBCUTANEOUS | Status: DC
  Administered 2013-03-02 – 2013-03-04 (×5): 3 [IU] via SUBCUTANEOUS

## 2013-03-01 MED ORDER — HEPARIN SODIUM (PORCINE) 5000 UNIT/ML IJ SOLN
INTRAMUSCULAR | Status: AC
  Filled 2013-03-01: qty 1

## 2013-03-01 MED ORDER — METOPROLOL TARTRATE 1 MG/ML IV SOLN
INTRAVENOUS | Status: AC
  Filled 2013-03-01: qty 5

## 2013-03-01 MED ORDER — TICAGRELOR 90 MG PO TABS
ORAL_TABLET | ORAL | Status: AC
  Filled 2013-03-01: qty 2

## 2013-03-01 MED ORDER — FENTANYL CITRATE 0.05 MG/ML IJ SOLN
INTRAMUSCULAR | Status: AC
  Filled 2013-03-01: qty 2

## 2013-03-01 MED ORDER — SODIUM CHLORIDE 0.9 % IV SOLN
INTRAVENOUS | Status: DC
  Administered 2013-03-01: 125 mL/h via INTRAVENOUS

## 2013-03-01 MED ORDER — HEPARIN SODIUM (PORCINE) 5000 UNIT/ML IJ SOLN
60.0000 [IU]/kg | INTRAMUSCULAR | Status: DC
  Administered 2013-03-01: 4000 [IU] via INTRAVENOUS

## 2013-03-01 NOTE — ED Notes (Addendum)
Per EMS: pt was eating dinner and he started having CP, pt states sitting still and pain started, pt states pain was in chest and radiated down left and right arm. Pt had 1 SL nitro, 325mg  of aspirin and 4mg  of morphine with EMS.

## 2013-03-01 NOTE — H&P (Signed)
Cardiology History and Physical  Kaleen Mask, MD  History of Present Illness (and review of medical records): Jose Duarte is a 72 y.o. male who presents for evaluation of chest pain.  Patient reported doing a lot of yard work today.  He was eating dinner and developed acute onset of chest pain.  Pain radiated across chest and to arms bilaterally.  Patient was home with his wife and they called EMS.  EMS initial tracing was concerning for ST elevation on ekg.  STEMI was called.  He was having 8/10 pain.  He two ASA at home. Also received NTG, Morphine, and Heparin bolus.  Previous diagnostic testing for coronary artery disease includes: cardiac catheterization and echocardiogram. Previous history of cardiac disease includes Angina. Coronary artery disease risk factors include: advanced age (older than 71 for men, 74 for women), diabetes mellitus, dyslipidemia, hypertension and male gender. Patient denies history of CHF, coronary angioplasty, coronary artery stent, previous M.I. and valvular disease.  Review of Systems Pertinent items are noted in HPI. Further review of systems was otherwise negative other than stated in HPI.  Patient Active Problem List   Diagnosis Date Noted  . Left inguinal hernia 08/07/2012  . Ventral hernia 07/11/2012  . Abdominal pain    Past Medical History  Diagnosis Date  . GERD (gastroesophageal reflux disease)   . Hyperlipidemia   . Stroke     TIA  . Hypertension   . Abdominal pain   . Diabetes mellitus without complication   . PONV (postoperative nausea and vomiting)   . Sleep apnea     sleep study- 20years ago - does not wear CPAP    Past Surgical History  Procedure Laterality Date  . Neck surgery  1960's or 70's  . Cholecystectomy  2007  . Colon surgery  2011  . Inguinal hernia repair Left 09/18/2012    Procedure: LAPAROSCOPIC INGUINAL HERNIA;  Surgeon: Axel Filler, MD;  Location: [facility];  Service: General;  Laterality: Left;  Marland Kitchen  Ventral hernia repair N/A 09/18/2012    Procedure: LAPAROSCOPIC VENTRAL HERNIA;  Surgeon: Axel Filler, MD;  Location: [facility];  Service: General;  Laterality: N/A;  . Insertion of mesh Left 09/18/2012    Procedure: INSERTION OF MESH;  Surgeon: Axel Filler, MD;  Location: [facility];  Service: General;  Laterality: Left;  . Laparoscopic lysis of adhesions N/A 09/18/2012    Procedure: LAPAROSCOPIC LYSIS OF ADHESIONS;  Surgeon: Axel Filler, MD;  Location: [facility];  Service: General;  Laterality: N/A;    No prescriptions prior to admission   Allergies  Allergen Reactions  . Penicillins Anaphylaxis    History  Substance Use Topics  . Smoking status: Never Smoker   . Smokeless tobacco: Former Neurosurgeon    Types: Chew     Comment: quit 2003  . Alcohol Use: No    Family History  Problem Relation Age of Onset  . Cancer Sister     lung     Objective:  No data found.  General appearance: alert, cooperative, appears stated age and no distress Head: Normocephalic, without obvious abnormality, atraumatic Eyes: conjunctivae/corneas clear. PERRL, EOM's intact. Fundi benign. Neck: no carotid bruit, no JVD and supple, symmetrical, trachea midline Lungs: clear to auscultation bilaterally Chest wall: no tenderness Heart: regular rate and rhythm, S1, S2 normal, no murmur, click, rub or gallop Abdomen: soft, non-tender; bowel sounds normal; no masses,  no organomegaly Extremities: extremities normal, atraumatic, no cyanosis or edema Pulses: 2+ and symmetric Neurologic: Grossly  normal  No results found for this or any previous visit (from the past 48 hour(s)). No results found.  ECG:  ST elevation in inferior and lateral leads.  ST depression anteriorly. HR 54  Assessment: 16M with acute onset of chest pain with ST elevation on ekg concerning for ACS/STEMI-Inferiolateral  Plan:  1. Emergent cardiac catheterization by Dr. Tresa Endo. 2. Cardiology Admission to ICU post procedure 3. Continuous  monitoring on Telemetry. 4. Repeat ekg on admit, prn chest pain or arrythmia 5. Trend cardiac biomarkers, check lipids, hgba1c, tsh 6. Medical management to include DAPT BB, ACEi, Statin, NTG prn 7. Further management pending procedure results.

## 2013-03-01 NOTE — Progress Notes (Signed)
Chaplain responded to code. Family arrived a while after patient and chaplain escorted them to cath lab waiting area and, after visiting cath lab, gave them a brief update. Chaplain returned later to check on patient again and provide more information.

## 2013-03-01 NOTE — ED Provider Notes (Signed)
CSN: [id number]     Arrival date & time 03/01/13  2038 History   First MD Initiated Contact with Patient 03/01/13 2046     Chief Complaint  Patient presents with  . Code STEMI   (Consider location/radiation/quality/duration/timing/severity/associated sxs/prior Treatment) HPI Comments: Patient presents to ER by EMS. EMS had performed a 12-lead and have diagnosed the patient with inferior lateral ST elevation MI. Patient was met in the ER by cardiology as well as myself. Patient reports that he had been having pain for approximately an hour before calling EMS. He reports that the pain progressively worsened. It started after doing light yard work. He did not have shortness of breath but did have nausea and diaphoresis. Patient was given aspirin and nitroglycerin in route, pain is not resolved but is better than it was initially.  Patient reports that he had a catheterization years ago that was normal. He has not had any cardiac followup since.   Past Medical History  Diagnosis Date  . GERD (gastroesophageal reflux disease)   . Hyperlipidemia   . Stroke     TIA  . Hypertension   . Abdominal pain   . Diabetes mellitus without complication   . PONV (postoperative nausea and vomiting)   . Sleep apnea     sleep study- 20years ago - does not wear CPAP   Past Surgical History  Procedure Laterality Date  . Neck surgery  1960's or 70's  . Cholecystectomy  2007  . Colon surgery  2011  . Inguinal hernia repair Left 09/18/2012    Procedure: LAPAROSCOPIC INGUINAL HERNIA;  Surgeon: Axel Filler, MD;  Location: [facility];  Service: General;  Laterality: Left;  Marland Kitchen Ventral hernia repair N/A 09/18/2012    Procedure: LAPAROSCOPIC VENTRAL HERNIA;  Surgeon: Axel Filler, MD;  Location: [facility];  Service: General;  Laterality: N/A;  . Insertion of mesh Left 09/18/2012    Procedure: INSERTION OF MESH;  Surgeon: Axel Filler, MD;  Location: [facility];  Service: General;  Laterality: Left;  . Laparoscopic  lysis of adhesions N/A 09/18/2012    Procedure: LAPAROSCOPIC LYSIS OF ADHESIONS;  Surgeon: Axel Filler, MD;  Location: [facility];  Service: General;  Laterality: N/A;   Family History  Problem Relation Age of Onset  . Cancer Sister     lung   History  Substance Use Topics  . Smoking status: Never Smoker   . Smokeless tobacco: Former Neurosurgeon    Types: Chew     Comment: quit 2003  . Alcohol Use: No    Review of Systems  Constitutional: Positive for diaphoresis.  Respiratory: Negative for shortness of breath.   Cardiovascular: Positive for chest pain.  Gastrointestinal: Positive for nausea. Negative for vomiting.  All other systems reviewed and are negative.    Allergies  Penicillins  Home Medications   Current Outpatient Rx  Name  Route  Sig  Dispense  Refill  . aspirin 81 MG tablet   Oral   Take 81 mg by mouth daily.         . CRESTOR 10 MG tablet   Oral   Take 10 mg by mouth every morning.          . enalapril-hydrochlorothiazide (VASERETIC) 10-25 MG per tablet   Oral   Take 1 tablet by mouth daily. Take 1/2 pill daily         . metFORMIN (GLUCOPHAGE) 1000 MG tablet   Oral   Take 500 mg by mouth 2 (two) times daily with  a meal.         . pantoprazole (PROTONIX) 40 MG tablet   Oral   Take 40 mg by mouth daily.           There were no vitals taken for this visit. Physical Exam  Constitutional: He is oriented to person, place, and time. He appears well-developed and well-nourished. No distress.  HENT:  Head: Normocephalic and atraumatic.  Right Ear: Hearing normal.  Left Ear: Hearing normal.  Nose: Nose normal.  Mouth/Throat: Oropharynx is clear and moist and mucous membranes are normal.  Eyes: Conjunctivae and EOM are normal. Pupils are equal, round, and reactive to light.  Neck: Normal range of motion. Neck supple.  Cardiovascular: Regular rhythm, S1 normal and S2 normal.  Exam reveals no gallop and no friction rub.   No murmur  heard. Pulmonary/Chest: Effort normal and breath sounds normal. No respiratory distress. He exhibits no tenderness.  Abdominal: Soft. Normal appearance and bowel sounds are normal. There is no hepatosplenomegaly. There is no tenderness. There is no rebound, no guarding, no tenderness at McBurney's point and negative Murphy's sign. No hernia.  Musculoskeletal: Normal range of motion.  Neurological: He is alert and oriented to person, place, and time. He has normal strength. No cranial nerve deficit or sensory deficit. Coordination normal. GCS eye subscore is 4. GCS verbal subscore is 5. GCS motor subscore is 6.  Skin: Skin is warm, dry and intact. No rash noted. No cyanosis.  Psychiatric: He has a normal mood and affect. His speech is normal and behavior is normal. Thought content normal.    ED Course  Procedures (including critical care time)  EKG:  Date: 03/01/2013  Rate: 58   Rhythm: normal sinus rhythm  QRS Axis: normal  Intervals: normal  ST/T Wave abnormalities: ST elevations inferiorly, ST elevations laterally and ST depressions anteriorly  Conduction Disutrbances:none  Narrative Interpretation: Acute inerior-lateral ST elevation MI (possible posterior involvement)  Old EKG Reviewed: changes noted    Labs Review Labs Reviewed  COMPREHENSIVE METABOLIC PANEL - Abnormal; Notable for the following:    Glucose, Bld 195 (*)    GFR calc non Af Amer 72 (*)    GFR calc Af Amer 83 (*)    All other components within normal limits  TROPONIN I - Abnormal; Notable for the following:    Troponin I >20.00 (*)    All other components within normal limits  TROPONIN I - Abnormal; Notable for the following:    Troponin I >20.00 (*)    All other components within normal limits  HEMOGLOBIN A1C - Abnormal; Notable for the following:    Hemoglobin A1C 7.2 (*)    Mean Plasma Glucose 160 (*)    All other components within normal limits  CBC - Abnormal; Notable for the following:    RBC 4.03  (*)    Hemoglobin 12.4 (*)    HCT 36.1 (*)    All other components within normal limits  BASIC METABOLIC PANEL - Abnormal; Notable for the following:    Glucose, Bld 136 (*)    GFR calc non Af Amer 87 (*)    All other components within normal limits  GLUCOSE, CAPILLARY - Abnormal; Notable for the following:    Glucose-Capillary 204 (*)    All other components within normal limits  LIPID PANEL - Abnormal; Notable for the following:    HDL 34 (*)    All other components within normal limits  GLUCOSE, CAPILLARY - Abnormal; Notable for  the following:    Glucose-Capillary 124 (*)    All other components within normal limits  GLUCOSE, CAPILLARY - Abnormal; Notable for the following:    Glucose-Capillary 128 (*)    All other components within normal limits  GLUCOSE, CAPILLARY - Abnormal; Notable for the following:    Glucose-Capillary 102 (*)    All other components within normal limits  GLUCOSE, CAPILLARY - Abnormal; Notable for the following:    Glucose-Capillary 124 (*)    All other components within normal limits  GLUCOSE, CAPILLARY - Abnormal; Notable for the following:    Glucose-Capillary 126 (*)    All other components within normal limits  GLUCOSE, CAPILLARY - Abnormal; Notable for the following:    Glucose-Capillary 100 (*)    All other components within normal limits  GLUCOSE, CAPILLARY - Abnormal; Notable for the following:    Glucose-Capillary 163 (*)    All other components within normal limits  GLUCOSE, CAPILLARY - Abnormal; Notable for the following:    Glucose-Capillary 123 (*)    All other components within normal limits  GLUCOSE, CAPILLARY - Abnormal; Notable for the following:    Glucose-Capillary 134 (*)    All other components within normal limits  MRSA PCR SCREENING  APTT  CBC  PROTIME-INR  TSH  PRO B NATRIURETIC PEPTIDE  PROTIME-INR  MAGNESIUM  GLUCOSE, CAPILLARY  POCT ACTIVATED CLOTTING TIME   Imaging Review No results found.  MDM   1. ST  elevation myocardial infarction (STEMI) of inferior wall, initial episode of care   2. CAD S/P percutaneous coronary angioplasty   3. Diabetes mellitus type 2 with complications   4. HTN (hypertension), benign   5. Hyperlipidemia LDL goal <70   6. Dyspnea   7. CAD S/P percutaneous coronary angioplasty - PCI Ramus    Patient presented as a code STEMI. Acute ST elevation MI was confirmed by EKG. Patient evaluated in conjunction with cardiology. Patient taken to the cath lab for emergent cardiac catheterization.    Gilda Crease, MD 03/09/13 [phone]

## 2013-03-01 NOTE — CV Procedure (Signed)
Jose Duarte is a 72 y.o. male    [id number]  [id number] LOCATION:  FACILITY: [facility]  PHYSICIAN: Lennette Bihari, MD, [facility] [birth year]   DATE OF PROCEDURE:  03/01/2013    [facility]  EMERGENT CARDIAC CATHETERIZATION/PERCUTANEOUS CORONARY INTERVENTION    HISTORY: Jose Duarte is a 72 year old patient of Dr. Windle Guard. He was transported this evening by EMS to: Hospital and was encoded as inferolateral ST segment elevation myocardial infarction. He developed chest pain this evening after eating dinner. He had taken aspirin at home and upon arrival to ER received nitroglycerin, 1 mg morphine, as well as a heparin 4000 unit bolus. He was immediately transported up to the catheterization laboratory for acute cardiac catheterization.   PROCEDURE:  Upon arrival to the catheterization laboratory, the patient was still experiencing 8/10 chest pain. His right femoral artery was punctured anteriorly and a  6 French sheath was inserted without difficulty. A 5 French diagnostic left catheter was inserted and selective angiography into the left coronary system was performed. This demonstrated total occlusion of the proximal ramus intermediate vessel. A 5 [place] JR4  catheter was used for selective angiography into the right coronary artery. The patient received bivalirudin bolus plus infusion. He also was given 180 mg of oral Brilinta. A 6 French XB 3.5 guide was then inserted. ACT was documented to be therapeutic. An Asahi medium wire was advanced into the proximal ramus was not able across the occlusion. A 2.0x12 mm Emerge balloon was then inserted to provide support to the wire which ultimately allowed the wire to cross the total occlusion. Once the lesion was visible, there was significant thrombus in the setting of an ulcerated plaque. With reperfusion the patient did develop idioventricular rhythm. He received 2 doses of Lopressor 2.5 mg.  Two inflations were  made with the 2.0x12 mm Emerge balloon. Because of significant thrombus burden a Priority One thrombectomy catheter was then inserted and 2 runs were made with thrombus removal. A 2.5x15 mm Emerge Monorail balloon was then inserted 2 inflations at 6 and 8 atmospheres were performed. A 3.0x16 mm promiscuity or DES stent was then inserted with the proximal portion of the stent abutting to the trifurcation of the LAD circumflex and intermediate vessel. This was dilated x2 at 11 and 12 atmospheres and angiography confirmed excellent placement. 3.25x12 mm [place] Euphora balloon was then inserted for post stent dilatation x3 at 13 atmospheres. Scout angiography confirmed an excellent angiographic result. The Ramus  intermediate vessel was a very large bifurcating vessel which supplied a large portion of the apical segment. The wire,balloon, and catheter were then removed. A 5 French pigtail catheter was used for RAO ventriculography. During the procedure the patient also received fentanyl 50 mcg and 2 mg of Versed. He was started on IV  nitroglycerin and also received several doses of intracoronary nitroglycerin. At the completion of the procedure, he was pain-free with brisk TIMI-3 flow with stable hemodynamics. The arterial sheath was sutured in place with plans to continue Angiomax at reduced dose for 4 hours. He was transferred to the coronary care unit in stable condition.    HEMODYNAMICS:   Central Aorta: 136/77   Left Ventricle: 136/18/29  ANGIOGRAPHY:  Left main coronary artery was a large vessel that trifurcated into moderate size LAD, a nubbin of a Ramus immediate vessel, and a small circumflex vessel.  Left anterior descending artery gave rise to a proximal diagonal vessel which had narrowing in its midsegment of 40%.  The LAD between the first and second diagonal had diffuse 50% narrowing in the region of the septal perforating artery takeoff. The mid distal LAD was free of significant disease in  barely reach the apex.  The ramus immediate vessel was totally occluded just after its takeoff from the left main.  The circumflex artery was small caliber vessel free of significant disease  Right carotid artery was moderate size vessel that had diffuse luminal irregularities and narrowings of 30-50% in its midsegment. The vessel supplied the PDA posterior lateral vessel.  Following percutaneous coronary intervention of the ramus intermediate vessel with PTCA, thrombectomy, stenting with a 3.0x16 mm Promus Premier DES stent, postdilated to 3.2 mm the 100% occlusion is reduced to 0%. The portion of the stent extended to the ostium of the ramus vessel arising from the left main. There was brisk TIMI-3 flow. The ramus vessel is a large vessel which gave rise to 2 large branches which supplied the inferolateral and apical wall.  Left ventriculography revealed preserved global contractility with an ejection fraction of approximately 45%. There was noted anterolateral hypocontractility noted on the RAO projection. An LAO projection was not performed   IMPRESSION:  Acute inferolateral ST segment elevation myocardial infarction secondary to total occlusion of a large dominant right proximal ramus intermediate vessel secondary to ulcerated plaque rupture with resultant thrombus requiring PTCA, thrombectomy, and stenting with a 3.0x16 mm Promus Premier DES stent postdilated 3.2 mm with 100% occlusion reduced to 0% and resumption of TIMI 3 flow.  Left anterior descending artery with diffuse 50% stenosis in its mid segment between the first and second diagonal with 40% narrowing in the first diagonal vessel.   Normal small left circumflex vessel  Large right coronary artery with diffuse 30-50% luminal irregularities of the mid segment.  Preserved LV function with moderate anterolateral hypocontractility  Angiomax, 180 mg  brilinta, IC and IV nitroglycerin  Time from Cath Lab arrival to first balloon  inflation 23 minutes.    Lennette Bihari, MD, [facility] 03/01/2013 10:40 PM

## 2013-03-02 ENCOUNTER — Encounter (HOSPITAL_COMMUNITY): Payer: Self-pay | Admitting: Cardiology

## 2013-03-02 DIAGNOSIS — Z9861 Coronary angioplasty status: Secondary | ICD-10-CM

## 2013-03-02 DIAGNOSIS — E785 Hyperlipidemia, unspecified: Secondary | ICD-10-CM

## 2013-03-02 DIAGNOSIS — I10 Essential (primary) hypertension: Secondary | ICD-10-CM | POA: Diagnosis present

## 2013-03-02 DIAGNOSIS — I2119 ST elevation (STEMI) myocardial infarction involving other coronary artery of inferior wall: Secondary | ICD-10-CM

## 2013-03-02 DIAGNOSIS — E118 Type 2 diabetes mellitus with unspecified complications: Secondary | ICD-10-CM

## 2013-03-02 HISTORY — DX: Essential (primary) hypertension: I10

## 2013-03-02 LAB — BASIC METABOLIC PANEL
BUN: 18 mg/dL (ref 6–23)
Calcium: 8.8 mg/dL (ref 8.4–10.5)
GFR calc non Af Amer: 87 mL/min — ABNORMAL LOW (ref >90)
Glucose, Bld: 136 mg/dL — ABNORMAL HIGH (ref 70–99)
Sodium: 138 mEq/L (ref 135–145)

## 2013-03-02 LAB — GLUCOSE, CAPILLARY
Glucose-Capillary: 128 mg/dL — ABNORMAL HIGH (ref 70–99)
Glucose-Capillary: 102 mg/dL — ABNORMAL HIGH (ref 70–99)

## 2013-03-02 LAB — TROPONIN I
Troponin I: >20 ng/mL (ref <0.30)
Troponin I: >20 ng/mL (ref <0.30)

## 2013-03-02 LAB — CBC
HCT: 36.1 % — ABNORMAL LOW (ref 39.0–52.0)
Hemoglobin: 12.4 g/dL — ABNORMAL LOW (ref 13.0–17.0)
MCH: 30.8 pg (ref 26.0–34.0)
MCHC: 34.3 g/dL (ref 30.0–36.0)

## 2013-03-02 LAB — LIPID PANEL
Cholesterol: 97 mg/dL (ref 0–200)
Triglycerides: 123 mg/dL (ref <150)
VLDL: 25 mg/dL (ref 0–40)

## 2013-03-02 LAB — TSH: TSH: 0.711 u[IU]/mL (ref 0.350–4.500)

## 2013-03-02 LAB — HEMOGLOBIN A1C: Mean Plasma Glucose: 160 mg/dL — ABNORMAL HIGH (ref <117)

## 2013-03-02 LAB — POCT ACTIVATED CLOTTING TIME: Activated Clotting Time: 559 seconds

## 2013-03-02 MED ORDER — ATROPINE SULFATE 0.1 MG/ML IJ SOLN
INTRAMUSCULAR | Status: AC
  Administered 2013-03-02: 1 mg
  Filled 2013-03-02: qty 10

## 2013-03-02 MED ORDER — METOPROLOL TARTRATE 12.5 MG HALF TABLET
12.5000 mg | ORAL_TABLET | Freq: Two times a day (BID) | ORAL | Status: DC
  Administered 2013-03-02 – 2013-03-04 (×5): 12.5 mg via ORAL
  Filled 2013-03-02 (×6): qty 1

## 2013-03-02 MED ORDER — LISINOPRIL 5 MG PO TABS
5.0000 mg | ORAL_TABLET | Freq: Every day | ORAL | Status: DC
  Filled 2013-03-02: qty 1

## 2013-03-02 MED ORDER — LISINOPRIL 10 MG PO TABS
10.0000 mg | ORAL_TABLET | Freq: Every day | ORAL | Status: DC
  Administered 2013-03-02 – 2013-03-03 (×2): 10 mg via ORAL
  Filled 2013-03-02 (×3): qty 1

## 2013-03-02 MED FILL — Sodium Chloride IV Soln 0.9%: INTRAVENOUS | Qty: 1000 | Status: AC

## 2013-03-02 NOTE — Progress Notes (Signed)
Nutrition Brief Note  Malnutrition Screening Tool result is inaccurate.  Please consult if nutrition needs are identified.  Matelyn Antonelli, MS RD LDN Clinical Inpatient Dietitian Pager: [phone] Weekend/After hours pager: [phone]   

## 2013-03-02 NOTE — Progress Notes (Signed)
CRITICAL VALUE ALERT  Critical value received: Troponin > 20 Date of notification: 03/02/13 Time of notification:  0157 Critical value read back:Yes  Nurse who received alert: Melina Modena  MD notified (1st page):  On call MD Time of first page:0200  MD notified (2nd page):  Time of second page:  Responding MD:  Dr. Terressa Koyanagi Time MD responded: [phone]

## 2013-03-02 NOTE — Progress Notes (Signed)
Chaplain was provided with a follow-up to meet with patient from previous on-call chaplain.  Chaplain shared a visit with both patient and the patient's granddaughter.  Chaplain provided ministry of presence, pastoral care, and shared in prayer with the family.  Chaplain services will be available if needed/requested in the future.    03/02/13 1105  Clinical Encounter Type  Visited With Patient and family together  Visit Type Follow-up;Spiritual support  Referral From Chaplain  Consult/Referral To Chaplain  Spiritual Encounters  Spiritual Needs Prayer;Emotional  Stress Factors  Patient Stress Factors None identified  Family Stress Factors None identified  [facility] Sheral Apley [phone]

## 2013-03-02 NOTE — Progress Notes (Addendum)
Subjective: No further CP.  He got real dizzy after sheath pull.  Objective: Vital signs in last 24 hours: Temp:  [97.4 F (36.3 C)-99 F (37.2 C)] 99 F (37.2 C) (09/12 0400) Pulse Rate:  [48-66] 66 (09/12 0700) Resp:  [13-23] 22 (09/12 0700) BP: (97-160)/(56-86) 160/86 mmHg (09/12 0700) SpO2:  [95 %-99 %] 99 % (09/12 0700) Weight:  [199 lb 8.3 oz (90.5 kg)] 199 lb 8.3 oz (90.5 kg) (09/11 2240) Last BM Date: 03/01/13  Intake/Output from previous day: 09/11 0701 - 09/12 0700 In: 1190.7 [P.O.:120; I.V.:1070.7] Out: 500 [Urine:500] Intake/Output this shift:    Medications Current Facility-Administered Medications  Medication Dose Route Frequency Provider Last Rate Last Dose  . 0.9 %  sodium chloride infusion   Intravenous Continuous Gilda Crease, MD      . 0.9 %  sodium chloride infusion   Intravenous Continuous Lennette Bihari, MD 125 mL/hr at 03/01/13 2254 125 mL/hr at 03/01/13 2254  . acetaminophen (TYLENOL) tablet 650 mg  650 mg Oral Q4H PRN Lennette Bihari, MD   650 mg at 03/02/13 0003  . aspirin chewable tablet 81 mg  81 mg Oral Daily Lennette Bihari, MD      . atorvastatin (LIPITOR) tablet 80 mg  80 mg Oral q1800 Lennette Bihari, MD      . heparin 5000 UNIT/ML injection           . insulin aspart (novoLOG) injection 0-5 Units  0-5 Units Subcutaneous QHS [place] C. Terressa Koyanagi, MD   2 Units at 03/01/13 2317  . insulin aspart (novoLOG) injection 0-9 Units  0-9 Units Subcutaneous TID WC [place] C. Balfour, MD      . insulin aspart (novoLOG) injection 3 Units  3 Units Subcutaneous TID WC [place] C. Terressa Koyanagi, MD      . lisinopril (PRINIVIL,ZESTRIL) tablet 5 mg  5 mg Oral Daily Wilburt Finlay, PA-C      . nitroGLYCERIN (NITROSTAT) SL tablet 0.4 mg  0.4 mg Sublingual Q5 Min x 3 PRN [place] C. Balfour, MD      . nitroGLYCERIN 0.2 mg/mL in dextrose 5 % infusion  2-200 mcg/min Intravenous Continuous Lennette Bihari, MD   30 mcg/min at 03/01/13 2358  . ondansetron (ZOFRAN)  injection 4 mg  4 mg Intravenous Q6H PRN Lennette Bihari, MD      . pantoprazole (PROTONIX) EC tablet 40 mg  40 mg Oral Q0600 Lennette Bihari, MD      . Ticagrelor [facility]) tablet 90 mg  90 mg Oral BID Lennette Bihari, MD       PE: General appearance: alert, cooperative and no distress Lungs: clear to auscultation bilaterally Heart: regular rate and rhythm, S1, S2 normal, no murmur, click, rub or gallop Extremities: No LEE Pulses: 2+ and symmetric Skin: Right groin:  No hematoma.  nontender. Neurologic: Grossly normal  Lab Results:   Recent Labs  03/01/13 2045 03/02/13 0445  WBC 7.0 6.6  HGB 14.4 12.4*  HCT 40.6 36.1*  PLT 161 161   BMET  Recent Labs  03/01/13 2045 03/02/13 0445  NA 138 138  K 3.8 3.9  CL 100 105  CO2 26 22  GLUCOSE 195* 136*  BUN 18 18  CREATININE 1.03 0.83  CALCIUM 9.6 8.8   PT/INR  Recent Labs  03/01/13 2045 03/02/13 0445  LABPROT 13.4 15.1  INR 1.04 1.22   Cholesterol  Recent Labs  03/02/13 0445  CHOL 97   Lipid  Panel     Component Value Date/Time   CHOL 97 03/02/2013 0445   TRIG 123 03/02/2013 0445   HDL 34* 03/02/2013 0445   CHOLHDL 2.9 03/02/2013 0445   VLDL 25 03/02/2013 0445   LDLCALC 38 03/02/2013 0445   Cardiac Panel (last 3 results)  Recent Labs  03/01/13 0032 03/02/13 0440  TROPONINI >20.00* >20.00*   ECG: Resolution of Inferolateral ST Elevations.  Assessment/Plan   Principal Problem:   ST elevation myocardial infarction (STEMI) of inferior wall, initial episode of care - Ramus Intermedius Occlusion, PCI with DES Promus Premier 3.0 mm x 16 mm Active Problems:   CAD S/P percutaneous coronary angioplasty - PCI Ramus   HTN (hypertension), benign   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  ASA, brilinta.  BP high.  Will increase lisinopril.  HR apparently dropped into the 30's.  Likely vagal response after sheath pull.  Will  not add a beta blocker at this time.     LOS: 1 day   HAGER, BRYAN 03/02/2013 7:41 AM  I have seen and evaluated the patient this AM along with Wilburt Finlay, PA. I agree with his findings, examination as well as impression recommendations.  72 y/o M s/p Inf-Lat STEMI - with Ramus Intermedius Occlusion -- PCI with DES. Troponin as expected > 20K Vagal response to sheath pull led to bradycardia & hypotension.   BP has notably improved since sheath removal -- will increase ACE-I dose to 10 mg; agree with holding off on BB until HR proves to be consistently > 60 bpm  Currently covering DM with SSI - CBG high last PM; holding Metformin x 48 hr post cath.  High dose statin.  DAPT ASA + Brilinta; PPI for GI prophyylaxis  Normal progression today -- ambulate with CRH c/s --> may likely be able to move to Telemetry in AM.   CM c/s to assist with Brilinta.  MD Time with pt: 10 min  Christalyn Goertz W, M.D., M.S. [facility] [address] [place], [place]  [address]  [phone] Pager # [phone] 03/02/2013 8:02 AM  ADDENDUM: Shortly after seeing the pt, he had a brief run of NSVT - asymptomatic.   He is within 24 hr of STEMI -- will initiate low dose BB with hold parameters. Only if episodes are prolonged will we use Amiodarone.  Marykay Lex, MD

## 2013-03-02 NOTE — Progress Notes (Signed)
CARDIAC REHAB PHASE I   PRE:  Rate/Rhythm: 54 SB  BP:  Supine:   Sitting: 148/77  Standing:    SaO2: 96% RA  MODE:  Ambulation: 160 ft   POST:  Rate/Rhythm: 68 SR  BP:  Supine:   Sitting: 155/67  Standing:    SaO2: 94% RA  1330-1403- Patient ambulated 160 ft with assist x1 and pushing a rolling walker. Patient tolerated ambulation fair, c/o SOB during walker and said he felt a little SOB prior to walk. SaO2 was 94% on RA after walk. Initiated MI/PCI education with patient and pt's  family including restrictions, risk factor modification, anticoagulant use, CP, NTG use and calling 911. MI book given. Pt was put on O2 by his RN after walk and assisted to bed.    Cristy Hilts, MS, ACSM CES

## 2013-03-02 NOTE — Progress Notes (Signed)
Utilization review completed. Bently Wyss, RN, BSN. 

## 2013-03-02 NOTE — Progress Notes (Signed)
Rt femoral groin sheath discontinued and manual pressure held for 20 min.  5 min onto hold, pt's HR dropped into 30's and pt c/o feeing sick to stomach, pt was pale and diaphoretic. 0.4mg  Atropine given and HR remained in late 30's, remaining 0.6mg  given for a full amp. Pt fanned and cool compress to forehead. Within minutes, pt stated he was starting to feel better. Right groin site soft with no hematoma. Pressure dressing applied to site. Post cath instructions given to pt, pt vu. Pt on bedrest and aware to keep right leg straight. HR back in 50's. Will cont to monitor closely.

## 2013-03-03 DIAGNOSIS — R0609 Other forms of dyspnea: Secondary | ICD-10-CM

## 2013-03-03 DIAGNOSIS — I517 Cardiomegaly: Secondary | ICD-10-CM

## 2013-03-03 LAB — GLUCOSE, CAPILLARY
Glucose-Capillary: 124 mg/dL — ABNORMAL HIGH (ref 70–99)
Glucose-Capillary: 126 mg/dL — ABNORMAL HIGH (ref 70–99)
Glucose-Capillary: 100 mg/dL — ABNORMAL HIGH (ref 70–99)

## 2013-03-03 NOTE — Progress Notes (Addendum)
Pt. Seen and examined. Agree with the NP/PA-C note as written. He denies any chest pain. He reports being dyspneic, maybe even more than he came in and tired. This may be due to the addition of b-blocker and/or brillinta. I asked him to track his symptoms today, which he thinks are getting better. Will not make changes at this time. Watch for NSVT on telemetry - ok to transfer to tele bed. Will review echo today. Anticipate d/c home tomorrow.   Chrystie Nose, MD, [facility] Attending Cardiologist [facility]

## 2013-03-03 NOTE — Progress Notes (Signed)
CARDIAC REHAB PHASE I   PRE:  Rate/Rhythm: 64 SR  BP:  Supine: 139/85 Sitting:   Standing:    SaO2: 97% RA  MODE:  Ambulation: 350 ft   POST:  Rate/Rhythm: 74 SR  BP:  Supine:   Sitting: 145/63  Standing:    SaO2: 97% RA   4540-9811 Patient tolerated ambulation well with assist x1 and pushing a rolling walker, c/o mild SOB after walk, SaO2 97% RA. Continued MI/PCI education from yesterday including activity progression and risk factor modification, heart healthy diet and diabetes handout given. Stent card reviewed and given. Pt voices understanding of instructions given. Discussed Phase 2 Cardiac Rehab, and patient is interested. Permission given to send contact info to Cardiac Rehab program in [place].  Cristy Hilts, MS, ACSM CES

## 2013-03-03 NOTE — Progress Notes (Signed)
  Echocardiogram 2D Echocardiogram has been performed.  Jorje Guild 03/03/2013, 12:09 PM

## 2013-03-03 NOTE — Progress Notes (Signed)
Subjective: He notes some SOB which is worse than prior to MI.  Objective: Vital signs in last 24 hours: Temp:  [97.8 F (36.6 C)-98.6 F (37 C)] 98.2 F (36.8 C) (09/13 0400) Pulse Rate:  [55-64] 57 (09/13 0400) Resp:  [17-25] 24 (09/12 1400) BP: (118-183)/(46-109) 123/87 mmHg (09/13 0600) SpO2:  [94 %-100 %] 96 % (09/13 0400) Last BM Date: 03/02/13  Intake/Output from previous day: 09/12 0701 - 09/13 0700 In: 125 [I.V.:125] Out: 1477 [Urine:1476; Stool:1] Intake/Output this shift:    Medications Current Facility-Administered Medications  Medication Dose Route Frequency Provider Last Rate Last Dose  . 0.9 %  sodium chloride infusion   Intravenous Continuous Gilda Crease, MD      . acetaminophen (TYLENOL) tablet 650 mg  650 mg Oral Q4H PRN Lennette Bihari, MD   650 mg at 03/02/13 0003  . aspirin chewable tablet 81 mg  81 mg Oral Daily Lennette Bihari, MD   81 mg at 03/02/13 1021  . atorvastatin (LIPITOR) tablet 80 mg  80 mg Oral q1800 Lennette Bihari, MD   80 mg at 03/02/13 1726  . insulin aspart (novoLOG) injection 0-5 Units  0-5 Units Subcutaneous QHS Pel[place]ret[place] C. Terressa Koyanagi, MD   2 Units at 03/01/13 2317  . insulin aspart (novoLOG) injection 0-9 Units  0-9 Units Subcutaneous TID WC [place]on C. Terressa Koyanagi, MD   1 Units at 03/02/13 1232  . insulin aspart (novoLOG) injection 3 Units  3 Units Subcutaneous TID WC Pelb[place]on C. Terressa Koyanagi, MD   3 Units at 03/02/13 1824  . lisinopril (PRINIVIL,ZESTRIL) tablet 10 mg  10 mg Oral Daily Marykay Lex, MD   10 mg at 03/02/13 1021  . metoprolol tartrate (LOPRESSOR) tablet 12.5 mg  12.5 mg Oral BID Marykay Lex, MD   12.5 mg at 03/02/13 2009  . nitroGLYCERIN (NITROSTAT) SL tablet 0.4 mg  0.4 mg Sublingual Q5 Min x 3 PRN [place] C. Balfour, MD      . nitroGLYCERIN 0.2 mg/mL in dextrose 5 % infusion  2-200 mcg/min Intravenous Continuous Lennette Bihari, MD   30 mcg/min at 03/01/13 2358  . ondansetron (ZOFRAN) injection 4 mg  4 mg  Intravenous Q6H PRN Lennette Bihari, MD      . pantoprazole (PROTONIX) EC tablet 40 mg  40 mg Oral Q0600 Lennette Bihari, MD   40 mg at 03/02/13 1021  . Ticagrelor (BRILINTA) tablet 90 mg  90 mg Oral BID Lennette Bihari, MD   90 mg at 03/02/13 2009    PE: General appearance: alert, cooperative and no distress  Lungs: clear to auscultation bilaterally  Heart: regular rate and rhythm, S1, S2 normal, no murmur, click, rub or gallop  Extremities: No LEE  Pulses: 2+ and symmetric  Skin: Right groin: No hematoma. nontender.  Neurologic: Grossly normal    Lab Results:   Recent Labs  03/01/13 2045 03/02/13 0445  WBC 7.0 6.6  HGB 14.4 12.4*  HCT 40.6 36.1*  PLT 161 161   BMET  Recent Labs  03/01/13 2045 03/02/13 0445  NA 138 138  K 3.8 3.9  CL 100 105  CO2 26 22  GLUCOSE 195* 136*  BUN 18 18  CREATININE 1.03 0.83  CALCIUM 9.6 8.8   PT/INR  Recent Labs  03/01/13 2045 03/02/13 0445  LABPROT 13.4 15.1  INR 1.04 1.22   Cholesterol  Recent Labs  03/02/13 0445  CHOL 97   Cardiac Enzymes No components found  with this basename: TROPONIN,  CKMB,   Studies/Results: @RISRSLT2 @   Assessment/Plan   Principal Problem:   ST elevation myocardial infarction (STEMI) of inferior wall, initial episode of care - Ramus Intermedius Occlusion, PCI with DES Promus Premier 3.0 mm x 16 mm Active Problems:   CAD S/P percutaneous coronary angioplasty - PCI Ramus   HTN (hypertension), benign   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 ASA, brilinta. BP high. Will increase lisinopril. HR apparently dropped into the 30's. Likely vagal response after sheath pull.   He is having some short ~6 beat runs of NSVT.  Low dose lopressor was added yesterday.    Echo ordered.  Transfer to telemetry today.    LOS: 2 days    Donal Lynam 03/03/2013 7:42 AM

## 2013-03-04 MED ORDER — TICAGRELOR 90 MG PO TABS: 90.0000 mg | ORAL_TABLET | Freq: Two times a day (BID) | ORAL | Status: DC

## 2013-03-04 MED ORDER — NITROGLYCERIN 0.4 MG SL SUBL: 0.4000 mg | SUBLINGUAL_TABLET | SUBLINGUAL | Status: DC | PRN

## 2013-03-04 MED ORDER — ROSUVASTATIN CALCIUM 40 MG PO TABS: 40.0000 mg | ORAL_TABLET | Freq: Every day | ORAL | Status: DC

## 2013-03-04 MED ORDER — LISINOPRIL 20 MG PO TABS
20.0000 mg | ORAL_TABLET | Freq: Every day | ORAL | Status: DC
  Administered 2013-03-04: 20 mg via ORAL
  Filled 2013-03-04: qty 1

## 2013-03-04 MED ORDER — METOPROLOL TARTRATE 25 MG PO TABS: 12.5000 mg | ORAL_TABLET | Freq: Two times a day (BID) | ORAL | Status: DC

## 2013-03-04 MED ORDER — ROSUVASTATIN CALCIUM 20 MG PO TABS: 40.0000 mg | ORAL_TABLET | Freq: Every day | ORAL | Status: DC

## 2013-03-04 MED ORDER — LISINOPRIL 20 MG PO TABS: 20.0000 mg | ORAL_TABLET | Freq: Every day | ORAL | Status: DC

## 2013-03-04 NOTE — Discharge Summary (Signed)
Physician Discharge Summary  Patient ID: Jose Duarte MRN: [id number] DOB/AGE: 71-Jul-1942 71 y.o.  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 male who presented for evaluation of chest pain. Patient reported doing a lot of yard work . He was eating dinner and developed acute onset of chest pain. Pain radiated across chest and to arms bilaterally. Patient was home with his wife and they called EMS. EMS initial tracing was concerning for ST elevation on ekg. STEMI was called. He was having 8/10 pain. He two ASA at home. Also received NTG, Morphine, and Heparin bolus.  Previous diagnostic testing for coronary artery disease includes: cardiac catheterization and echocardiogram.    Previous history of cardiac disease includes Angina. Coronary artery disease risk factors include: advanced age (older than 18 for men, 1 for women), diabetes mellitus, dyslipidemia, hypertension and male gender. Patient denies history of CHF, coronary angioplasty, coronary artery stent, previous M.I. and valvular disease.   He was taken acutely to the cath lab which revealed a totally occluded ramus branch.  This was stented with a 3.0x16 mm Promus Premier DES stent postdilated 3.2 mm with 100% occlusion reduced to 0% and resumption of TIMI 3 flow.  The patient recovered and progressed very well.  He was started on brilinta.  Lisinopril was increased due to hypertension. HR apparently dropped into the 30's for a  short period of time during sheath pull. Likely a vagal response.  Beta blocker was not added at that time however, the following day he was started on a low dose of lopressor which appeared to reduce the episodes of NSVT.  He ambulated well with cardiac rehab but did experience some SOB which appears to have improved.  2D echo revealed an EF of 45-50%(see below).  The patient was seen by Dr. Rennis Golden who felt he was stable for DC home.  Consults: Cardiac rehab  Significant Diagnostic Studies: Left heart cath HEMODYNAMICS:  Central Aorta: 136/77  Left Ventricle: 136/18/29  ANGIOGRAPHY:  Left main coronary artery was a large vessel that trifurcated into moderate size LAD, a nubbin of a Ramus immediate vessel, and a small circumflex vessel.  Left anterior descending artery gave rise to a proximal diagonal vessel which had narrowing in its midsegment of 40%. The LAD between the first and second diagonal had diffuse 50% narrowing in the region of the septal perforating artery takeoff. The mid distal LAD was free of significant disease in barely reach the apex.  The ramus immediate vessel was totally occluded just after its takeoff from the left main.  The circumflex artery was small caliber vessel free of significant disease  Right carotid artery was moderate size vessel that had diffuse luminal irregularities and narrowings of 30-50% in its midsegment. The vessel supplied the PDA posterior lateral vessel.  Following percutaneous coronary intervention of the ramus intermediate vessel with PTCA, thrombectomy, stenting with a 3.0x16 mm Promus Premier DES stent, postdilated to 3.2 mm the 100% occlusion is reduced to 0%. The portion  of the stent extended to the ostium of the ramus vessel arising from the left main. There was brisk TIMI-3 flow. The ramus vessel is a large vessel which gave rise to 2 large branches which supplied the inferolateral and apical wall.  Left ventriculography revealed preserved global  contractility with an ejection fraction of approximately 45%. There was noted anterolateral hypocontractility noted on the RAO projection. An LAO projection was not performed  IMPRESSION:  Acute inferolateral ST segment elevation myocardial infarction secondary to total occlusion of a large dominant right proximal ramus intermediate vessel secondary to ulcerated plaque rupture with resultant thrombus requiring PTCA, thrombectomy, and stenting with a 3.0x16 mm Promus Premier DES stent postdilated 3.2 mm with 100% occlusion reduced to 0% and resumption of TIMI 3 flow.  Left anterior descending artery with diffuse 50% stenosis in its mid segment between the first and second diagonal with 40% narrowing in the first diagonal vessel.  Normal small left circumflex vessel  Large right coronary artery with diffuse 30-50% luminal irregularities of the mid segment.  Preserved LV function with moderate anterolateral hypocontractility  Angiomax, 180 mg brilinta, IC and IV nitroglycerin  Time from Cath Lab arrival to first balloon inflation 23 minutes.  Lennette Bihari, MD, [facility]  03/01/2013  Echo Study Conclusions  - Left ventricle: The cavity size was normal. There was mild to moderate concentric hypertrophy. Systolic function was mildly reduced. The estimated ejection fraction was in the range of 45% to 50%. Lateral wall hypokinesis. Doppler parameters are consistent with abnormal left ventricular relaxation (grade 1 diastolic dysfunction). The E/e' ratio is <10, suggesting normal LV fillng pressure. - Aortic valve: Trileaflet. Sclerosis without stenosis. Trivial regurgitation. - Mitral valve: Slight end-systolic prolapse of the leaflet tips. Trivial regurgitation. - Left atrium: The atrium was normal in size. - Inferior vena cava: The vessel was dilated; the respirophasic diameter changes were blunted (< 50%); findings are consistent with elevated central venous pressure.   Treatments: See  above  Discharge Exam: Blood pressure 120/60, pulse 60, temperature 98.5 F (36.9 C), temperature source Oral, resp. rate 18, height 6' (1.829 m), weight 199 lb 8.3 oz (90.5 kg), SpO2 94.00%.   Disposition: 01-Home or Self Care      Discharge Orders   Future Orders Complete By Expires   Amb Referral to Cardiac Rehabilitation  As directed    Comments:     Pt referred to [facility] for outpatient cardiac rehab   Diet - low sodium heart healthy  As directed    Discharge instructions  As directed    Comments:     No lifting more than a half gallon of milk or driving for three days.   Increase activity slowly  As directed        Medication List    STOP taking these medications       Enalapril-Hydrochlorothiazide 5-12.5 MG per tablet      TAKE these medications       aspirin 81 MG tablet  Take 81 mg by mouth daily.     lisinopril 20 MG tablet  Commonly known as:  PRINIVIL,ZESTRIL  Take 1 tablet (20 mg total) by mouth daily.     metoprolol tartrate 25 MG tablet  Commonly known as:  LOPRESSOR  Take 0.5 tablets (12.5 mg total) by mouth 2 (two) times daily.     nitroGLYCERIN 0.4 MG SL tablet  Commonly known as:  NITROSTAT  Place 1 tablet (0.4 mg total) under the tongue every 5 (five)  minutes x 3 doses as needed for chest pain.     pantoprazole 40 MG tablet  Commonly known as:  PROTONIX  Take 40 mg by mouth daily.     rosuvastatin 20 MG tablet  Commonly known as:  CRESTOR  Take 2 tablets (40 mg total) by mouth daily.     Ticagrelor 90 MG Tabs tablet  Commonly known as:  BRILINTA  Take 1 tablet (90 mg total) by mouth 2 (two) times daily.     VISINE 0.05 % ophthalmic solution  Generic drug:  tetrahydrozoline  Place 2 drops into both eyes 4 (four) times daily as needed (for eyes).       Follow-up Information   Follow up with Lennette Bihari, MD. (Our office scheduler will call you with the appt. date and time. )    Specialty:  Cardiology   Contact  information:   [address] [place] [address] [phone]      Greater than 30 minutes was spent completing the patient's discharge.    SignedWilburt Finlay 03/04/2013, 11:23 AM

## 2013-03-04 NOTE — Progress Notes (Signed)
Pt. Seen and examined. Agree with the NP/PA-C note as written.  Dyspnea has improved today. BP still slightly high. Agree with increasing lisinopril.  He reports being intolerant to lipitor with severe myalgias, therefore would discharge home on Crestor, but increase dose to 40 mg. Ok for discharge home today. EF 45-50% - prn lasix for now. Follow-up in 7-10 days with Dr. Tresa Endo or MLP.  Chrystie Nose, MD, [facility] Attending Cardiologist [facility]

## 2013-03-04 NOTE — Progress Notes (Signed)
Subjective: Breathing better  Objective: Vital signs in last 24 hours: Temp:  [98.5 F (36.9 C)-98.7 F (37.1 C)] 98.5 F (36.9 C) (09/14 0535) Pulse Rate:  [56-62] 56 (09/14 0535) Resp:  [18] 18 (09/14 0535) BP: (140-164)/(66-90) 164/90 mmHg (09/14 0535) SpO2:  [94 %-99 %] 94 % (09/14 0535) Last BM Date: 03/03/13  Intake/Output from previous day: 09/13 0701 - 09/14 0700 In: 240 [P.O.:240] Out: -  Intake/Output this shift:    Medications Current Facility-Administered Medications  Medication Dose Route Frequency Provider Last Rate Last Dose  . 0.9 %  sodium chloride infusion   Intravenous Continuous Gilda Crease, MD      . acetaminophen (TYLENOL) tablet 650 mg  650 mg Oral Q4H PRN Lennette Bihari, MD   650 mg at 03/02/13 0003  . aspirin chewable tablet 81 mg  81 mg Oral Daily Lennette Bihari, MD   81 mg at 03/03/13 0911  . atorvastatin (LIPITOR) tablet 80 mg  80 mg Oral q1800 Lennette Bihari, MD   80 mg at 03/03/13 1753  . insulin aspart (novoLOG) injection 0-5 Units  0-5 Units Subcutaneous QHS [place] C. Terressa Koyanagi, MD   2 Units at 03/01/13 2317  . insulin aspart (novoLOG) injection 0-9 Units  0-9 Units Subcutaneous TID WC [place] C. Terressa Koyanagi, MD   1 Units at 03/04/13 [phone]  . insulin aspart (novoLOG) injection 3 Units  3 Units Subcutaneous TID WC [place] C. Terressa Koyanagi, MD   3 Units at 03/04/13 [phone]  . lisinopril (PRINIVIL,ZESTRIL) tablet 10 mg  10 mg Oral Daily Marykay Lex, MD   10 mg at 03/03/13 0911  . metoprolol tartrate (LOPRESSOR) tablet 12.5 mg  12.5 mg Oral BID Marykay Lex, MD   12.5 mg at 03/03/13 2223  . nitroGLYCERIN (NITROSTAT) SL tablet 0.4 mg  0.4 mg Sublingual Q5 Min x 3 PRN [place] C. Balfour, MD      . nitroGLYCERIN 0.2 mg/mL in dextrose 5 % infusion  2-200 mcg/min Intravenous Continuous Lennette Bihari, MD   30 mcg/min at 03/01/13 2358  . ondansetron (ZOFRAN) injection 4 mg  4 mg Intravenous Q6H PRN Lennette Bihari, MD      . pantoprazole  (PROTONIX) EC tablet 40 mg  40 mg Oral Q0600 Lennette Bihari, MD   40 mg at 03/03/13 0911  . Ticagrelor (BRILINTA) tablet 90 mg  90 mg Oral BID Lennette Bihari, MD   90 mg at 03/03/13 2223    PE: General appearance: alert, cooperative and no distress  Lungs: clear to auscultation bilaterally  Heart: regular rate and rhythm, S1, S2 normal, no murmur, click, rub or gallop  Extremities: No LEE  Pulses: 2+ and symmetric  Neurologic: Grossly normal   Lab Results:   Recent Labs  03/01/13 2045 03/02/13 0445  WBC 7.0 6.6  HGB 14.4 12.4*  HCT 40.6 36.1*  PLT 161 161   BMET  Recent Labs  03/01/13 2045 03/02/13 0445  NA 138 138  K 3.8 3.9  CL 100 105  CO2 26 22  GLUCOSE 195* 136*  BUN 18 18  CREATININE 1.03 0.83  CALCIUM 9.6 8.8   PT/INR  Recent Labs  03/01/13 2045 03/02/13 0445  LABPROT 13.4 15.1  INR 1.04 1.22   Cholesterol  Recent Labs  03/02/13 0445  CHOL 97   2D Echo Study Conclusions  - Left ventricle: The cavity size was normal. There was mild to moderate concentric hypertrophy. Systolic function was  mildly reduced. The estimated ejection fraction was in the range of 45% to 50%. Lateral wall hypokinesis. Doppler parameters are consistent with abnormal left ventricular relaxation (grade 1 diastolic dysfunction). The E/e' ratio is <10, suggesting normal LV fillng pressure. - Aortic valve: Trileaflet. Sclerosis without stenosis. Trivial regurgitation. - Mitral valve: Slight end-systolic prolapse of the leaflet tips. Trivial regurgitation. - Left atrium: The atrium was normal in size. - Inferior vena cava: The vessel was dilated; the respirophasic diameter changes were blunted (< 50%); findings are consistent with elevated central venous pressure.   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 ASA, brilinta. BP high. Will increase lisinopril to 20mg . Low dose lopressor.  No further NSVT.  Ef 45-50% with grade one diastolic dysfunction.  Sodium restriction and wt monitoring discussed.      LOS: 3 days    Jose Duarte 03/04/2013 8:57 AM

## 2013-03-13 ENCOUNTER — Encounter: Payer: Self-pay | Admitting: Cardiovascular Disease

## 2013-03-13 ENCOUNTER — Ambulatory Visit (INDEPENDENT_AMBULATORY_CARE_PROVIDER_SITE_OTHER): Payer: Medicare Other | Admitting: Cardiovascular Disease

## 2013-03-13 VITALS — BP 118/82 | HR 42 | Ht 71.0 in | Wt 194.1 lb

## 2013-03-13 DIAGNOSIS — Z9861 Coronary angioplasty status: Secondary | ICD-10-CM

## 2013-03-13 DIAGNOSIS — I2510 Atherosclerotic heart disease of native coronary artery without angina pectoris: Secondary | ICD-10-CM

## 2013-03-13 DIAGNOSIS — E785 Hyperlipidemia, unspecified: Secondary | ICD-10-CM

## 2013-03-13 DIAGNOSIS — E118 Type 2 diabetes mellitus with unspecified complications: Secondary | ICD-10-CM

## 2013-03-13 DIAGNOSIS — I10 Essential (primary) hypertension: Secondary | ICD-10-CM

## 2013-03-13 MED ORDER — OLMESARTAN MEDOXOMIL 20 MG PO TABS: 20.0000 mg | ORAL_TABLET | Freq: Every day | ORAL | Status: DC

## 2013-03-13 NOTE — Patient Instructions (Signed)
Your physician has requested that you have en exercise stress myoview.this will be done in 6-8 weeks.  For further information please visit [email or web address] Please follow instruction sheet, as given.  Your physician has recommended you make the following change in your medication: STOP the lisinopril. Start the generic Benicar. This has already been sent to your pharmacy.  Your physician recommends that you schedule a follow-up appointment in: November

## 2013-03-13 NOTE — Progress Notes (Signed)
Patient ID: Jose Duarte, male   DOB: May 30, 1941, 72 y.o.   MRN: [id number]     HPI: Jose Duarte, is a 72 y.o. male who presents for cardiology evaluation following his recent ST segment elevation myocardial infarction.  [name] develop an acute inferolateral ST segment elevation myocardial infarction on the evening of 03/01/2013. He presented in by EMS from [place] was [facility] where acute catheterization by me reveal a ramus intermediate vessel to be totally occluded chest beyond the ostium. He underwent successful PTCA as well as thrombectomy due to significant clot burden in the setting of this ulcerated plaque. With reperfusion he developed an idioventricular rhythm and received IV Lopressor. Ultimately a 3.0x16 mm Promus Premier DES stent was inserted and postdilated to 3.25 mm. He was also found to have mild-to-moderate concomitant CAD with diffuse narrowing of 50% in the LAD and 40% narrowing in the mid-diagonal vessel. His right coronary artery had 30-50% narrowings in its mid segment. Acute ejection fraction was approximately 45% pure a subsequent echo the following day and showed an EF of 45-50%. There was evidence for aortic sclerosis without stenosis. He was ultimately discharged the Sunday following his Thursday evening acute presentation. He was discharged on lisinopril which had been titrated up to 20 mg, metoprolol tartrate 12.5 mg twice a day Crestor 40 mg aspirin 81 mg in addition to Brilinta  90 twice a day.  Since discharge, he has felt well. He denies recurrent chest pain symptoms. He has noticed a cough it is dry and nonproductive. He tells me he'll be having an orientation to start at the end of the week for cardiac rehabilitation at [facility]. He also has a history of hyperlipidemia with high triglycerides in the past.  Past Medical History  Diagnosis Date  . GERD (gastroesophageal reflux disease)   . Hyperlipidemia     . Stroke     TIA  . Hypertension   . Abdominal pain   . Diabetes mellitus without complication   . PONV (postoperative nausea and vomiting)   . Sleep apnea     sleep study- 20years ago - does not wear CPAP  . ST elevation myocardial infarction (STEMI) of inferolateral wall, initial episode of care 03/01/2013    Proximal Ramus Occlusion - PCI with Promus Premier DES   . CAD S/P percutaneous coronary angioplasty - PCI RCA 03/01/2013    Promus Premier DES 3.0 mm x 16 mm - post-dilated to 3.2 mm - Dr. Tresa Endo   . HTN (hypertension), benign 03/02/2013    Past Surgical History  Procedure Laterality Date  . Neck surgery  1960's or 70's  . Cholecystectomy  2007  . Colon surgery  2011  . Inguinal hernia repair Left 09/18/2012    Procedure: LAPAROSCOPIC INGUINAL HERNIA;  Surgeon: Axel Filler, MD;  Location: [facility];  Service: General;  Laterality: Left;  Marland Kitchen Ventral hernia repair N/A 09/18/2012    Procedure: LAPAROSCOPIC VENTRAL HERNIA;  Surgeon: Axel Filler, MD;  Location: [facility];  Service: General;  Laterality: N/A;  . Insertion of mesh Left 09/18/2012    Procedure: INSERTION OF MESH;  Surgeon: Axel Filler, MD;  Location: [facility];  Service: General;  Laterality: Left;  . Laparoscopic lysis of adhesions N/A 09/18/2012    Procedure: LAPAROSCOPIC LYSIS OF ADHESIONS;  Surgeon: Axel Filler, MD;  Location: [facility];  Service: General;  Laterality: N/A;    Allergies  Allergen Reactions  . Penicillins Anaphylaxis    Knots  on head; tongue swelling    Current Outpatient Prescriptions  Medication Sig Dispense Refill  . aspirin 81 MG tablet Take 81 mg by mouth daily.      Marland Kitchen lisinopril (PRINIVIL,ZESTRIL) 20 MG tablet Take 1 tablet (20 mg total) by mouth daily.  30 tablet  5  . metoprolol tartrate (LOPRESSOR) 25 MG tablet Take 0.5 tablets (12.5 mg total) by mouth 2 (two) times daily.  30 tablet  5  . nitroGLYCERIN (NITROSTAT) 0.4 MG SL tablet Place 1 tablet (0.4 mg total) under the tongue every  5 (five) minutes x 3 doses as needed for chest pain.  25 tablet  12  . pantoprazole (PROTONIX) 40 MG tablet Take 40 mg by mouth daily.       . rosuvastatin (CRESTOR) 40 MG tablet Take 1 tablet (40 mg total) by mouth daily.  30 tablet  5  . Ticagrelor (BRILINTA) 90 MG TABS tablet Take 1 tablet (90 mg total) by mouth 2 (two) times daily.  60 tablet  10   No current facility-administered medications for this visit.    History   Social History  . Marital Status: Married    Spouse Name: N/A    Number of Children: N/A  . Years of Education: N/A   Occupational History  . Not on file.   Social History Main Topics  . Smoking status: Never Smoker   . Smokeless tobacco: Former Neurosurgeon    Types: Chew     Comment: quit 2003  . Alcohol Use: No  . Drug Use: No  . Sexual Activity: Not on file   Other Topics Concern  . Not on file   Social History Narrative  . No narrative on file    Family History  Problem Relation Age of Onset  . Cancer Sister     lung    ROS is negative for fevers, chills or night sweats. He denies visual symptoms. He does note a dry cough. He denies wheezing. He denies recurrent chest pain. He is unaware of palpitations. He denies bleeding. He denies abdominal pain change in bowel or bladder habits. Denies any urinary symptoms. He denies myalgias. He denies rash. He denies edema  Other system review is negative.  PE BP 118/82  Pulse 42  Ht 5\' 11"  (1.803 m)  Wt 194 lb 1.6 oz (88.043 kg)  BMI 27.08 kg/m2  General: Alert, oriented, no distress.  Skin: normal turgor, no rashes HEENT: Normocephalic, atraumatic. Pupils round and reactive; sclera anicteric;no lid lag.  Nose without nasal septal hypertrophy Mouth/Parynx benign; Mallinpatti scale 3 Neck: No JVD, no carotid briuts Lungs: clear to ausculatation and percussion; no wheezing or rales Heart: RRR, s1 s2 normal faint 1/6 systolic murmur Abdomen: soft, nontender; no hepatosplenomehaly, BS+; abdominal aorta  nontender and not dilated by palpation. Pulses 2+ right groin catheterization site state Extremities: no clubbing cyanosis or edema, Homan's sign negative  Neurologic: grossly nonfocal Psychologic: Normal affect and mood  ECG: Sinus bradycardia at 42 beats per minute. Early transition suggesting posterior wall involvement. Inferolateral T wave inversion II, III, and F V5 and V6 according with evolutionary changes of his recent myocardial infarction   LABS:  BMET    Component Value Date/Time   NA 138 03/02/2013 0445   K 3.9 03/02/2013 0445   CL 105 03/02/2013 0445   CO2 22 03/02/2013 0445   GLUCOSE 136* 03/02/2013 0445   BUN 18 03/02/2013 0445   CREATININE 0.83 03/02/2013 0445   CREATININE 0.89 07/14/2012 1040  CALCIUM 8.8 03/02/2013 0445   GFRNONAA 87* 03/02/2013 0445   GFRAA >90 03/02/2013 0445     Hepatic Function Panel     Component Value Date/Time   PROT 6.7 03/01/2013 2045   ALBUMIN 3.8 03/01/2013 2045   AST 30 03/01/2013 2045   ALT 26 03/01/2013 2045   ALKPHOS 57 03/01/2013 2045   BILITOT 0.5 03/01/2013 2045     CBC    Component Value Date/Time   WBC 6.6 03/02/2013 0445   RBC 4.03* 03/02/2013 0445   HGB 12.4* 03/02/2013 0445   HCT 36.1* 03/02/2013 0445   PLT 161 03/02/2013 0445   MCV 89.6 03/02/2013 0445   MCH 30.8 03/02/2013 0445   MCHC 34.3 03/02/2013 0445   RDW 13.5 03/02/2013 0445   LYMPHSABS 1.4 08/26/2009 0900   MONOABS 0.4 08/26/2009 0900   EOSABS 0.2 08/26/2009 0900   BASOSABS 0.0 08/26/2009 0900     BNP    Component Value Date/Time   PROBNP 74.4 03/01/2013 0032    Lipid Panel     Component Value Date/Time   CHOL 97 03/02/2013 0445   TRIG 123 03/02/2013 0445   HDL 34* 03/02/2013 0445   CHOLHDL 2.9 03/02/2013 0445   VLDL 25 03/02/2013 0445   LDLCALC 38 03/02/2013 0445     RADIOLOGY: No results found.    ASSESSMENT AND PLAN: My impression is that Jose Duarte is doing well now 12 days following his acute ST segment elevation large inferolateral myocardial infarction.  He had total occlusion of his proximal ramus immediate vessel just beyond the ostium and once opened this proved to be a very large ramus vessel which extended to the apex and gave rise to several branches. Acute ejection fraction was approximately 45%. He now notes a cough. I suspect this is due to a sedation. I recommend we discontinue this. I will start him on Benicar 20 mg in place of his lisinopril. He now is on Crestor at 40 mg and previously had been on a low dose prior to his event. He will be participating in cardiac rehabilitation at [facility]. An approximately 4-6 weeks, I am recommending that he undergo an exercise Myoview scan would be helpful both for exercise prescription is part of his cardiac rehabilitation program but also to document the extent of scar from his ramus intermediate MI and also to make certain he is not having ischemia his other vascular territories which do have concomitant moderate obstructive disease. All seem back in the office in November following his stress test and further recommendations will be made at that time.     Lennette Bihari, MD, [facility]  03/13/2013 11:25 AM

## 2013-03-16 ENCOUNTER — Encounter: Payer: Self-pay | Admitting: Cardiovascular Disease

## 2013-03-26 ENCOUNTER — Other Ambulatory Visit: Payer: Self-pay | Admitting: *Deleted

## 2013-04-05 ENCOUNTER — Encounter: Payer: Self-pay | Admitting: Cardiovascular Disease

## 2013-04-05 ENCOUNTER — Ambulatory Visit (INDEPENDENT_AMBULATORY_CARE_PROVIDER_SITE_OTHER)
Admission: RE | Admit: 2013-04-05 | Discharge: 2013-04-05 | Disposition: A | Discharge status: Home / Self Care | Payer: Medicare Other | Source: Ambulatory Visit | Attending: Cardiovascular Disease | Admitting: Cardiovascular Disease

## 2013-04-05 ENCOUNTER — Ambulatory Visit (INDEPENDENT_AMBULATORY_CARE_PROVIDER_SITE_OTHER): Payer: Medicare Other | Admitting: Cardiovascular Disease

## 2013-04-05 VITALS — BP 140/90 | HR 50 | Ht 71.0 in | Wt 193.0 lb

## 2013-04-05 DIAGNOSIS — R5383 Other fatigue: Secondary | ICD-10-CM

## 2013-04-05 DIAGNOSIS — R0609 Other forms of dyspnea: Secondary | ICD-10-CM

## 2013-04-05 DIAGNOSIS — E118 Type 2 diabetes mellitus with unspecified complications: Secondary | ICD-10-CM

## 2013-04-05 DIAGNOSIS — R0600 Dyspnea, unspecified: Secondary | ICD-10-CM

## 2013-04-05 DIAGNOSIS — I2119 ST elevation (STEMI) myocardial infarction involving other coronary artery of inferior wall: Secondary | ICD-10-CM

## 2013-04-05 DIAGNOSIS — I10 Essential (primary) hypertension: Secondary | ICD-10-CM

## 2013-04-05 DIAGNOSIS — R5381 Other malaise: Secondary | ICD-10-CM

## 2013-04-05 LAB — BASIC METABOLIC PANEL
BUN: 20 mg/dL (ref 6–23)
Calcium: 9.6 mg/dL (ref 8.4–10.5)
Chloride: 105 mEq/L (ref 96–112)
GFR: 64.04 mL/min (ref >60.00)
Glucose, Bld: 94 mg/dL (ref 70–99)
Potassium: 4.2 mEq/L (ref 3.5–5.1)
Sodium: 141 mEq/L (ref 135–145)

## 2013-04-05 LAB — BRAIN NATRIURETIC PEPTIDE: Pro B Natriuretic peptide (BNP): 78 pg/mL (ref 0.0–100.0)

## 2013-04-05 LAB — HEMATOCRIT: HCT: 40 % (ref 39.0–52.0)

## 2013-04-05 MED ORDER — TICAGRELOR 90 MG PO TABS: 90.0000 mg | ORAL_TABLET | Freq: Two times a day (BID) | ORAL | Status: DC

## 2013-04-05 MED ORDER — METOPROLOL TARTRATE 25 MG PO TABS: 12.5000 mg | ORAL_TABLET | Freq: Every day | ORAL | Status: DC

## 2013-04-05 MED ORDER — OLMESARTAN 10 MG HALF TABLET: ORAL_TABLET | ORAL | Status: DC

## 2013-04-05 NOTE — Assessment & Plan Note (Signed)
Postural symptoms since d/c  Decrease beta blocker and ACE  Echo to r/o pericardial effuson No rub on exam

## 2013-04-05 NOTE — Progress Notes (Signed)
Patient ID: Jose Duarte, male   DOB: 1941-06-16, 72 y.o.   MRN: [id number] 72 yo self referral. Previously seen by Dr Tresa Endo.  Did not like [facility] practice.  Wanted to change to [place].  On 03/01/13  He was taken acutely to the cath lab which revealed a totally occluded ramus branch. This was stented with a 3.0x16 mm Promus Premier DES stent postdilated 3.2 mm with 100% occlusion reduced to 0% and resumption of TIMI 3 flow. The patient recovered and progressed very well. He was started on brilinta. Lisinopril was increased due to hypertension. HR apparently dropped into the 30's for a short period of time during sheath pull. Likely a vagal response. Beta blocker was not added at that time however, the following day he was started on a low dose of lopressor which appeared to reduce the episodes of NSVT. He ambulated well with cardiac rehab but did experience some SOB which appears to have improved. 2D echo revealed an EF of 45-50%(see below). The patient was seen by Dr. Rennis Golden who felt he was stable for DC home.  Has not felt well since MI.  Has classic postural symptoms.  Feels his HR is too low.  New cough as well.  Exertional dyspnea worse. No chest pain Compliant with meds but they make him feel bad  ROS: Denies fever, malais, weight loss, blurry vision, decreased visual acuity, cough, sputum, SOB, hemoptysis, pleuritic pain, palpitaitons, heartburn, abdominal pain, melena, lower extremity edema, claudication, or rash.  All other systems reviewed and negative  BP only postural about 15 mmHg in office  145 systolic to 130 systolic and no pre syncope  General: Affect appropriate Healthy:  appears stated age HEENT: normal Neck supple with no adenopathy JVP normal no bruits no thyromegaly Lungs clear with no wheezing and good diaphragmatic motion Heart:  S1/S2 no murmur,rub, gallop or click PMI normal Abdomen: benighn, BS positve, no tenderness, no AAA no bruit.  No HSM or HJR Distal pulses  intact with no bruits No edema Neuro non-focal Skin warm and dry No muscular weakness  Medications Current Outpatient Prescriptions  Medication Sig Dispense Refill  . aspirin 81 MG tablet Take 81 mg by mouth daily.      . metoprolol tartrate (LOPRESSOR) 25 MG tablet Take 0.5 tablets (12.5 mg total) by mouth daily.  30 tablet  5  . nitroGLYCERIN (NITROSTAT) 0.4 MG SL tablet Place 1 tablet (0.4 mg total) under the tongue every 5 (five) minutes x 3 doses as needed for chest pain.  25 tablet  12  . olmesartan (BENICAR) 10 mg TABS tablet 1/2  TAB  30 tablet  6  . pantoprazole (PROTONIX) 40 MG tablet Take 40 mg by mouth daily.       . rosuvastatin (CRESTOR) 40 MG tablet Take 1 tablet (40 mg total) by mouth daily.  30 tablet  5  . Ticagrelor (BRILINTA) 90 MG TABS tablet Take 1 tablet (90 mg total) by mouth 2 (two) times daily.  60 tablet  11   No current facility-administered medications for this visit.    Allergies Penicillins  Family History: Family History  Problem Relation Age of Onset  . Cancer Sister     lung    Social History: History   Social History  . Marital Status: Married    Spouse Name: N/A    Number of Children: N/A  . Years of Education: N/A   Occupational History  . Not on file.   Social History Main  Topics  . Smoking status: Never Smoker   . Smokeless tobacco: Former Neurosurgeon    Types: Chew     Comment: quit 2003  . Alcohol Use: No  . Drug Use: No  . Sexual Activity: Not on file   Other Topics Concern  . Not on file   Social History Narrative  . No narrative on file    Electrocardiogram:  SR inferolateral T wave changes rate 42 03/13/13  Assessment and Plan

## 2013-04-05 NOTE — Assessment & Plan Note (Signed)
Continue dual antiplatlet Rx  F/U echo to assess EF given dyspnea and fatige  R/O pericardial effuson

## 2013-04-05 NOTE — Patient Instructions (Signed)
Your physician recommends that you schedule a follow-up appointment in: NEXT AVAILABLE WITH DR [facility] Your physician has recommended you make the following change in your medication:   DECREASE  METOPROLOL  TO 1/2  TAB DAILY  AND  DECREASE  BENICAR TO  20 MG  1 /2  EVERY DAY  A chest x-ray takes a picture of the organs and structures inside the chest, including the heart, lungs, and blood vessels. This test can show several things, including, whether the heart is enlarges; whether fluid is building up in the lungs; and whether pacemaker / defibrillator leads are still in place.  Your physician has requested that you have an echocardiogram. Echocardiography is a painless test that uses sound waves to create images of your heart. It provides your doctor with information about the size and shape of your heart and how well your heart's chambers and valves are working. This procedure takes approximately one hour. There are no restrictions for this procedure.  Your physician recommends that you return for lab work in: TODAY  BMET  BNP   HCT HGB SED  RATE

## 2013-04-05 NOTE — Assessment & Plan Note (Signed)
Discussed low carb diet.  Target hemoglobin A1c is 6.5 or less.  Continue current medications.  

## 2013-04-10 ENCOUNTER — Telehealth: Payer: Self-pay | Admitting: Cardiovascular Disease

## 2013-04-10 NOTE — Telephone Encounter (Signed)
PT'S  WIFE AWARE  OF LAB RESULTS./CY 

## 2013-04-10 NOTE — Telephone Encounter (Signed)
Follow up  ° °Pt returning call for results  °

## 2013-04-18 ENCOUNTER — Telehealth: Payer: Self-pay | Admitting: Cardiovascular Disease

## 2013-04-18 ENCOUNTER — Other Ambulatory Visit: Payer: Self-pay

## 2013-04-18 DIAGNOSIS — R0600 Dyspnea, unspecified: Secondary | ICD-10-CM

## 2013-04-18 MED ORDER — OLMESARTAN 10 MG HALF TABLET: ORAL_TABLET | ORAL | Status: DC

## 2013-04-18 NOTE — Telephone Encounter (Signed)
New problem    Pt needs Benica 10 mg called in to [facility].    Pt going out of town 10/31 Fri.   Thanks!

## 2013-04-19 ENCOUNTER — Other Ambulatory Visit: Payer: Self-pay | Admitting: *Deleted

## 2013-04-19 ENCOUNTER — Telehealth: Payer: Self-pay | Admitting: *Deleted

## 2013-04-19 ENCOUNTER — Other Ambulatory Visit: Payer: Self-pay

## 2013-04-19 DIAGNOSIS — R0600 Dyspnea, unspecified: Secondary | ICD-10-CM

## 2013-04-19 MED ORDER — OLMESARTAN 10 MG HALF TABLET: ORAL_TABLET | ORAL | Status: DC

## 2013-04-19 MED ORDER — OLMESARTAN MEDOXOMIL 20 MG PO TABS: ORAL_TABLET | ORAL | Status: DC

## 2013-04-19 NOTE — Telephone Encounter (Signed)
Refill complete 

## 2013-04-20 ENCOUNTER — Ambulatory Visit (HOSPITAL_COMMUNITY): Payer: Medicare Other | Attending: Internal Medicine | Admitting: Cardiology

## 2013-04-20 ENCOUNTER — Telehealth: Payer: Self-pay | Admitting: *Deleted

## 2013-04-20 DIAGNOSIS — I059 Rheumatic mitral valve disease, unspecified: Secondary | ICD-10-CM | POA: Insufficient documentation

## 2013-04-20 DIAGNOSIS — R0600 Dyspnea, unspecified: Secondary | ICD-10-CM

## 2013-04-20 DIAGNOSIS — I252 Old myocardial infarction: Secondary | ICD-10-CM | POA: Insufficient documentation

## 2013-04-20 DIAGNOSIS — R0609 Other forms of dyspnea: Secondary | ICD-10-CM | POA: Insufficient documentation

## 2013-04-20 DIAGNOSIS — R9430 Abnormal result of cardiovascular function study, unspecified: Secondary | ICD-10-CM

## 2013-04-20 DIAGNOSIS — R0989 Other specified symptoms and signs involving the circulatory and respiratory systems: Secondary | ICD-10-CM | POA: Insufficient documentation

## 2013-04-20 DIAGNOSIS — I079 Rheumatic tricuspid valve disease, unspecified: Secondary | ICD-10-CM | POA: Insufficient documentation

## 2013-04-20 DIAGNOSIS — I10 Essential (primary) hypertension: Secondary | ICD-10-CM | POA: Insufficient documentation

## 2013-04-20 DIAGNOSIS — E119 Type 2 diabetes mellitus without complications: Secondary | ICD-10-CM | POA: Insufficient documentation

## 2013-04-20 NOTE — Telephone Encounter (Signed)
PT  AWARE OF  ECHO RESULTS  PER DR NISHAN   PT NEEDS  CARDIAC MRI TO  ASSESS  EF   AND  SCAR /INFARCT WILL FORWARD MESSAGE TO  SCHEDULER  TO SET UP  PT REQUESTS CALL HOME  NUMBER  AND  LEAVE MESSAGE IF NO ANSWER CELL PHONE  RECEPTION NOT  ALWAYS  GOOS./CY

## 2013-04-20 NOTE — Progress Notes (Signed)
Echo performed. 

## 2013-04-25 ENCOUNTER — Ambulatory Visit (INDEPENDENT_AMBULATORY_CARE_PROVIDER_SITE_OTHER): Payer: Medicare Other | Admitting: Cardiovascular Disease

## 2013-04-25 ENCOUNTER — Telehealth: Payer: Self-pay | Admitting: Cardiovascular Disease

## 2013-04-25 ENCOUNTER — Encounter: Payer: Self-pay | Admitting: Cardiovascular Disease

## 2013-04-25 VITALS — BP 109/63 | HR 56 | Ht 71.5 in | Wt 194.4 lb

## 2013-04-25 DIAGNOSIS — I428 Other cardiomyopathies: Secondary | ICD-10-CM

## 2013-04-25 DIAGNOSIS — R9439 Abnormal result of other cardiovascular function study: Secondary | ICD-10-CM

## 2013-04-25 DIAGNOSIS — E118 Type 2 diabetes mellitus with unspecified complications: Secondary | ICD-10-CM

## 2013-04-25 DIAGNOSIS — I2119 ST elevation (STEMI) myocardial infarction involving other coronary artery of inferior wall: Secondary | ICD-10-CM

## 2013-04-25 DIAGNOSIS — I429 Cardiomyopathy, unspecified: Secondary | ICD-10-CM

## 2013-04-25 DIAGNOSIS — R9430 Abnormal result of cardiovascular function study, unspecified: Secondary | ICD-10-CM

## 2013-04-25 DIAGNOSIS — R0989 Other specified symptoms and signs involving the circulatory and respiratory systems: Secondary | ICD-10-CM

## 2013-04-25 DIAGNOSIS — I951 Orthostatic hypotension: Secondary | ICD-10-CM | POA: Insufficient documentation

## 2013-04-25 DIAGNOSIS — E785 Hyperlipidemia, unspecified: Secondary | ICD-10-CM

## 2013-04-25 DIAGNOSIS — R931 Abnormal findings on diagnostic imaging of heart and coronary circulation: Secondary | ICD-10-CM

## 2013-04-25 DIAGNOSIS — I255 Ischemic cardiomyopathy: Secondary | ICD-10-CM | POA: Insufficient documentation

## 2013-04-25 NOTE — Telephone Encounter (Signed)
Message forwarded to Dr. Kelly/Wanda, CMA.  

## 2013-04-25 NOTE — Telephone Encounter (Signed)
Faxed over a request for an order for Cardiac Rehab on 03-14-13,still have not received it back. She is faxing another one over now.Please send back asap.

## 2013-04-25 NOTE — Assessment & Plan Note (Signed)
Stable with no angina and good activity level.  Continue medical Rx  

## 2013-04-25 NOTE — Assessment & Plan Note (Signed)
Discussed low carb diet.  Target hemoglobin A1c is 6.5 or less.  Continue current medications. May be contributing to autonomic dysfunction

## 2013-04-25 NOTE — Assessment & Plan Note (Signed)
Improved with lowering of meds.  BP 125 sitting and 115 standing today.  Continue current course

## 2013-04-25 NOTE — Telephone Encounter (Signed)
Haven't received form as of yet. Dr. Tresa Endo not in the office today. Will wait on form and give it to him for his signature once I receive it and return it to them.

## 2013-04-25 NOTE — Assessment & Plan Note (Signed)
Cholesterol is at goal.  Continue current dose of statin and diet Rx.  No myalgias or side effects.  F/U  LFT's in 6 months. Lab Results  Component Value Date   LDLCALC 38 03/02/2013             

## 2013-04-25 NOTE — Assessment & Plan Note (Signed)
Will get MRI  Refer to EP for AICD evaluation.

## 2013-04-25 NOTE — Progress Notes (Signed)
Patient ID: Jose Duarte, male   DOB: 1941/03/03, 72 y.o.   MRN: [id number] 72 yo self referral. Previously seen by Dr Tresa Endo. Did not like [facility] practice. Wanted to change to [place]. On 03/01/13 He was taken acutely to the cath lab which revealed a totally occluded ramus branch. This was stented with a 3.0x16 mm Promus Premier DES stent postdilated 3.2 mm with 100% occlusion reduced to 0% and resumption of TIMI 3 flow. The patient recovered and progressed very well. He was started on brilinta. Lisinopril was increased due to hypertension. HR apparently dropped into the 30's for a short period of time during sheath pull. Likely a vagal response. Beta blocker was not added at that time however, the following day he was started on a low dose of lopressor which appeared to reduce the episodes of NSVT. He ambulated well with cardiac rehab but did experience some SOB which appears to have improved. 2D echo revealed an EF of 45-50%(see below). The patient was seen by Dr. Rennis Golden who felt he was stable for DC home.  Has not felt well since MI. Has classic postural symptoms. Feels his HR is too low. New cough as well. Exertional dyspnea worse. No chest pain Compliant with meds but they make him feel bad  Last visit meds lowered lopressor 12.5 daily and on ARB not ACE   Echo 04/20/13 Study Conclusions  - Left ventricle: Difficult acoustic windows limit study. LVEF is approximately 35% with diffuse hypokinesis, worse in the inferior and lateral walls. The cavity size was normal. Wall thickness was normal. Doppler parameters are consistent with abnormal left ventricular relaxation (grade 1 diastolic dysfunction). - Mitral valve: Mild regurgitation.  CXR 10/16 normal  BNP normal 68   He is ok with maintaining currentl lower dose of meds. Still with some postural symptoms.  Needs MRI to quantitate EF and scar For AICD evaluation.     ROS: Denies fever, malais, weight loss, blurry vision, decreased visual  acuity, cough, sputum, SOB, hemoptysis, pleuritic pain, palpitaitons, heartburn, abdominal pain, melena, lower extremity edema, claudication, or rash.  All other systems reviewed and negative  General: Affect appropriate Healthy:  appears stated age HEENT: normal Neck supple with no adenopathy JVP normal no bruits no thyromegaly Lungs clear with no wheezing and good diaphragmatic motion Heart:  S1/S2 no murmur, no rub, gallop or click PMI normal Abdomen: benighn, BS positve, no tenderness, no AAA no bruit.  No HSM or HJR Distal pulses intact with no bruits No edema Neuro non-focal Skin warm and dry No muscular weakness   Current Outpatient Prescriptions  Medication Sig Dispense Refill  . aspirin 81 MG tablet Take 81 mg by mouth daily.      . metoprolol tartrate (LOPRESSOR) 25 MG tablet Take 0.5 tablets (12.5 mg total) by mouth daily.  30 tablet  5  . nitroGLYCERIN (NITROSTAT) 0.4 MG SL tablet Place 1 tablet (0.4 mg total) under the tongue every 5 (five) minutes x 3 doses as needed for chest pain.  25 tablet  12  . olmesartan (BENICAR) 20 MG tablet Take 1/2 tab daily  30 tablet  6  . pantoprazole (PROTONIX) 40 MG tablet Take 40 mg by mouth daily.       . rosuvastatin (CRESTOR) 40 MG tablet Take 1 tablet (40 mg total) by mouth daily.  30 tablet  5  . Ticagrelor (BRILINTA) 90 MG TABS tablet Take 1 tablet (90 mg total) by mouth 2 (two) times daily.  60 tablet  11   No current facility-administered medications for this visit.    Allergies  Penicillins  Electrocardiogram:  Assessment and Plan

## 2013-04-25 NOTE — Patient Instructions (Signed)
Your physician recommends that you schedule a follow-up appointment in:  3 MONTHS WITH DR Eden Emms  AND  NEEDS  TO  SEE EP NEXT AVAILABLE POSSIBLE ICD AICD  IMPLANT Your physician recommends that you continue on your current medications as directed. Please refer to the Current Medication list given to you today.  Your physician has requested that you have a cardiac MRI. Cardiac MRI uses a computer to create images of your heart as its beating, producing both still and moving pictures of your heart and major blood vessels. For further information please visit [email or web address]. Please follow the instruction sheet given to you today for more information.

## 2013-04-27 ENCOUNTER — Telehealth: Payer: Self-pay | Admitting: *Deleted

## 2013-04-27 NOTE — Telephone Encounter (Signed)
Faxed cardiac rehab order back signed by Dr. Tresa Endo.

## 2013-05-01 ENCOUNTER — Encounter: Payer: Self-pay | Admitting: Cardiovascular Disease

## 2013-05-02 ENCOUNTER — Encounter (HOSPITAL_COMMUNITY): Payer: Self-pay

## 2013-05-02 ENCOUNTER — Encounter: Payer: Self-pay | Admitting: *Deleted

## 2013-05-08 ENCOUNTER — Encounter: Payer: Self-pay | Admitting: Internal Medicine

## 2013-05-08 ENCOUNTER — Ambulatory Visit (INDEPENDENT_AMBULATORY_CARE_PROVIDER_SITE_OTHER): Payer: Medicare Other | Admitting: Internal Medicine

## 2013-05-08 VITALS — BP 116/71 | HR 50 | Ht 71.5 in | Wt 196.8 lb

## 2013-05-08 DIAGNOSIS — I498 Other specified cardiac arrhythmias: Secondary | ICD-10-CM

## 2013-05-08 DIAGNOSIS — R9439 Abnormal result of other cardiovascular function study: Secondary | ICD-10-CM

## 2013-05-08 DIAGNOSIS — R9430 Abnormal result of cardiovascular function study, unspecified: Secondary | ICD-10-CM

## 2013-05-08 DIAGNOSIS — I951 Orthostatic hypotension: Secondary | ICD-10-CM

## 2013-05-08 DIAGNOSIS — G459 Transient cerebral ischemic attack, unspecified: Secondary | ICD-10-CM

## 2013-05-08 DIAGNOSIS — R0989 Other specified symptoms and signs involving the circulatory and respiratory systems: Secondary | ICD-10-CM

## 2013-05-08 DIAGNOSIS — R001 Bradycardia, unspecified: Secondary | ICD-10-CM | POA: Insufficient documentation

## 2013-05-08 DIAGNOSIS — I428 Other cardiomyopathies: Secondary | ICD-10-CM

## 2013-05-08 DIAGNOSIS — I429 Cardiomyopathy, unspecified: Secondary | ICD-10-CM

## 2013-05-08 DIAGNOSIS — R931 Abnormal findings on diagnostic imaging of heart and coronary circulation: Secondary | ICD-10-CM

## 2013-05-08 NOTE — Progress Notes (Signed)
[facility]   ELECTROPHYSIOLOGY CONSULT NOTE  Patient ID: Jose Duarte, MRN: [id number], DOB/AGE: Nov 25, 1940 72 y.o. Admit date: (Not on file) Date of Consult: 05/08/2013  Primary Physician: Kaleen Mask, MD Primary Cardiologist: PN prev TK  Chief Complaint: ICD   HPI Jose Duarte is a 72 y.o. male  Seen for consideration of an ICD.  He is a history of coronary artery disease. 9/14 he underwent stenting of the totally occluded ramus branch. Post procedure ejection fraction was 45-50%. Echocardiogram 10/14 demonstrated LV function of 35% with regional hypokinesis; he also had left atrial enlargement. In this regard should be noted he has a prior TIA. He does not have a diagnosis of atrial fibrillation.  Sheath removal is associated with bradycardia thought to be vagal. He has a history of orthostatic intolerance which has been worse since hospitalization and initiation of beta blockers and ARBs   He also has modest dyspnea on exertion. This is unaccompanied by chest pain. He has been noted to have some modest bradycardia.  He has diabetes.     Past Medical History  Diagnosis Date  . GERD (gastroesophageal reflux disease)   . Hyperlipidemia   . Stroke     TIA  . Hypertension   . Abdominal pain   . Diabetes mellitus without complication   . PONV (postoperative nausea and vomiting)   . Sleep apnea     sleep study- 20years ago - does not wear CPAP  . ST elevation myocardial infarction (STEMI) of inferolateral wall, initial episode of care 03/01/2013    Proximal Ramus Occlusion - PCI with Promus Premier DES   . CAD S/P percutaneous coronary angioplasty - PCI RCA 03/01/2013    Promus Premier DES 3.0 mm x 16 mm - post-dilated to 3.2 mm - Dr. Tresa Endo   . HTN (hypertension), benign 03/02/2013      Surgical History:  Past Surgical History  Procedure Laterality Date  . Neck surgery  1960's or 70's  . Cholecystectomy  2007  . Colon surgery  2011  . Inguinal hernia repair  Left 09/18/2012    Procedure: LAPAROSCOPIC INGUINAL HERNIA;  Surgeon: Axel Filler, MD;  Location: [facility];  Service: General;  Laterality: Left;  Marland Kitchen Ventral hernia repair N/A 09/18/2012    Procedure: LAPAROSCOPIC VENTRAL HERNIA;  Surgeon: Axel Filler, MD;  Location: [facility];  Service: General;  Laterality: N/A;  . Insertion of mesh Left 09/18/2012    Procedure: INSERTION OF MESH;  Surgeon: Axel Filler, MD;  Location: [facility];  Service: General;  Laterality: Left;  . Laparoscopic lysis of adhesions N/A 09/18/2012    Procedure: LAPAROSCOPIC LYSIS OF ADHESIONS;  Surgeon: Axel Filler, MD;  Location: [facility];  Service: General;  Laterality: N/A;     Home Meds: Prior to Admission medications   Medication Sig Start Date End Date Taking? Authorizing Provider  aspirin 81 MG tablet Take 81 mg by mouth daily.   Yes Historical Provider, MD  metoprolol tartrate (LOPRESSOR) 25 MG tablet Take 0.5 tablets (12.5 mg total) by mouth daily. 04/05/13  Yes Wendall Stade, MD  nitroGLYCERIN (NITROSTAT) 0.4 MG SL tablet Place 1 tablet (0.4 mg total) under the tongue every 5 (five) minutes x 3 doses as needed for chest pain. 03/04/13  Yes Wilburt Finlay, PA-C  olmesartan (BENICAR) 20 MG tablet Take 1/2 tab daily 04/19/13  Yes Wendall Stade, MD  pantoprazole (PROTONIX) 40 MG tablet Take 40 mg by mouth daily.    Yes Historical Provider, MD  rosuvastatin (CRESTOR) 40 MG tablet Take 1 tablet (40 mg total) by mouth daily. 03/04/13  Yes Wilburt Finlay, PA-C  Ticagrelor (BRILINTA) 90 MG TABS tablet Take 1 tablet (90 mg total) by mouth 2 (two) times daily. 04/05/13  Yes Wendall Stade, MD     Allergies:  Allergies  Allergen Reactions  . Penicillins Anaphylaxis    Knots on head; tongue swelling    History   Social History  . Marital Status: Married    Spouse Name: N/A    Number of Children: N/A  . Years of Education: N/A   Occupational History  . Not on file.   Social History Main Topics  . Smoking status:  Never Smoker   . Smokeless tobacco: Former Neurosurgeon    Types: Chew     Comment: quit 2003  . Alcohol Use: No  . Drug Use: No  . Sexual Activity: Not on file   Other Topics Concern  . Not on file   Social History Narrative  . No narrative on file     Family History  Problem Relation Age of Onset  . Cancer Sister     lung     ROS:  Please see the history of present illness.     All other systems reviewed and negative.    Physical Exam:   Blood pressure 113/58, pulse 51, height 5' 11.5" (1.816 m), weight 196 lb 12.8 oz (89.268 kg). General: Well developed, well nourished male in no acute distress. Head: Normocephalic, atraumatic, sclera non-icteric, no xanthomas, nares are without discharge. EENT: normal Lymph Nodes:  none Back: without scoliosis/kyphosis , no CVA tendersness Neck: Negative for carotid bruits. JVD not elevated. Lungs: Clear bilaterally to auscultation without wheezes, rales, or rhonchi. Breathing is unlabored. Heart: RRR with S1 S2. +S4 murmur , rubs, or gallops appreciated. Abdomen: Soft, non-tender, non-distended with normoactive bowel sounds. No hepatomegaly. No rebound/guarding. Scar No obvious abdominal masses. Msk:  Strength and tone appear normal for age. Extremities: No clubbing or cyanosis. No  edema.  Distal pedal pulses are 2+ and equal bilaterally. Skin: Warm and Dry Neuro: Alert and oriented X 3. CN III-XII intact Grossly normal sensory and motor function . Psych:  Responds to questions appropriately with a normal affect.      Labs: Cardiac Enzymes No results found for this basename: CKTOTAL, CKMB, TROPONINI,  in the last 72 hours CBC Lab Results  Component Value Date   WBC 6.6 03/02/2013   HGB 13.7 04/05/2013   HCT 40.0 04/05/2013   MCV 89.6 03/02/2013   PLT 161 03/02/2013   PROTIME: No results found for this basename: LABPROT, INR,  in the last 72 hours Chemistry No results found for this basename: NA, K, CL, CO2, BUN, CREATININE,  CALCIUM, LABALBU, PROT, BILITOT, ALKPHOS, ALT, AST, GLUCOSE,  in the last 168 hours Lipids Lab Results  Component Value Date   CHOL 97 03/02/2013   HDL 34* 03/02/2013   LDLCALC 38 03/02/2013   TRIG 123 03/02/2013   BNP Pro B Natriuretic peptide (BNP)  Date/Time Value Range Status  04/05/2013 11:41 AM 78.0  0.0 - 100.0 pg/mL Final  03/01/2013 12:32 AM 74.4  0 - 125 pg/mL Final   Miscellaneous No results found for this basename: DDIMER    Radiology/Studies:  No results found.  EKG: Dated September 23 shows sinus rhythm at 42 Intervals 18/10/48 Access left it might 49 Problem inferoposterior MI   Assessment and Plan:    Sherryl Manges

## 2013-05-08 NOTE — Assessment & Plan Note (Signed)
The patient has had TIAs x2. With his history of palpitations I think is important to exclude atrial fibrillation. We reviewed the role of a LINQ monitor and will discuss this at his return visit

## 2013-05-08 NOTE — Assessment & Plan Note (Signed)
He has had significant bradycardia and nose heart rate of over 100 identifying at rehabilitation. Following discontinuation of his beta blocker, we will plan to undertake stress testing at his next visit to assess her chronotropic competence

## 2013-05-08 NOTE — Assessment & Plan Note (Signed)
He is not demonstrably of orthostatic today, however, his symptoms are quite striking. We'll decrease his beta blocker as noted. We have reviewed the physiology of orthostatic intolerance and its relation to diabetes. We have discussed isometric maneuvers to try to mitigate some of his symptoms and explained also the importance of exercise at the time of vasodilatation

## 2013-05-08 NOTE — Assessment & Plan Note (Signed)
The patient has left ventricular dysfunction one month following revascularization and MI. It is premature to consider ICD implantation. We will reschedule his MR for now until the end of December. I will see him thereafter to reassess the appropriateness for ICD implantation.  He has significant dizziness as well as hypotension documented at cardiac rehabilitation. We will discontinue his beta blocker as he is also bradycardic. We will continue him on his current dose of ARB for right now. If he continues to have problems with low blood pressure she will call to decrease his ARB dose.  We have reviewed the value of ARB therapy for cardiomyopathy as well as renal protection

## 2013-05-08 NOTE — Patient Instructions (Addendum)
Your physician has recommended you make the following change in your medication:  1) Stop Metoprolol Tartrate  Your physician has requested that you have a cardiac MRI rescheduled to late December.    Your physician has requested that you have an exercise tolerance test. For further information please visit [email or web address] Please also follow instruction sheet, as given.  Your physician recommends that you schedule a follow-up appointment with your exercise tolerance test.

## 2013-05-09 ENCOUNTER — Encounter: Payer: Self-pay | Admitting: Cardiology

## 2013-05-10 ENCOUNTER — Ambulatory Visit (HOSPITAL_COMMUNITY): Admission: RE | Admit: 2013-05-10 | Payer: Medicare Other | Source: Ambulatory Visit

## 2013-06-06 ENCOUNTER — Ambulatory Visit (HOSPITAL_COMMUNITY)
Admission: RE | Admit: 2013-06-06 | Discharge: 2013-06-06 | Disposition: A | Discharge status: Home / Self Care | Payer: Medicare Other | Source: Ambulatory Visit | Attending: Cardiovascular Disease | Admitting: Cardiovascular Disease

## 2013-06-06 DIAGNOSIS — I429 Cardiomyopathy, unspecified: Secondary | ICD-10-CM

## 2013-06-06 DIAGNOSIS — I059 Rheumatic mitral valve disease, unspecified: Secondary | ICD-10-CM | POA: Insufficient documentation

## 2013-06-06 DIAGNOSIS — I2589 Other forms of chronic ischemic heart disease: Secondary | ICD-10-CM | POA: Insufficient documentation

## 2013-06-06 DIAGNOSIS — R9430 Abnormal result of cardiovascular function study, unspecified: Secondary | ICD-10-CM

## 2013-06-06 LAB — CREATININE, SERUM: Creatinine, Ser: 0.9 mg/dL (ref 0.50–1.35)

## 2013-06-06 MED ORDER — GADOBENATE DIMEGLUMINE 529 MG/ML IV SOLN
30.0000 mL | Freq: Once | INTRAVENOUS | Status: AC
  Administered 2013-06-06: 30 mL via INTRAVENOUS

## 2013-06-07 ENCOUNTER — Telehealth: Payer: Self-pay | Admitting: Cardiovascular Disease

## 2013-06-07 NOTE — Telephone Encounter (Signed)
Follow Up  ° °Pt returned call//SR  °

## 2013-06-07 NOTE — Telephone Encounter (Signed)
PT  AWARE OF  CARDIAC  MRI RESULTS ./CY 

## 2013-07-03 ENCOUNTER — Encounter: Payer: Self-pay | Admitting: Physician Assistant

## 2013-07-12 ENCOUNTER — Ambulatory Visit (INDEPENDENT_AMBULATORY_CARE_PROVIDER_SITE_OTHER): Payer: Managed Care, Other (non HMO) | Admitting: Internal Medicine

## 2013-07-12 ENCOUNTER — Encounter: Payer: Self-pay | Admitting: Physician Assistant

## 2013-07-12 ENCOUNTER — Ambulatory Visit: Payer: Self-pay | Admitting: Internal Medicine

## 2013-07-12 ENCOUNTER — Ambulatory Visit: Payer: Medicare Other | Admitting: Internal Medicine

## 2013-07-12 DIAGNOSIS — R9439 Abnormal result of other cardiovascular function study: Secondary | ICD-10-CM

## 2013-07-12 DIAGNOSIS — I428 Other cardiomyopathies: Secondary | ICD-10-CM

## 2013-07-12 DIAGNOSIS — R001 Bradycardia, unspecified: Secondary | ICD-10-CM

## 2013-07-12 DIAGNOSIS — I429 Cardiomyopathy, unspecified: Secondary | ICD-10-CM

## 2013-07-12 DIAGNOSIS — I951 Orthostatic hypotension: Secondary | ICD-10-CM

## 2013-07-12 DIAGNOSIS — I498 Other specified cardiac arrhythmias: Secondary | ICD-10-CM

## 2013-07-12 DIAGNOSIS — R931 Abnormal findings on diagnostic imaging of heart and coronary circulation: Secondary | ICD-10-CM

## 2013-07-12 NOTE — Progress Notes (Signed)
Exercise Treadmill Test  Pre-Exercise Testing Evaluation Rhythm: sinus bradycardia  Rate: 57 bpm     Test  Exercise Tolerance Test Ordering MD: Virl Axe, MD  Interpreting MD: Virl Axe, MD  Unique Test No: 1  Treadmill:  1  Indication for ETT: chronotropic competence  Contraindication to ETT: No   Stress Modality: exercise - treadmill  Cardiac Imaging Performed: nuclear perfusion - technetium 57m   Protocol: standard Bruce - maximal  Max BP:  215/91/   Max MPHR (bpm):  149 85% MPR (bpm):  127  MPHR obtained (bpm):  80 % MPHR obtained:    Reached 85% MPHR (min:sec):   Total Exercise Time (min-sec):    Workload in METS:   Borg Scale:   Reason ETT Terminated:  answer    ST Segment Analysis At Rest: normal ST segments - no evidence of significant ST depression With Exercise: no evidence of significant ST depression  Other Information Arrhythmia:  Yes Angina during ETT:  absent (0) Quality of ETT:  diagnostic  ETT Interpretation:  normal - no evidence of ischemia by ST analysis  Comments: Chronotropic 75 % ok    Recommendations:  no further ep followup

## 2013-07-30 ENCOUNTER — Ambulatory Visit (INDEPENDENT_AMBULATORY_CARE_PROVIDER_SITE_OTHER): Payer: Managed Care, Other (non HMO) | Admitting: Cardiovascular Disease

## 2013-07-30 ENCOUNTER — Encounter: Payer: Self-pay | Admitting: Cardiovascular Disease

## 2013-07-30 VITALS — BP 138/84 | HR 68 | Ht 71.5 in | Wt 195.0 lb

## 2013-07-30 DIAGNOSIS — I10 Essential (primary) hypertension: Secondary | ICD-10-CM

## 2013-07-30 DIAGNOSIS — I255 Ischemic cardiomyopathy: Secondary | ICD-10-CM

## 2013-07-30 DIAGNOSIS — E785 Hyperlipidemia, unspecified: Secondary | ICD-10-CM

## 2013-07-30 DIAGNOSIS — I2510 Atherosclerotic heart disease of native coronary artery without angina pectoris: Secondary | ICD-10-CM

## 2013-07-30 DIAGNOSIS — E118 Type 2 diabetes mellitus with unspecified complications: Secondary | ICD-10-CM

## 2013-07-30 DIAGNOSIS — Z9861 Coronary angioplasty status: Secondary | ICD-10-CM

## 2013-07-30 DIAGNOSIS — I2589 Other forms of chronic ischemic heart disease: Secondary | ICD-10-CM

## 2013-07-30 MED ORDER — LOSARTAN POTASSIUM 25 MG PO TABS: 25.0000 mg | ORAL_TABLET | Freq: Every day | ORAL | Status: DC

## 2013-07-30 NOTE — Assessment & Plan Note (Signed)
Stable with no angina and good activity level.  Continue medical Rx Gave him option of taking generic plavix with P2Y testing but he preferred To stay on Brillinta  F/U September may stop one year post stenting

## 2013-07-30 NOTE — Patient Instructions (Signed)
Your physician wants you to follow-up in:   SEE DR Craig Beach will receive a reminder letter in the mail two months in advance. If you don't receive a letter, please call our office to schedule the follow-up appointment. Your physician has recommended you make the following change in your medication:  STOP  BENICAR START LOSARTAN 25 MG   EVER DAY

## 2013-07-30 NOTE — Progress Notes (Signed)
Patient ID: Jose Duarte, male   DOB: 12/11/1940, 73 y.o.   MRN: [id number] 73 yo self referral. Previously seen by [name]. Did not like [facility] practice. Wanted to change to [place]. On 03/01/13 He was taken acutely to the cath lab which revealed a totally occluded ramus branch. This was stented with a 3.0x16 mm Promus Premier DES stent postdilated 3.2 mm with 100% occlusion reduced to 0% and resumption of TIMI 3 flow. The patient recovered and progressed very well. He was started on brilinta. Lisinopril was increased due to hypertension. HR apparently dropped into the 30's for a short period of time during sheath pull. Likely a vagal response. Beta blocker was not added at that time however, the following day he was started on a low dose of lopressor which appeared to reduce the episodes of NSVT. He ambulated well with cardiac rehab but did experience some SOB which appears to have improved. 2D echo revealed an EF of 45-50%(see below). The patient was seen by Dr. Debara Pickett who felt he was stable for DC home.   Had fatigue and low heart rate on beta blocker which was eventually stopped   Echo 04/20/13  Study Conclusions  - Left ventricle: Difficult acoustic windows limit study. LVEF is approximately 35% with diffuse hypokinesis, worse in the inferior and lateral walls. The cavity size was normal. Wall thickness was normal. Doppler parameters are consistent with abnormal left ventricular relaxation (grade 1 diastolic dysfunction). - Mitral valve: Mild regurgitation.    Had ETT with Dr Caryl Comes and no chronotropic incompetence beta blockers stopped   MRI:  04/25/13  EF 47%    IMPRESSION: 1) Mild LVE and mild LVH EF 47%  2) Nearly full thickness scar involving the lateral and inferolateral walls  Concerned about cost of benicar and brillinta   ROS: Denies fever, malais, weight loss, blurry vision, decreased visual acuity, cough, sputum, SOB, hemoptysis, pleuritic pain, palpitaitons, heartburn,  abdominal pain, melena, lower extremity edema, claudication, or rash.  All other systems reviewed and negative  General: Affect appropriate Healthy:  appears stated age 73: normal Neck supple with no adenopathy JVP normal no bruits no thyromegaly Lungs clear with no wheezing and good diaphragmatic motion Heart:  S1/S2 no murmur, no rub, gallop or click PMI normal Abdomen: benighn, BS positve, no tenderness, no AAA no bruit.  No HSM or HJR Distal pulses intact with no bruits No edema Neuro non-focal Skin warm and dry No muscular weakness   Current Outpatient Prescriptions  Medication Sig Dispense Refill  . aspirin 81 MG tablet Take 81 mg by mouth daily.      . nitroGLYCERIN (NITROSTAT) 0.4 MG SL tablet Place 1 tablet (0.4 mg total) under the tongue every 5 (five) minutes x 3 doses as needed for chest pain.  25 tablet  12  . olmesartan (BENICAR) 20 MG tablet Take 1/2 tab daily  30 tablet  6  . pantoprazole (PROTONIX) 40 MG tablet Take 40 mg by mouth daily.       . rosuvastatin (CRESTOR) 40 MG tablet Take 1 tablet (40 mg total) by mouth daily.  30 tablet  5  . Ticagrelor (BRILINTA) 90 MG TABS tablet Take 1 tablet (90 mg total) by mouth 2 (two) times daily.  60 tablet  11   No current facility-administered medications for this visit.    Allergies  Penicillins  Electrocardiogram:  SR rate 42 inferolateral T wave changes   Assessment and Plan

## 2013-07-30 NOTE — Assessment & Plan Note (Signed)
Well controlled.  Continue current medications and low sodium Dash type diet.    

## 2013-07-30 NOTE — Assessment & Plan Note (Signed)
Euvolemic EF 47% by MRI no indication for AICD  Continue current meds

## 2013-07-30 NOTE — Assessment & Plan Note (Signed)
Cholesterol is at goal.  Continue current dose of statin and diet Rx.  No myalgias or side effects.  F/U  LFT's in 6 months. Lab Results  Component Value Date   LDLCALC 38 03/02/2013             

## 2013-07-30 NOTE — Assessment & Plan Note (Signed)
Resolved off beta blockers ETT with no chronotropic incompetence

## 2013-07-30 NOTE — Assessment & Plan Note (Signed)
A1c 6.8  Prescribed glucophage but hasn't taken it  Discussed compliance and low carb diet

## 2013-11-27 ENCOUNTER — Other Ambulatory Visit: Payer: Self-pay | Admitting: *Deleted

## 2013-11-27 MED ORDER — ROSUVASTATIN CALCIUM 40 MG PO TABS: 40.0000 mg | ORAL_TABLET | Freq: Every day | ORAL | Status: DC

## 2013-12-13 ENCOUNTER — Encounter (INDEPENDENT_AMBULATORY_CARE_PROVIDER_SITE_OTHER): Payer: Self-pay | Admitting: Surgery

## 2013-12-13 ENCOUNTER — Ambulatory Visit (INDEPENDENT_AMBULATORY_CARE_PROVIDER_SITE_OTHER): Payer: Medicare HMO | Admitting: Surgery

## 2013-12-13 VITALS — BP 115/60 | HR 49 | Temp 96.8°F | Resp 12 | Ht 71.0 in | Wt 179.8 lb

## 2013-12-13 DIAGNOSIS — K4091 Unilateral inguinal hernia, without obstruction or gangrene, recurrent: Secondary | ICD-10-CM

## 2013-12-13 NOTE — Patient Instructions (Signed)

## 2013-12-13 NOTE — Progress Notes (Signed)
Jose Duarte 73 y.o.  Body mass index is 25.09 kg/(m^2).  Patient Active Problem List   Diagnosis Date Noted  . Recurrent left inguinal hernia 12/13/2013  . TIA (transient ischemic attack) 05/08/2013  . Sinus bradycardia 05/08/2013  . 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 Ischemic cardiomyopathy 04/25/2013  . HTN (hypertension), benign 03/02/2013  . Diabetes mellitus type 2 with complications - CAD 15/52/0802  . Hyperlipidemia LDL goal <70 03/02/2013    Class: Chronic  . ST elevation myocardial infarction (STEMI) of inferior wall, initial episode of care - Ramus Intermedius Occlusion, PCI with DES Promus Premier 3.0 mm x 16 mm 03/01/2013    Class: Hospitalized for  . CAD S/P percutaneous coronary angioplasty - PCI Ramus 03/01/2013    Class: Acute  . Left inguinal hernia 08/07/2012  . Ventral hernia 07/11/2012  . Abdominal pain     Allergies  Allergen Reactions  . Penicillins Anaphylaxis    Knots on head; tongue swelling      Past Surgical History  Procedure Laterality Date  . Neck surgery  1960's or 70's  . Cholecystectomy  2007  . Colon surgery  2011  . Inguinal hernia repair Left 09/18/2012    Procedure: LAPAROSCOPIC INGUINAL HERNIA;  Surgeon: Ralene Ok, MD;  Location: [facility];  Service: General;  Laterality: Left;  Marland Kitchen Ventral hernia repair N/A 09/18/2012    Procedure: LAPAROSCOPIC VENTRAL HERNIA;  Surgeon: Ralene Ok, MD;  Location: [facility];  Service: General;  Laterality: N/A;  . Insertion of mesh Left 09/18/2012    Procedure: INSERTION OF MESH;  Surgeon: Ralene Ok, MD;  Location: [facility];  Service: General;  Laterality: Left;  . Laparoscopic lysis of adhesions N/A 09/18/2012    Procedure: LAPAROSCOPIC LYSIS OF ADHESIONS;  Surgeon: Ralene Ok, MD;  Location: [facility];  Service: General;  Laterality: N/A;  . Coronary stent placement  2336   Leonard Downing, MD 1. Recurrent left inguinal hernia     Patient is seen today with a recurrent left  inguinal hernia. He had a ventral hernia and a left inguinal hernia repaired by Dr. Marlou Starks and Rosendo Gros about a year ago in March. He had a heart attack in September and has been on a blood thinner presumably for his stents which he needs to stay on until September. He has developed an obvious recurrent left inguinal hernia that is reducible and soft and he would like for me to repair it. I think that I would like for him to come off his blood thinner before then saw that he would be best for [place] to wait until September and then schedule an open left inguinal herniorrhaphy with mesh. I explained the procedure to him and we will schedule it sometime in the fall. Matt B. Hassell Done, MD, [facility] [phone] beeper [phone]  12/13/2013 11:35 AM

## 2014-01-21 ENCOUNTER — Other Ambulatory Visit: Payer: Self-pay | Admitting: *Deleted

## 2014-01-21 MED ORDER — ROSUVASTATIN CALCIUM 40 MG PO TABS: 40.0000 mg | ORAL_TABLET | Freq: Every day | ORAL | Status: DC

## 2014-02-06 ENCOUNTER — Ambulatory Visit (INDEPENDENT_AMBULATORY_CARE_PROVIDER_SITE_OTHER): Payer: Managed Care, Other (non HMO) | Admitting: Cardiovascular Disease

## 2014-02-06 ENCOUNTER — Encounter: Payer: Self-pay | Admitting: Cardiovascular Disease

## 2014-02-06 VITALS — BP 124/86 | HR 52 | Ht 71.0 in | Wt 179.0 lb

## 2014-02-06 DIAGNOSIS — I10 Essential (primary) hypertension: Secondary | ICD-10-CM

## 2014-02-06 DIAGNOSIS — R001 Bradycardia, unspecified: Secondary | ICD-10-CM

## 2014-02-06 DIAGNOSIS — E118 Type 2 diabetes mellitus with unspecified complications: Secondary | ICD-10-CM

## 2014-02-06 DIAGNOSIS — R0989 Other specified symptoms and signs involving the circulatory and respiratory systems: Secondary | ICD-10-CM

## 2014-02-06 DIAGNOSIS — I498 Other specified cardiac arrhythmias: Secondary | ICD-10-CM

## 2014-02-06 DIAGNOSIS — K4090 Unilateral inguinal hernia, without obstruction or gangrene, not specified as recurrent: Secondary | ICD-10-CM

## 2014-02-06 DIAGNOSIS — R0600 Dyspnea, unspecified: Secondary | ICD-10-CM

## 2014-02-06 DIAGNOSIS — R0609 Other forms of dyspnea: Secondary | ICD-10-CM

## 2014-02-06 MED ORDER — ROSUVASTATIN CALCIUM 40 MG PO TABS: 40.0000 mg | ORAL_TABLET | ORAL | Status: DC

## 2014-02-06 MED ORDER — TICAGRELOR 90 MG PO TABS: 90.0000 mg | ORAL_TABLET | Freq: Two times a day (BID) | ORAL | Status: DC

## 2014-02-06 MED ORDER — NITROGLYCERIN 0.4 MG SL SUBL: 0.4000 mg | SUBLINGUAL_TABLET | SUBLINGUAL | Status: DC | PRN

## 2014-02-06 NOTE — Assessment & Plan Note (Signed)
Stable with no angina and good activity level.  Continue medical Rx Some exertional fatigue and dyspnea will order ETT to make sure non ischemic response And no chronotropic incompetence Has had symptomatic bradycardia on beta blockers before

## 2014-02-06 NOTE — Progress Notes (Signed)
Patient ID: Jose Duarte, male   DOB: April 24, 1941, 73 y.o.   MRN: [id number] 73 yo self referral. Previously seen by [name]. Did not like [facility] practice. Wanted to change to [place]. On 03/01/13 He was taken acutely to the cath lab which revealed a totally occluded ramus branch. This was stented with a 3.0x16 mm Promus Premier DES stent postdilated 3.2 mm with 100% occlusion reduced to 0% and resumption of TIMI 3 flow. The patient recovered and progressed very well. He was started on brilinta. Lisinopril was increased due to hypertension. HR apparently dropped into the 30's for a short period of time during sheath pull. Likely a vagal response. Beta blocker was not added at that time however, the following day he was started on a low dose of lopressor which appeared to reduce the episodes of NSVT. He ambulated well with cardiac rehab but did experience some SOB which appears to have improved. 2D echo revealed an EF of 45-50%(see below).   Echo 04/20/13  Study Conclusions  - Left ventricle: Difficult acoustic windows limit study. LVEF is approximately 35% with diffuse hypokinesis, worse in the inferior and lateral walls. The cavity size was normal. Wall thickness was normal. Doppler parameters are consistent with abnormal left ventricular relaxation (grade 1 diastolic dysfunction). - Mitral valve: Mild regurgitation.  Had ETT with Dr Caryl Comes and no chronotropic incompetence beta blockers stopped  MRI: 04/25/13 EF 47%  IMPRESSION: 1) Mild LVE and mild LVH EF 47%  2) Nearly full thickness scar involving the lateral and inferolateral walls    Had left inguinal surgery before MI and needs redo  Some exertional dyspnea and fatigue with history of slow HR  Compliant with meds Needs nitro     ROS: Denies fever, malais, weight loss, blurry vision, decreased visual acuity, cough, sputum, SOB, hemoptysis, pleuritic pain, palpitaitons, heartburn, abdominal pain, melena, lower extremity edema,  claudication, or rash.  All other systems reviewed and negative  General: Affect appropriate Healthy:  appears stated age 45: normal Neck supple with no adenopathy JVP normal no bruits no thyromegaly Lungs clear with no wheezing and good diaphragmatic motion Heart:  S1/S2 no murmur, no rub, gallop or click PMI normal Abdomen: benighn, BS positve, no tenderness, no AAA no bruit.  No HSM or HJR Distal pulses intact with no bruits No edema Neuro non-focal Skin warm and dry No muscular weakness   Current Outpatient Prescriptions  Medication Sig Dispense Refill  . aspirin 81 MG tablet Take 81 mg by mouth daily.      Marland Kitchen losartan (COZAAR) 25 MG tablet Take 1 tablet (25 mg total) by mouth daily.  90 tablet  3  . nitroGLYCERIN (NITROSTAT) 0.4 MG SL tablet Place 1 tablet (0.4 mg total) under the tongue every 5 (five) minutes x 3 doses as needed for chest pain.  25 tablet  12  . ranitidine (ZANTAC) 150 MG tablet Take 150 mg by mouth as needed.       . rosuvastatin (CRESTOR) 40 MG tablet Take 1 tablet (40 mg total) by mouth daily.  90 tablet  3  . Ticagrelor (BRILINTA) 90 MG TABS tablet Take 1 tablet (90 mg total) by mouth 2 (two) times daily.  60 tablet  11   No current facility-administered medications for this visit.    Allergies  Penicillins  Electrocardiogram:  SR rate 51 PVC LAD   Assessment and Plan

## 2014-02-06 NOTE — Assessment & Plan Note (Signed)
Well controlled.  Continue current medications and low sodium Dash type diet.    

## 2014-02-06 NOTE — Patient Instructions (Addendum)
Your physician has recommended you make the following change in your medication:   1. Stop Brilinta at the end of September.   2. Start taking Crestor 40 mg every other day.  Your physician has requested that you have an exercise tolerance test. For further information please visit [email or web address]. Please also follow instruction sheet, as given.  Your physician wants you to follow-up in: 6 months with Dr. Johnsie Cancel. You will receive a reminder letter in the mail two months in advance. If you don't receive a letter, please call our office to schedule the follow-up appointment.

## 2014-02-06 NOTE — Assessment & Plan Note (Signed)
Discussed low carb diet.  Target hemoglobin A1c is 6.5 or less.  Continue current medications.  

## 2014-02-06 NOTE — Assessment & Plan Note (Signed)
Needs redo by Dr Linus Salmons to proceed in October if ETT ok  He will be off Brillinta in September which will mark full year of DAT

## 2014-02-20 ENCOUNTER — Encounter: Payer: Self-pay | Admitting: Physician Assistant

## 2014-03-06 ENCOUNTER — Ambulatory Visit (INDEPENDENT_AMBULATORY_CARE_PROVIDER_SITE_OTHER): Payer: Managed Care, Other (non HMO) | Admitting: Nurse Practitioner

## 2014-03-06 ENCOUNTER — Encounter: Payer: Self-pay | Admitting: Nurse Practitioner

## 2014-03-06 VITALS — BP 147/84 | HR 57

## 2014-03-06 DIAGNOSIS — I498 Other specified cardiac arrhythmias: Secondary | ICD-10-CM

## 2014-03-06 DIAGNOSIS — R001 Bradycardia, unspecified: Secondary | ICD-10-CM

## 2014-03-06 DIAGNOSIS — R0609 Other forms of dyspnea: Secondary | ICD-10-CM

## 2014-03-06 DIAGNOSIS — R0989 Other specified symptoms and signs involving the circulatory and respiratory systems: Secondary | ICD-10-CM

## 2014-03-06 DIAGNOSIS — R0600 Dyspnea, unspecified: Secondary | ICD-10-CM

## 2014-03-06 MED ORDER — LOSARTAN POTASSIUM 50 MG PO TABS: 50.0000 mg | ORAL_TABLET | Freq: Every day | ORAL | Status: DC

## 2014-03-06 NOTE — Patient Instructions (Addendum)
Increase your Losartan to 50 mg a day - I have sent this to your drug store - you can take 2 of your 25 mg tablets to use those up  Monitor your blood pressure at home - keep a diary - bring to your next visit  See Dr. Johnsie Cancel in 6 weeks with lab - do not need fast  Ok to schedule your hernia surgery -  We feel that you are at low risk for this procedure  Call the [place] office at [phone] if you have any questions, problems or concerns.

## 2014-03-06 NOTE — Progress Notes (Signed)
Exercise Treadmill Test  Pre-Exercise Testing Evaluation Rhythm: normal sinus  Rate: 54 bpm     Test  Exercise Tolerance Test Ordering MD: Jenkins Rouge, MD  Interpreting MD: Truitt Merle, NP  Unique Test No: 1  Treadmill:  1  Indication for ETT: exertional dyspnea  Contraindication to ETT: No   Stress Modality: exercise - treadmill  Cardiac Imaging Performed: non   Protocol: standard Bruce - maximal  Max BP:  242/106  Max MPHR (bpm):  149 85% MPR (bpm):  127  MPHR obtained (bpm):  110 % MPHR obtained:  73%  Reached 85% MPHR (min:sec):  NA Total Exercise Time (min-sec):  9 minutes  Workload in METS:  10.1 Borg Scale: 14  Reason ETT Terminated:  exaggerated hypertensive response    ST Segment Analysis At Rest: normal ST segments - no evidence of significant ST depression With Exercise: no evidence of significant ST depression  Other Information Arrhythmia:  No Angina during ETT:  absent (0) Quality of ETT:  non-diagnostic  ETT Interpretation:  normal - no evidence of ischemia by ST analysis but target HR not achieved.   Comments: Patient presents today for routine GXT. Has known CAD with prior PCI - EF of 35% per echo from October of 2014. Cardiac MRI with EF of 47% in December of 2014. He complains of DOE. Needs pre op clearance for redo hernia surgery.  Resting EKG with sinus brady. No medicines held today.   Today the patient exercised on the standard Bruce protocol for a total of 9 minutes.  Good exercise tolerance.  Hypertensive blood pressure response.  Clinically negative for chest pain. Test was stopped due to BP response.   EKG negative for ischemia. No significant arrhythmia noted except for rare PVC. Oxygen saturation did not fall below 92%.   Recommendations: Study reviewed with Dr. Acie Fredrickson (DOD) - felt to be low risk for redo hernia surgery. Needs BP treated - will increase Losartan to 50 mg a day - have asked him to monitor BP at home as well.  See back in 6  weeks with BMET.  Patient is agreeable to this plan and will call if any problems develop in the interim.   Burtis Junes, RN, [place] [address] [place], [place]  [address] [phone]

## 2014-03-18 ENCOUNTER — Other Ambulatory Visit: Payer: Self-pay

## 2014-03-18 MED ORDER — ROSUVASTATIN CALCIUM 40 MG PO TABS: 40.0000 mg | ORAL_TABLET | ORAL | Status: DC

## 2014-04-17 ENCOUNTER — Encounter: Payer: Self-pay | Admitting: Cardiovascular Disease

## 2014-04-17 ENCOUNTER — Ambulatory Visit (INDEPENDENT_AMBULATORY_CARE_PROVIDER_SITE_OTHER): Payer: Managed Care, Other (non HMO) | Admitting: Cardiovascular Disease

## 2014-04-17 VITALS — BP 140/80 | HR 62 | Ht 71.0 in | Wt 177.8 lb

## 2014-04-17 DIAGNOSIS — K439 Ventral hernia without obstruction or gangrene: Secondary | ICD-10-CM

## 2014-04-17 DIAGNOSIS — Z79899 Other long term (current) drug therapy: Secondary | ICD-10-CM

## 2014-04-17 DIAGNOSIS — R001 Bradycardia, unspecified: Secondary | ICD-10-CM

## 2014-04-17 DIAGNOSIS — E785 Hyperlipidemia, unspecified: Secondary | ICD-10-CM

## 2014-04-17 LAB — BASIC METABOLIC PANEL
BUN: 17 mg/dL (ref 6–23)
CALCIUM: 9.5 mg/dL (ref 8.4–10.5)
CO2: 23 mEq/L (ref 19–32)
Chloride: 105 mEq/L (ref 96–112)
Creatinine, Ser: 0.9 mg/dL (ref 0.4–1.5)
GFR: 85.93 mL/min (ref >60.00)
Glucose, Bld: 115 mg/dL — ABNORMAL HIGH (ref 70–99)
Potassium: 4.3 mEq/L (ref 3.5–5.1)
Sodium: 138 mEq/L (ref 135–145)

## 2014-04-17 MED ORDER — DILTIAZEM HCL ER COATED BEADS 120 MG PO CP24: 120.0000 mg | ORAL_CAPSULE | Freq: Every day | ORAL | Status: DC

## 2014-04-17 NOTE — Assessment & Plan Note (Signed)
Stable with no angina and good activity level.  Continue medical Rx Continue Brillinta

## 2014-04-17 NOTE — Progress Notes (Signed)
Patient ID: Jose Duarte, male   DOB: April 02, 1941, 73 y.o.   MRN: [id number] 73 yo self referral. Previously seen by [name]. Did not like [facility] practice. Wanted to change to [place]. On 03/01/13 He was taken acutely to the cath lab which revealed a totally occluded ramus branch. This was stented with a 3.0x16 mm Promus Premier DES stent postdilated 3.2 mm with 100% occlusion reduced to 0% and resumption of TIMI 3 flow. The patient recovered and progressed very well. He was started on brilinta. Lisinopril was increased due to hypertension. HR apparently dropped into the 30's for a short period of time during sheath pull. Likely a vagal response. Beta blocker was not added at that time however, the following day he was started on a low dose of lopressor which appeared to reduce the episodes of NSVT. He ambulated well with cardiac rehab but did experience some SOB which appears to have improved. 2D echo revealed an EF of 45-50%(see below).  Echo 04/20/13  Study Conclusions  - Left ventricle: Difficult acoustic windows limit study. LVEF is approximately 35% with diffuse hypokinesis, worse in the inferior and lateral walls. The cavity size was normal. Wall thickness was normal. Doppler parameters are consistent with abnormal left ventricular relaxation (grade 1 diastolic dysfunction). - Mitral valve: Mild regurgitation.  Had ETT with Dr Caryl Comes and no chronotropic incompetence beta blockers stopped  MRI: 04/25/13 EF 47%  IMPRESSION: 1) Mild LVE and mild LVH EF 47%  2) Nearly full thickness scar involving the lateral and inferolateral walls  Had left inguinal surgery before MI and needs redo Some exertional dyspnea and fatigue with history of slow HR Compliant with meds Needs nitro    9/16  ETT negative but HTN response  Losartan increased  Review of BP readings shows high in am and ok during day.  Has URI with some chest  Congestion no real chest pain has not needed nitro    ROS: Denies fever,  malais, weight loss, blurry vision, decreased visual acuity, cough, sputum, SOB, hemoptysis, pleuritic pain, palpitaitons, heartburn, abdominal pain, melena, lower extremity edema, claudication, or rash.  All other systems reviewed and negative  General: Affect appropriate Healthy:  appears stated age 55: normal Neck supple with no adenopathy JVP normal no bruits no thyromegaly Lungs clear with no wheezing and good diaphragmatic motion Heart:  S1/S2 no murmur, no rub, gallop or click PMI normal Abdomen: benighn, BS positve, no tenderness, no AAA no bruit.  No HSM or HJR Distal pulses intact with no bruits No edema Neuro non-focal Skin warm and dry No muscular weakness   Current Outpatient Prescriptions  Medication Sig Dispense Refill  . aspirin 81 MG tablet Take 81 mg by mouth daily.      Marland Kitchen losartan (COZAAR) 50 MG tablet Take 1 tablet (50 mg total) by mouth daily.  90 tablet  3  . nitroGLYCERIN (NITROSTAT) 0.4 MG SL tablet Place 1 tablet (0.4 mg total) under the tongue every 5 (five) minutes x 3 doses as needed for chest pain.  25 tablet  5  . ranitidine (ZANTAC) 150 MG tablet Take 150 mg by mouth as needed.       . rosuvastatin (CRESTOR) 40 MG tablet Take 1 tablet (40 mg total) by mouth every other day.  90 tablet  3  . ticagrelor (BRILINTA) 90 MG TABS tablet Take 1 tablet (90 mg total) by mouth 2 (two) times daily.  60 tablet  11   No current facility-administered medications for  this visit.    Allergies  Penicillins  Electrocardiogram:  SB rate 51 PVC    Assessment and Plan

## 2014-04-17 NOTE — Assessment & Plan Note (Signed)
Cholesterol is at goal.  Continue current dose of statin and diet Rx.  No myalgias or side effects.  F/U  LFT's in 6 months. Lab Results  Component Value Date   LDLCALC 38 03/02/2013

## 2014-04-17 NOTE — Assessment & Plan Note (Signed)
Add cardizem at night before bed  Continue ARB during day f/u next available

## 2014-04-17 NOTE — Patient Instructions (Addendum)
Your physician recommends that you schedule a follow-up appointment in: NEXT AVAILABLE WITH DR [facility]  Your physician has recommended you make the following change in your medication: START  CARDIZEM  120 MG  AT  BEDTIME [place]  AM  Your physician recommends that you return for lab work in:  [place] BMET

## 2014-04-17 NOTE — Assessment & Plan Note (Signed)
Ok to have surgery with Dr Hassell Done  Will need to stop brillinta 4 days prior Been over a year since intervention

## 2014-04-17 NOTE — Assessment & Plan Note (Signed)
Stable avoiding beta blockers  No presyncope or AV block

## 2014-05-23 ENCOUNTER — Ambulatory Visit: Payer: Self-pay | Admitting: Cardiovascular Disease

## 2014-05-30 ENCOUNTER — Encounter (HOSPITAL_COMMUNITY): Payer: Self-pay | Admitting: Cardiovascular Disease

## 2014-06-20 NOTE — Progress Notes (Signed)
Please put orders in Epic surgery 07-03-14 pre op 06-28-14 Thanks

## 2014-06-27 NOTE — Patient Instructions (Addendum)
Jose Duarte  06/27/2014   Your procedure is scheduled on: 07/03/2014    Report to [facility]  Entrance and follow signs to               [place] at     [phone].  Call this number if you have problems the morning of surgery [phone]   Remember:  Do not eat food or drink liquids :After Midnight.     Take these medicines the morning of surgery with A SIP OF WATER: , Zantac if needed                                You may not have any metal on your body including hair pins and              piercings  Do not wear jewelry,  lotions, powders or perfumes., deodorant.               .               Men may shave face and neck.   Do not bring valuables to the hospital. [place].  Contacts, dentures or bridgework may not be worn into surgery.  Leave suitcase in the car. After surgery it may be brought to your room.         Special Instructions:coughing and deep breathing exercises, leg exercises               Please read over the following fact sheets you were given: _____________________________________________________________________             [facility] - Preparing for Surgery Before surgery, you can play an important role.  Because skin is not sterile, your skin needs to be as free of germs as possible.  You can reduce the number of germs on your skin by washing with CHG (chlorahexidine gluconate) soap before surgery.  CHG is an antiseptic cleaner which kills germs and bonds with the skin to continue killing germs even after washing. Please DO NOT use if you have an allergy to CHG or antibacterial soaps.  If your skin becomes reddened/irritated stop using the CHG and inform your nurse when you arrive at Short Stay. Do not shave (including legs and underarms) for at least 48 hours prior to the first CHG shower.  You may shave your face/neck. Please follow these instructions carefully:  1.   Shower with CHG Soap the night before surgery and the  morning of Surgery.  2.  If you choose to wash your hair, wash your hair first as usual with your  normal  shampoo.  3.  After you shampoo, rinse your hair and body thoroughly to remove the  shampoo.                           4.  Use CHG as you would any other liquid soap.  You can apply chg directly  to the skin and wash                       Gently with a scrungie or clean washcloth.  5.  Apply the CHG Soap to your  body ONLY FROM THE NECK DOWN.   Do not use on face/ open                           Wound or open sores. Avoid contact with eyes, ears mouth and genitals (private parts).                       Wash face,  Genitals (private parts) with your normal soap.             6.  Wash thoroughly, paying special attention to the area where your surgery  will be performed.  7.  Thoroughly rinse your body with warm water from the neck down.  8.  DO NOT shower/wash with your normal soap after using and rinsing off  the CHG Soap.                9.  Pat yourself dry with a clean towel.            10.  Wear clean pajamas.            11.  Place clean sheets on your bed the night of your first shower and do not  sleep with pets. Day of Surgery : Do not apply any lotions/deodorants the morning of surgery.  Please wear clean clothes to the hospital/surgery center.  FAILURE TO FOLLOW THESE INSTRUCTIONS MAY RESULT IN THE CANCELLATION OF YOUR SURGERY PATIENT SIGNATURE_________________________________  NURSE SIGNATURE__________________________________  ________________________________________________________________________

## 2014-06-27 NOTE — Progress Notes (Signed)
Need orders in EPIC.  Preop on 06/28/2014.  Surgery on 07/03/2014.  Thank You.

## 2014-06-28 ENCOUNTER — Encounter (HOSPITAL_COMMUNITY)
Admission: RE | Admit: 2014-06-28 | Discharge: 2014-06-28 | Disposition: A | Discharge status: Home / Self Care | Payer: Medicare HMO | Source: Ambulatory Visit | Attending: Surgery | Admitting: Surgery

## 2014-06-28 ENCOUNTER — Encounter (HOSPITAL_COMMUNITY): Payer: Self-pay

## 2014-06-28 ENCOUNTER — Ambulatory Visit (HOSPITAL_COMMUNITY)
Admission: RE | Admit: 2014-06-28 | Discharge: 2014-06-28 | Disposition: A | Discharge status: Home / Self Care | Payer: Medicare HMO | Source: Ambulatory Visit | Attending: Surgery | Admitting: Surgery

## 2014-06-28 DIAGNOSIS — Z01818 Encounter for other preprocedural examination: Secondary | ICD-10-CM

## 2014-06-28 DIAGNOSIS — Z955 Presence of coronary angioplasty implant and graft: Secondary | ICD-10-CM | POA: Insufficient documentation

## 2014-06-28 DIAGNOSIS — K4090 Unilateral inguinal hernia, without obstruction or gangrene, not specified as recurrent: Secondary | ICD-10-CM | POA: Insufficient documentation

## 2014-06-28 HISTORY — DX: Nocturia: R35.1

## 2014-06-28 HISTORY — DX: Family history of other specified conditions: Z84.89

## 2014-06-28 LAB — CBC
HEMATOCRIT: 46.3 % (ref 39.0–52.0)
Hemoglobin: 14.8 g/dL (ref 13.0–17.0)
MCH: 29.7 pg (ref 26.0–34.0)
MCHC: 32 g/dL (ref 30.0–36.0)
MCV: 92.8 fL (ref 78.0–100.0)
PLATELETS: 211 10*3/uL (ref 150–400)
RBC: 4.99 MIL/uL (ref 4.22–5.81)
RDW: 13.4 % (ref 11.5–15.5)
WBC: 4 10*3/uL (ref 4.0–10.5)

## 2014-06-28 LAB — BASIC METABOLIC PANEL
ANION GAP: 4 — AB (ref 5–15)
BUN: 20 mg/dL (ref 6–23)
CALCIUM: 8.9 mg/dL (ref 8.4–10.5)
CHLORIDE: 99 meq/L (ref 96–112)
CO2: 26 mmol/L (ref 19–32)
CREATININE: 0.81 mg/dL (ref 0.50–1.35)
GFR calc Af Amer: >90 mL/min (ref >90)
GFR calc non Af Amer: 87 mL/min — ABNORMAL LOW (ref >90)
GLUCOSE: 106 mg/dL — AB (ref 70–99)
Potassium: 4.6 mmol/L (ref 3.5–5.1)
Sodium: 129 mmol/L — ABNORMAL LOW (ref 135–145)

## 2014-06-28 NOTE — Progress Notes (Signed)
BMP results done 06/28/2014 faxed via EPIC to Dr Hassell Done.

## 2014-06-28 NOTE — Progress Notes (Signed)
EKG- 02/06/2014 EPIC  Stress Test 02/24/14 - EPIC  Dr Johnsie Cancel - 04/17/14- LOV EPIC - ok for surgery per note.  ECHO- 04/20/13- EPIC  Cardiac MR- 12/14- EPIC

## 2014-07-02 ENCOUNTER — Ambulatory Visit (INDEPENDENT_AMBULATORY_CARE_PROVIDER_SITE_OTHER): Payer: Self-pay | Admitting: General Surgery

## 2014-07-02 NOTE — Progress Notes (Signed)
Text message sent to Dr. Hassell Done to request orders for this patient

## 2014-07-03 ENCOUNTER — Encounter (HOSPITAL_COMMUNITY)
Admission: RE | Disposition: A | Discharge status: Home / Self Care | Payer: Self-pay | Source: Ambulatory Visit | Attending: Surgery

## 2014-07-03 ENCOUNTER — Ambulatory Visit (HOSPITAL_COMMUNITY): Payer: Medicare HMO | Admitting: Anesthesiology

## 2014-07-03 ENCOUNTER — Encounter (HOSPITAL_COMMUNITY): Payer: Self-pay | Admitting: *Deleted

## 2014-07-03 ENCOUNTER — Ambulatory Visit (HOSPITAL_COMMUNITY)
Admission: RE | Admit: 2014-07-03 | Discharge: 2014-07-04 | Disposition: A | Discharge status: Home / Self Care | Payer: Medicare HMO | Source: Ambulatory Visit | Attending: Surgery | Admitting: Surgery

## 2014-07-03 DIAGNOSIS — E119 Type 2 diabetes mellitus without complications: Secondary | ICD-10-CM | POA: Diagnosis not present

## 2014-07-03 DIAGNOSIS — M1990 Unspecified osteoarthritis, unspecified site: Secondary | ICD-10-CM | POA: Diagnosis not present

## 2014-07-03 DIAGNOSIS — I255 Ischemic cardiomyopathy: Secondary | ICD-10-CM | POA: Diagnosis not present

## 2014-07-03 DIAGNOSIS — G4730 Sleep apnea, unspecified: Secondary | ICD-10-CM | POA: Insufficient documentation

## 2014-07-03 DIAGNOSIS — I252 Old myocardial infarction: Secondary | ICD-10-CM | POA: Insufficient documentation

## 2014-07-03 DIAGNOSIS — K4091 Unilateral inguinal hernia, without obstruction or gangrene, recurrent: Secondary | ICD-10-CM | POA: Diagnosis not present

## 2014-07-03 DIAGNOSIS — K219 Gastro-esophageal reflux disease without esophagitis: Secondary | ICD-10-CM | POA: Diagnosis not present

## 2014-07-03 DIAGNOSIS — I10 Essential (primary) hypertension: Secondary | ICD-10-CM | POA: Diagnosis not present

## 2014-07-03 HISTORY — PX: INGUINAL HERNIA REPAIR: SHX194

## 2014-07-03 LAB — CBC
HEMATOCRIT: 42 % (ref 39.0–52.0)
HEMOGLOBIN: 14 g/dL (ref 13.0–17.0)
MCH: 30.6 pg (ref 26.0–34.0)
MCHC: 33.3 g/dL (ref 30.0–36.0)
MCV: 91.9 fL (ref 78.0–100.0)
Platelets: 171 10*3/uL (ref 150–400)
RBC: 4.57 MIL/uL (ref 4.22–5.81)
RDW: 13.4 % (ref 11.5–15.5)
WBC: 6.3 10*3/uL (ref 4.0–10.5)

## 2014-07-03 LAB — GLUCOSE, CAPILLARY: Glucose-Capillary: 126 mg/dL — ABNORMAL HIGH (ref 70–99)

## 2014-07-03 LAB — CREATININE, SERUM
CREATININE: 0.84 mg/dL (ref 0.50–1.35)
GFR calc non Af Amer: 85 mL/min — ABNORMAL LOW (ref >90)

## 2014-07-03 SURGERY — REPAIR, HERNIA, INGUINAL, ADULT
Anesthesia: General | Laterality: Left

## 2014-07-03 MED ORDER — CIPROFLOXACIN IN D5W 400 MG/200ML IV SOLN
400.0000 mg | INTRAVENOUS | Status: AC
  Administered 2014-07-03: 400 mg via INTRAVENOUS

## 2014-07-03 MED ORDER — FENTANYL CITRATE 0.05 MG/ML IJ SOLN
25.0000 ug | INTRAMUSCULAR | Status: DC | PRN
  Administered 2014-07-03: 25 ug via INTRAVENOUS
  Administered 2014-07-03: 50 ug via INTRAVENOUS
  Administered 2014-07-03: 25 ug via INTRAVENOUS

## 2014-07-03 MED ORDER — ONDANSETRON HCL 4 MG/2ML IJ SOLN: 4.0000 mg | Freq: Four times a day (QID) | INTRAMUSCULAR | Status: DC | PRN

## 2014-07-03 MED ORDER — DEXAMETHASONE SODIUM PHOSPHATE 10 MG/ML IJ SOLN
INTRAMUSCULAR | Status: AC
  Filled 2014-07-03: qty 1

## 2014-07-03 MED ORDER — BUPIVACAINE LIPOSOME 1.3 % IJ SUSP
20.0000 mL | Freq: Once | INTRAMUSCULAR | Status: DC
  Filled 2014-07-03: qty 20

## 2014-07-03 MED ORDER — CIPROFLOXACIN IN D5W 400 MG/200ML IV SOLN
INTRAVENOUS | Status: AC
  Filled 2014-07-03: qty 200

## 2014-07-03 MED ORDER — HYDROCODONE-ACETAMINOPHEN 5-325 MG PO TABS
1.0000 | ORAL_TABLET | ORAL | Status: DC | PRN
  Administered 2014-07-03 – 2014-07-04 (×3): 1 via ORAL
  Filled 2014-07-03 (×2): qty 1
  Filled 2014-07-03: qty 2

## 2014-07-03 MED ORDER — MORPHINE SULFATE 2 MG/ML IJ SOLN
1.0000 mg | INTRAMUSCULAR | Status: DC | PRN
  Administered 2014-07-03: 1 mg via INTRAVENOUS
  Filled 2014-07-03: qty 1

## 2014-07-03 MED ORDER — HEPARIN SODIUM (PORCINE) 5000 UNIT/ML IJ SOLN
5000.0000 [IU] | Freq: Once | INTRAMUSCULAR | Status: AC
  Administered 2014-07-03: 5000 [IU] via SUBCUTANEOUS
  Filled 2014-07-03: qty 1

## 2014-07-03 MED ORDER — NEOSTIGMINE METHYLSULFATE 10 MG/10ML IV SOLN
INTRAVENOUS | Status: DC | PRN
  Administered 2014-07-03: 4 mg via INTRAVENOUS

## 2014-07-03 MED ORDER — NITROGLYCERIN 0.4 MG SL SUBL: 0.4000 mg | SUBLINGUAL_TABLET | SUBLINGUAL | Status: DC | PRN

## 2014-07-03 MED ORDER — DILTIAZEM HCL ER COATED BEADS 120 MG PO CP24
120.0000 mg | ORAL_CAPSULE | Freq: Every day | ORAL | Status: DC
  Administered 2014-07-03: 120 mg via ORAL
  Filled 2014-07-03 (×2): qty 1

## 2014-07-03 MED ORDER — BUPIVACAINE-EPINEPHRINE 0.25% -1:200000 IJ SOLN
INTRAMUSCULAR | Status: DC | PRN
  Administered 2014-07-03: 20 mL

## 2014-07-03 MED ORDER — EPHEDRINE SULFATE 50 MG/ML IJ SOLN
INTRAMUSCULAR | Status: DC | PRN
  Administered 2014-07-03 (×2): 10 mg via INTRAVENOUS

## 2014-07-03 MED ORDER — LACTATED RINGERS IV SOLN
INTRAVENOUS | Status: DC
  Administered 2014-07-03: 18:00:00 via INTRAVENOUS

## 2014-07-03 MED ORDER — CISATRACURIUM BESYLATE 20 MG/10ML IV SOLN
INTRAVENOUS | Status: AC
  Filled 2014-07-03: qty 10

## 2014-07-03 MED ORDER — CHLORHEXIDINE GLUCONATE 4 % EX LIQD: 1.0000 "application " | Freq: Once | CUTANEOUS | Status: DC

## 2014-07-03 MED ORDER — SODIUM CHLORIDE 0.9 % IJ SOLN
INTRAMUSCULAR | Status: AC
  Filled 2014-07-03: qty 10

## 2014-07-03 MED ORDER — DEXAMETHASONE SODIUM PHOSPHATE 10 MG/ML IJ SOLN
INTRAMUSCULAR | Status: DC | PRN
  Administered 2014-07-03: 10 mg via INTRAVENOUS

## 2014-07-03 MED ORDER — ONDANSETRON HCL 4 MG/2ML IJ SOLN
INTRAMUSCULAR | Status: AC
  Filled 2014-07-03: qty 2

## 2014-07-03 MED ORDER — EPHEDRINE SULFATE 50 MG/ML IJ SOLN
INTRAMUSCULAR | Status: AC
  Filled 2014-07-03: qty 1

## 2014-07-03 MED ORDER — PROPOFOL 10 MG/ML IV BOLUS
INTRAVENOUS | Status: DC | PRN
  Administered 2014-07-03: 100 mg via INTRAVENOUS
  Administered 2014-07-03: 50 mg via INTRAVENOUS

## 2014-07-03 MED ORDER — GLYCOPYRROLATE 0.2 MG/ML IJ SOLN
INTRAMUSCULAR | Status: DC | PRN
  Administered 2014-07-03: 0.6 mg via INTRAVENOUS

## 2014-07-03 MED ORDER — ACETAMINOPHEN 10 MG/ML IV SOLN
1000.0000 mg | Freq: Once | INTRAVENOUS | Status: AC
  Administered 2014-07-03: 1000 mg via INTRAVENOUS
  Filled 2014-07-03: qty 100

## 2014-07-03 MED ORDER — FENTANYL CITRATE 0.05 MG/ML IJ SOLN
INTRAMUSCULAR | Status: DC | PRN
  Administered 2014-07-03 (×2): 50 ug via INTRAVENOUS

## 2014-07-03 MED ORDER — CISATRACURIUM BESYLATE (PF) 10 MG/5ML IV SOLN
INTRAVENOUS | Status: DC | PRN
  Administered 2014-07-03: 4 mg via INTRAVENOUS

## 2014-07-03 MED ORDER — ONDANSETRON HCL 4 MG/2ML IJ SOLN
INTRAMUSCULAR | Status: DC | PRN
  Administered 2014-07-03: 4 mg via INTRAVENOUS

## 2014-07-03 MED ORDER — GLYCOPYRROLATE 0.2 MG/ML IJ SOLN
INTRAMUSCULAR | Status: AC
  Filled 2014-07-03: qty 3

## 2014-07-03 MED ORDER — NEOSTIGMINE METHYLSULFATE 10 MG/10ML IV SOLN
INTRAVENOUS | Status: AC
  Filled 2014-07-03: qty 1

## 2014-07-03 MED ORDER — SUCCINYLCHOLINE CHLORIDE 20 MG/ML IJ SOLN
INTRAMUSCULAR | Status: DC | PRN
  Administered 2014-07-03: 100 mg via INTRAVENOUS

## 2014-07-03 MED ORDER — LOSARTAN POTASSIUM 50 MG PO TABS
50.0000 mg | ORAL_TABLET | Freq: Every day | ORAL | Status: DC
  Administered 2014-07-04: 50 mg via ORAL
  Filled 2014-07-03 (×2): qty 1

## 2014-07-03 MED ORDER — HEPARIN SODIUM (PORCINE) 5000 UNIT/ML IJ SOLN
5000.0000 [IU] | Freq: Three times a day (TID) | INTRAMUSCULAR | Status: DC
  Administered 2014-07-03 – 2014-07-04 (×2): 5000 [IU] via SUBCUTANEOUS
  Filled 2014-07-03 (×5): qty 1

## 2014-07-03 MED ORDER — ONDANSETRON HCL 4 MG PO TABS: 4.0000 mg | ORAL_TABLET | Freq: Four times a day (QID) | ORAL | Status: DC | PRN

## 2014-07-03 MED ORDER — FENTANYL CITRATE 0.05 MG/ML IJ SOLN
INTRAMUSCULAR | Status: AC
  Filled 2014-07-03: qty 2

## 2014-07-03 MED ORDER — LACTATED RINGERS IV SOLN
INTRAVENOUS | Status: DC | PRN
  Administered 2014-07-03: 08:00:00 via INTRAVENOUS

## 2014-07-03 MED ORDER — PROPOFOL 10 MG/ML IV BOLUS
INTRAVENOUS | Status: AC
  Filled 2014-07-03: qty 20

## 2014-07-03 MED ORDER — SODIUM CHLORIDE 0.9 % IJ SOLN
INTRAMUSCULAR | Status: AC
  Filled 2014-07-03: qty 20

## 2014-07-03 MED ORDER — BUPIVACAINE-EPINEPHRINE 0.25% -1:200000 IJ SOLN
INTRAMUSCULAR | Status: AC
  Filled 2014-07-03: qty 1

## 2014-07-03 MED ORDER — PROMETHAZINE HCL 25 MG/ML IJ SOLN: 6.2500 mg | INTRAMUSCULAR | Status: DC | PRN

## 2014-07-03 MED ORDER — RANITIDINE HCL 150 MG/10ML PO SYRP
150.0000 mg | ORAL_SOLUTION | Freq: Once | ORAL | Status: AC
  Administered 2014-07-03: 150 mg via ORAL
  Filled 2014-07-03: qty 10

## 2014-07-03 SURGICAL SUPPLY — 34 items
APL SKNCLS STERI-STRIP NONHPOA (GAUZE/BANDAGES/DRESSINGS)
BENZOIN TINCTURE PRP APPL 2/3 (GAUZE/BANDAGES/DRESSINGS) IMPLANT
BLADE HEX COATED 2.75 (ELECTRODE) ×3 IMPLANT
BLADE SURG 15 STRL LF DISP TIS (BLADE) ×1 IMPLANT
BLADE SURG 15 STRL SS (BLADE) ×3
CLOSURE WOUND 1/2 X4 (GAUZE/BANDAGES/DRESSINGS)
DECANTER SPIKE VIAL GLASS SM (MISCELLANEOUS) ×3 IMPLANT
DISSECTOR ROUND CHERRY 3/8 STR (MISCELLANEOUS) IMPLANT
DRAIN PENROSE 18X1/2 LTX STRL (DRAIN) ×3 IMPLANT
DRAPE LAPAROTOMY TRNSV 102X78 (DRAPE) ×3 IMPLANT
ELECT REM PT RETURN 9FT ADLT (ELECTROSURGICAL) ×3
ELECTRODE REM PT RTRN 9FT ADLT (ELECTROSURGICAL) ×1 IMPLANT
GLOVE BIOGEL M 8.0 STRL (GLOVE) ×3 IMPLANT
GOWN STRL REUS W/TWL XL LVL3 (GOWN DISPOSABLE) ×6 IMPLANT
KIT BASIN OR (CUSTOM PROCEDURE TRAY) ×3 IMPLANT
LIQUID BAND (GAUZE/BANDAGES/DRESSINGS) ×3 IMPLANT
MESH HERNIA 3X6 (Mesh General) ×3 IMPLANT
NEEDLE HYPO 22GX1.5 SAFETY (NEEDLE) ×3 IMPLANT
PACK BASIC VI WITH GOWN DISP (CUSTOM PROCEDURE TRAY) ×3 IMPLANT
PENCIL BUTTON HOLSTER BLD 10FT (ELECTRODE) ×3 IMPLANT
SPONGE LAP 4X18 X RAY DECT (DISPOSABLE) ×3 IMPLANT
STAPLER VISISTAT 35W (STAPLE) IMPLANT
STRIP CLOSURE SKIN 1/2X4 (GAUZE/BANDAGES/DRESSINGS) IMPLANT
SUT PROLENE 2 0 CT2 30 (SUTURE) ×9 IMPLANT
SUT SILK 2 0 SH (SUTURE) IMPLANT
SUT VIC AB 2-0 SH 27 (SUTURE) ×3
SUT VIC AB 2-0 SH 27X BRD (SUTURE) ×1 IMPLANT
SUT VIC AB 4-0 SH 18 (SUTURE) ×3 IMPLANT
SUT VICRYL 4-0 (SUTURE) ×3 IMPLANT
SYR 20CC LL (SYRINGE) ×3 IMPLANT
SYR BULB IRRIGATION 50ML (SYRINGE) ×3 IMPLANT
TOWEL OR 17X26 10 PK STRL BLUE (TOWEL DISPOSABLE) ×3 IMPLANT
TOWEL OR NON WOVEN STRL DISP B (DISPOSABLE) ×3 IMPLANT
YANKAUER SUCT BULB TIP 10FT TU (MISCELLANEOUS) ×3 IMPLANT

## 2014-07-03 NOTE — Op Note (Signed)
Surgeon: Kaylyn Lim, MD, FACS  Asst:  none  Anes:  General endotracheal  Procedure: Open left inguinal hernia  Diagnosis: Recurrent left direct inguinal hernia  Complications: none  EBL:   minimal cc  Drains: none  Description of Procedure:  The patient was taken to OR 1 at [facility].  After anesthesia was administered and the patient was prepped a timeout was performed.  An oblique incision was made and carried down to the external oblique fibers.  The cord was mobilized and a penrose placed about it.  A white hernia sac was immediately obvious and once dissected from the cord, it appeared to be a moderately large direct hernia.  The cord was otherwise skeletonized and no indirect hernia was seen.  The direct defect was manually reduced and closed with 4 sutures of 2-0 prolene.  The floor was then repaired with marlex type mesh suturing it in place with 2-0 prolene along the inguinal ligament and medially as well with a running suture.  The mesh was wrapped around the cord and this was sewn with a 2-0 prolene.  The external oblique was then closed with 2-0 vicryl.  4-0 vicryl was used to close in layers including subcut and then LiquiBan was applied on the skin.  The area was injected with 20 cc of marcaine.  The patient tolerated the procedure well and was taken to the PACU in stable condition.     Matt B. Hassell Done, [place], [facility], [place]

## 2014-07-03 NOTE — Transfer of Care (Signed)
Immediate Anesthesia Transfer of Care Note  Patient: Jose Duarte  Procedure(s) Performed: Procedure(s): LEFT INGUINAL HERNIA REPAIR WITH MESH (Left)  Patient Location: PACU  Anesthesia Type:General  Level of Consciousness: awake, alert , oriented and patient cooperative  Airway & Oxygen Therapy: Patient Spontanous Breathing and Patient connected to face mask oxygen  Post-op Assessment: Report given to PACU RN and Post -op Vital signs reviewed and stable  Post vital signs: Reviewed and stable  Complications: No apparent anesthesia complications

## 2014-07-03 NOTE — H&P (Signed)
Chief Complaint:  Recurrent left inguinal hernia  History of Present Illness:  Jose Duarte is an 74 y.o. male who presents for repair of a recurrent left inguinal hernia.  Prior repair was laparoscopic.  Informed consent was obtained about open [facility] repair.    Past Medical History  Diagnosis Date  . GERD (gastroesophageal reflux disease)   . Hyperlipidemia   . Stroke     TIA  . Hypertension   . Abdominal pain   . PONV (postoperative nausea and vomiting)   . ST elevation myocardial infarction (STEMI) of inferolateral wall, initial episode of care 03/01/2013    Proximal Ramus Occlusion - PCI with Promus Premier DES   . CAD S/P percutaneous coronary angioplasty - PCI RCA 03/01/2013    Promus Premier DES 3.0 mm x 16 mm - post-dilated to 3.2 mm - Dr. Claiborne Billings   . HTN (hypertension), benign 03/02/2013  . Family history of adverse reaction to anesthesia     brother has problems with nausea and vomiting   . Neuromuscular disorder     foot neuropathy   . Sleep apnea     sleep study- 20years ago - does not wear CPAP  . Shortness of breath dyspnea     shortness of breath with exertion   . Diabetes mellitus without complication     supposed to be on metformin has not taken in 2 months   . Nocturia   . Arthritis   . Cancer     hx of skin cancer on ears     Past Surgical History  Procedure Laterality Date  . Neck surgery  1960's or 70's  . Cholecystectomy  2007  . Colon surgery  2011  . Inguinal hernia repair Left 09/18/2012    Procedure: LAPAROSCOPIC INGUINAL HERNIA;  Surgeon: Ralene Ok, MD;  Location: [facility] ORS;  Service: General;  Laterality: Left;  Marland Kitchen Ventral hernia repair N/A 09/18/2012    Procedure: LAPAROSCOPIC VENTRAL HERNIA;  Surgeon: Ralene Ok, MD;  Location: [facility] ORS;  Service: General;  Laterality: N/A;  . Insertion of mesh Left 09/18/2012    Procedure: INSERTION OF MESH;  Surgeon: Ralene Ok, MD;  Location: [facility] ORS;  Service: General;  Laterality: Left;  .  Laparoscopic lysis of adhesions N/A 09/18/2012    Procedure: LAPAROSCOPIC LYSIS OF ADHESIONS;  Surgeon: Ralene Ok, MD;  Location: [facility] ORS;  Service: General;  Laterality: N/A;  . Coronary stent placement  2015  . Left heart catheterization with coronary angiogram N/A 03/01/2013    Procedure: LEFT HEART CATHETERIZATION WITH CORONARY ANGIOGRAM;  Surgeon: Troy Sine, MD;  Location: [facility] CATH LAB;  Service: Cardiovascular;  Laterality: N/A;    Current Facility-Administered Medications  Medication Dose Route Frequency Provider Last Rate Last Dose  . acetaminophen (OFIRMEV) IV 1,000 mg  1,000 mg Intravenous Once Tiajuana Amass, MD      . bupivacaine liposome (EXPAREL) 1.3 % injection 266 mg  20 mL Infiltration Once Pedro Earls, MD      . chlorhexidine (HIBICLENS) 4 % liquid 1 application  1 application Topical Once Pedro Earls, MD      . ciprofloxacin (CIPRO) IVPB 400 mg  400 mg Intravenous On Call to OR Pedro Earls, MD       Facility-Administered Medications Ordered in Other Encounters  Medication Dose Route Frequency Provider Last Rate Last Dose  . lactated ringers infusion    Continuous PRN Dione Booze, CRNA       Penicillins Family History  Problem Relation Age of Onset  . Cancer Sister     lung   Social History:   reports that he has never smoked. He has quit using smokeless tobacco. His smokeless tobacco use included Chew. He reports that he does not drink alcohol or use illicit drugs.   REVIEW OF SYSTEMS : Negative except for prior MI and stent placement  Physical Exam:   Blood pressure 150/70, pulse 56, temperature 97.4 F (36.3 C), temperature source Oral, resp. rate 18, height 5\' 11"  (1.803 m), weight 187 lb (84.823 kg), SpO2 97 %. Body mass index is 26.09 kg/(m^2).  Gen:  WDWN WM NAD  Neurological: Alert and oriented to person, place, and time. Motor and sensory function is grossly intact  Head: Normocephalic and atraumatic.  Eyes: Conjunctivae  are normal. Pupils are equal, round, and reactive to light. No scleral icterus.  Neck: Normal range of motion. Neck supple. No tracheal deviation or thyromegaly present.  Cardiovascular:  SR without murmurs or gallops.  No carotid bruits Breast:  Not examined Respiratory: Effort normal.  No respiratory distress. No chest wall tenderness. Breath sounds normal.  No wheezes, rales or rhonchi.  Abdomen:  nontender GU:  Left inguinal hernia Musculoskeletal: Normal range of motion. Extremities are nontender. No cyanosis, edema or clubbing noted Lymphadenopathy: No cervical, preauricular, postauricular or axillary adenopathy is present Skin: Skin is warm and dry. No rash noted. No diaphoresis. No erythema. No pallor. Pscyh: Normal mood and affect. Behavior is normal. Judgment and thought content normal.   LABORATORY RESULTS: Results for orders placed or performed during the hospital encounter of 07/03/14 (from the past 48 hour(s))  Glucose, capillary     Status: Abnormal   Collection Time: 07/03/14  6:31 AM  Result Value Ref Range   Glucose-Capillary 126 (H) 70 - 99 mg/dL   Comment 1 Notify RN      RADIOLOGY RESULTS: No results found.  Problem List: Patient Active Problem List   Diagnosis Date Noted  . Recurrent left inguinal hernia 12/13/2013  . TIA (transient ischemic attack) 05/08/2013  . Sinus bradycardia 05/08/2013  . 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 Ischemic cardiomyopathy 04/25/2013  . HTN (hypertension), benign 03/02/2013  . 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 Hyperlipidemia LDL goal <70 03/02/2013    Class: Chronic  . ST elevation myocardial infarction (STEMI) of inferior wall, initial episode of care - Ramus Intermedius Occlusion, PCI with DES Promus Premier 3.0 mm x 16 mm 03/01/2013    Class: Hospitalized for  . CAD S/P percutaneous coronary angioplasty - PCI Ramus 03/01/2013    Class: Acute  . Left inguinal hernia 08/07/2012  . Ventral  hernia 07/11/2012  . Abdominal pain     Assessment & Plan: Left inguinal hernia-recurrent Open left inguinal hernia repair    Matt B. Hassell Done, MD, [facility] [phone] [place] [phone]  07/03/2014 8:22 AM

## 2014-07-03 NOTE — Anesthesia Postprocedure Evaluation (Signed)
  Anesthesia Post-op Note  Patient: Jose Duarte  Procedure(s) Performed: Procedure(s): LEFT INGUINAL HERNIA REPAIR WITH MESH (Left)  Patient Location: PACU  Anesthesia Type:General  Level of Consciousness: awake and alert   Airway and Oxygen Therapy: Patient Spontanous Breathing  Post-op Pain: none  Post-op Assessment: Post-op Vital signs reviewed  Post-op Vital Signs: Reviewed  Last Vitals:  Filed Vitals:   07/03/14 1115  BP: 126/62  Pulse: 58  Temp: 36.6 C  Resp: 16    Complications: No apparent anesthesia complications

## 2014-07-03 NOTE — Interval H&P Note (Signed)
History and Physical Interval Note:  07/03/2014 8:27 AM  Jose Duarte  has presented today for surgery, with the diagnosis of left inguinal hernia  The various methods of treatment have been discussed with the patient and family. After consideration of risks, benefits and other options for treatment, the patient has consented to  Procedure(s): LEFT INGUINAL HERNIA REPAIR (Left) as a surgical intervention .  The patient's history has been reviewed, patient examined, no change in status, stable for surgery.  I have reviewed the patient's chart and labs.  Questions were answered to the patient's satisfaction.     Kyndall Amero B

## 2014-07-03 NOTE — Anesthesia Preprocedure Evaluation (Addendum)
Anesthesia Evaluation  Patient identified by MRN, date of birth, ID band Patient awake    History of Anesthesia Complications (+) PONV  Airway Mallampati: II  TM Distance: >3 FB Neck ROM: Full    Dental  (+) Teeth Intact, Dental Advisory Given   Pulmonary sleep apnea ,          Cardiovascular hypertension, + CAD, + Past MI and + Cardiac Stents  Ischemic cardiomyopathy: EF 40-45%   Neuro/Psych TIACVA negative psych ROS   GI/Hepatic Neg liver ROS, GERD-  ,  Endo/Other  diabetes, Type 2  Renal/GU negative Renal ROS     Musculoskeletal  (+) Arthritis -,   Abdominal   Peds  Hematology negative hematology ROS (+)   Anesthesia Other Findings   Reproductive/Obstetrics                            Anesthesia Physical Anesthesia Plan  ASA: III  Anesthesia Plan: General   Post-op Pain Management:    Induction: Intravenous  Airway Management Planned: LMA and Oral ETT  Additional Equipment:   Intra-op Plan:   Post-operative Plan: Extubation in OR  Informed Consent: I have reviewed the patients History and Physical, chart, labs and discussed the procedure including the risks, benefits and alternatives for the proposed anesthesia with the patient or authorized representative who has indicated his/her understanding and acceptance.   Dental advisory given  Plan Discussed with: CRNA  Anesthesia Plan Comments:         Anesthesia Quick Evaluation

## 2014-07-04 ENCOUNTER — Encounter (HOSPITAL_COMMUNITY): Payer: Self-pay | Admitting: Surgery

## 2014-07-04 DIAGNOSIS — K4091 Unilateral inguinal hernia, without obstruction or gangrene, recurrent: Secondary | ICD-10-CM | POA: Diagnosis not present

## 2014-07-04 LAB — CBC
HEMATOCRIT: 40.3 % (ref 39.0–52.0)
HEMOGLOBIN: 13.3 g/dL (ref 13.0–17.0)
MCH: 30.2 pg (ref 26.0–34.0)
MCHC: 33 g/dL (ref 30.0–36.0)
MCV: 91.6 fL (ref 78.0–100.0)
PLATELETS: 190 10*3/uL (ref 150–400)
RBC: 4.4 MIL/uL (ref 4.22–5.81)
RDW: 13.4 % (ref 11.5–15.5)
WBC: 10.2 10*3/uL (ref 4.0–10.5)

## 2014-07-04 LAB — BASIC METABOLIC PANEL
Anion gap: 7 (ref 5–15)
BUN: 18 mg/dL (ref 6–23)
CO2: 25 mmol/L (ref 19–32)
CREATININE: 0.72 mg/dL (ref 0.50–1.35)
Calcium: 9 mg/dL (ref 8.4–10.5)
Chloride: 99 mEq/L (ref 96–112)
GFR calc non Af Amer: >90 mL/min (ref >90)
Glucose, Bld: 149 mg/dL — ABNORMAL HIGH (ref 70–99)
Potassium: 4.1 mmol/L (ref 3.5–5.1)
SODIUM: 131 mmol/L — AB (ref 135–145)

## 2014-07-04 MED ORDER — HYDROCODONE-ACETAMINOPHEN 5-325 MG PO TABS: 1.0000 | ORAL_TABLET | ORAL | Status: DC | PRN

## 2014-07-04 NOTE — Discharge Summary (Signed)
Physician Discharge Summary  Patient ID: [name] MRN: [id number] DOB/AGE: 03/06/1941 74 y.o.  Admit date: 07/03/2014 Discharge date: 07/04/2014  Admission Diagnoses:  Recurrent left inguinal hernia  Discharge Diagnoses:  Same; post open repair  Active Problems:   Recurrent left inguinal hernia   Surgery:  Open left inguinal hernia repair  Discharged Condition: improved  Hospital Course:   Had surgery.  Kept overnight.  Did well and ready for discharge  Consults: none  Significant Diagnostic Studies: none    Discharge Exam: Blood pressure 137/66, pulse 61, temperature 98.3 F (36.8 C), temperature source Oral, resp. rate 18, height 5\' 11"  (1.803 m), weight 187 lb (84.823 kg), SpO2 96 %. Incision covered with Liquiban and looks good  Disposition: 01-Home or Self Care  Discharge Instructions    Diet - low sodium heart healthy    Complete by:  As directed      Discharge instructions    Complete by:  As directed   Avoid heavy lifting for at least 4 weeks May shower and get wet when you get home     Increase activity slowly    Complete by:  As directed      No wound care    Complete by:  As directed             Medication List    TAKE these medications        ALKA-SELTZER PLUS COLD PO  Take 1 tablet by mouth every 6 (six) hours as needed (COLD/FLU).     aspirin 325 MG tablet  Take 325 mg by mouth daily.     diltiazem 120 MG 24 hr capsule  Commonly known as:  CARDIZEM CD  Take 1 capsule (120 mg total) by mouth daily.     HYDROcodone-acetaminophen 5-325 MG per tablet  Commonly known as:  NORCO/VICODIN  Take 1-2 tablets by mouth every 4 (four) hours as needed for moderate pain.     losartan 50 MG tablet  Commonly known as:  COZAAR  Take 1 tablet (50 mg total) by mouth daily.     nitroGLYCERIN 0.4 MG SL tablet  Commonly known as:  NITROSTAT  Place 1 tablet (0.4 mg total) under the tongue every 5 (five) minutes x 3 doses as needed for chest pain.      ranitidine 150 MG tablet  Commonly known as:  ZANTAC  Take 150 mg by mouth daily.           Follow-up Information    Follow up with Johnathan Hausen B, MD In 4 weeks.   Specialty:  General Surgery   Why:  For wound re-check   Contact information:   [place] [address] [phone]       Signed: Pedro Earls 07/04/2014, 8:57 AM

## 2014-07-06 ENCOUNTER — Telehealth (INDEPENDENT_AMBULATORY_CARE_PROVIDER_SITE_OTHER): Payer: Self-pay | Admitting: Surgery

## 2014-07-06 NOTE — Telephone Encounter (Signed)
Jose Duarte had an open left inguinal hernia on 07/03/2014 by Dr. Hassell Done.  He was discharge on 07/05/2014.  His wife called and said he felt constipated and had not had a BM.  She has tried to give himi 2 fleets enemas without success.  I suggested backing his diet to clear liquids, but drink plenty of fluid.  For him to try to be active by walking around.  Try another enema later today if necessary.  She will call back if he continues to have problems.  Alphonsa Overall, MD, [facility] Surgery Pager: [phone] Office phone:  [phone]

## 2014-08-02 NOTE — Progress Notes (Signed)
Patient ID: Jose Duarte, male   DOB: 02-05-1941, 74 y.o.   MRN: [id number] 74 y.o.  self referral. Previously seen by [name]. Did not like [facility] practice. Wanted to change to [place]. On 03/01/13 He was taken acutely to the cath lab which revealed a totally occluded ramus branch. This was stented with a 3.0x16 mm Promus Premier DES stent postdilated 3.2 mm with 100% occlusion reduced to 0% and resumption of TIMI 3 flow. The patient recovered and progressed very well. He was started on brilinta. Lisinopril was increased due to hypertension. HR apparently dropped into the 30's for a short period of time during sheath pull. Likely a vagal response. Beta blocker was not added at that time however, the following day he was started on a low dose of lopressor which appeared to reduce the episodes of NSVT. He ambulated well with cardiac rehab but did experience some SOB which appears to have improved. 2D echo revealed an EF of 45-50%(see below).  Echo 04/20/13  Study Conclusions  - Left ventricle: Difficult acoustic windows limit study. LVEF is approximately 35% with diffuse hypokinesis, worse in the inferior and lateral walls. The cavity size was normal. Wall thickness was normal. Doppler parameters are consistent with abnormal left ventricular relaxation (grade 1 diastolic dysfunction). - Mitral valve: Mild regurgitation.  Had ETT with Dr Caryl Comes and no chronotropic incompetence beta blockers stopped  MRI: 04/25/13 EF 47%  IMPRESSION: 1) Mild LVE and mild LVH EF 47%  2) Nearly full thickness scar involving the lateral and inferolateral walls   Had left inguinal surgery 11/15 and did well    9/16  /15 TT negative but HTN response  Losartan increased  Review of BP readings home ok     Reviewed labs from Dr Arelia Sneddon  LDL 122 A1c 6.5  Has had leg pain with lipitor in past   ROS: Denies fever, malais, weight loss, blurry vision, decreased visual acuity, cough, sputum, SOB, hemoptysis, pleuritic  pain, palpitaitons, heartburn, abdominal pain, melena, lower extremity edema, claudication, or rash.  All other systems reviewed and negative  General: Affect appropriate Healthy:  appears stated age 81: normal Neck supple with no adenopathy JVP normal no bruits no thyromegaly Lungs clear with no wheezing and good diaphragmatic motion Heart:  S1/S2 no murmur, no rub, gallop or click PMI normal Abdomen: benighn, BS positve, no tenderness, no AAA recent left inguinal hernia repair  no bruit.  No HSM or HJR Distal pulses intact with no bruits No edema Neuro non-focal Skin warm and dry No muscular weakness   Current Outpatient Prescriptions  Medication Sig Dispense Refill  . aspirin 325 MG tablet Take 325 mg by mouth daily.    Marland Kitchen diltiazem (CARDIZEM CD) 120 MG 24 hr capsule Take 1 capsule (120 mg total) by mouth daily. (Patient taking differently: Take 120 mg by mouth daily. Patient takes at nite) 90 capsule 3  . losartan (COZAAR) 50 MG tablet Take 1 tablet (50 mg total) by mouth daily. 90 tablet 3  . nitroGLYCERIN (NITROSTAT) 0.4 MG SL tablet Place 1 tablet (0.4 mg total) under the tongue every 5 (five) minutes x 3 doses as needed for chest pain. 25 tablet 5  . ranitidine (ZANTAC) 150 MG tablet Take 150 mg by mouth daily.      No current facility-administered medications for this visit.    Allergies  Penicillins  Electrocardiogram:  SB rate 51 PVC  11/15   Assessment and Plan

## 2014-08-07 ENCOUNTER — Encounter: Payer: Self-pay | Admitting: Cardiovascular Disease

## 2014-08-07 ENCOUNTER — Ambulatory Visit (INDEPENDENT_AMBULATORY_CARE_PROVIDER_SITE_OTHER): Payer: Medicare HMO | Admitting: Cardiovascular Disease

## 2014-08-07 VITALS — BP 120/68 | HR 56 | Ht 71.0 in | Wt 194.4 lb

## 2014-08-07 DIAGNOSIS — I2510 Atherosclerotic heart disease of native coronary artery without angina pectoris: Secondary | ICD-10-CM

## 2014-08-07 DIAGNOSIS — Z9861 Coronary angioplasty status: Secondary | ICD-10-CM

## 2014-08-07 DIAGNOSIS — E785 Hyperlipidemia, unspecified: Secondary | ICD-10-CM

## 2014-08-07 DIAGNOSIS — E118 Type 2 diabetes mellitus with unspecified complications: Secondary | ICD-10-CM

## 2014-08-07 DIAGNOSIS — R001 Bradycardia, unspecified: Secondary | ICD-10-CM

## 2014-08-07 MED ORDER — ROSUVASTATIN CALCIUM 5 MG PO TABS: 5.0000 mg | ORAL_TABLET | Freq: Every day | ORAL | Status: DC

## 2014-08-07 NOTE — Assessment & Plan Note (Addendum)
Start crestor 5 mg  F/u labs [place] in 3 months   LDL 122

## 2014-08-07 NOTE — Patient Instructions (Addendum)
Your physician wants you to follow-up in: Jose Duarte will receive a reminder letter in the mail two months in advance. If you don't receive a letter, please call our office to schedule the follow-up appointment. Your physician recommends that you continue on your current medications as directed. Please refer to the Current Medication list given to you today. START  CRESTOR  5 MG AT  BEDTIME

## 2014-08-07 NOTE — Assessment & Plan Note (Signed)
Asymptomatic Normal HR response on ETT  Avoid beta blockers

## 2014-08-07 NOTE — Assessment & Plan Note (Signed)
Taking infrequent nitro  ETT 9/15 normal continue medical Rx  He will call if pattern changes Has nitro

## 2014-08-07 NOTE — Assessment & Plan Note (Signed)
Well controlled.  Continue current medications and low sodium Dash type diet.    

## 2014-08-07 NOTE — Assessment & Plan Note (Signed)
Not taking metformin Discussed low carb diet  F/U Arelia Sneddon

## 2014-12-10 ENCOUNTER — Encounter: Payer: Self-pay | Admitting: Cardiovascular Disease

## 2015-01-01 ENCOUNTER — Telehealth: Payer: Self-pay | Admitting: Cardiovascular Disease

## 2015-01-01 NOTE — Telephone Encounter (Signed)
New message     Pt said that Dr. Johnsie Cancel is wanting him to bring blood report to next week appointment.  PCP is wanting to know what blood work needs to be ran.  Please call to advise

## 2015-01-01 NOTE — Telephone Encounter (Signed)
PT NEEDS LIPID AND LIVER  DONE  CRESTOR  WAS  STARTED AT LAST OFFICE  VISIT  ORDER  FAXED TO  DR Tretha Sciara OFFICE .[place] NUMBER  [phone]

## 2015-01-20 ENCOUNTER — Ambulatory Visit: Payer: Self-pay | Admitting: Physician Assistant

## 2015-02-06 ENCOUNTER — Ambulatory Visit: Payer: Self-pay | Admitting: Cardiovascular Disease

## 2015-03-10 ENCOUNTER — Encounter: Payer: Self-pay | Admitting: *Deleted

## 2015-03-13 ENCOUNTER — Encounter: Payer: Self-pay | Admitting: Cardiovascular Disease

## 2015-03-13 ENCOUNTER — Ambulatory Visit (INDEPENDENT_AMBULATORY_CARE_PROVIDER_SITE_OTHER): Payer: Medicare HMO | Admitting: Cardiovascular Disease

## 2015-03-13 VITALS — BP 116/70 | HR 61 | Ht 71.0 in | Wt 195.8 lb

## 2015-03-13 DIAGNOSIS — R0602 Shortness of breath: Secondary | ICD-10-CM

## 2015-03-13 DIAGNOSIS — I10 Essential (primary) hypertension: Secondary | ICD-10-CM

## 2015-03-13 DIAGNOSIS — I2510 Atherosclerotic heart disease of native coronary artery without angina pectoris: Secondary | ICD-10-CM | POA: Diagnosis not present

## 2015-03-13 DIAGNOSIS — R001 Bradycardia, unspecified: Secondary | ICD-10-CM

## 2015-03-13 DIAGNOSIS — Z9861 Coronary angioplasty status: Secondary | ICD-10-CM

## 2015-03-13 MED ORDER — ROSUVASTATIN CALCIUM 10 MG PO TABS: 10.0000 mg | ORAL_TABLET | Freq: Every day | ORAL | Status: DC

## 2015-03-13 NOTE — Patient Instructions (Signed)
Medication Instructions:  Your physician has recommended you make the following change in your medication:  1) Start Crestor 10 mg daily   Labwork: None ordered  Testing/Procedures: A chest x-ray takes a picture of the organs and structures inside the chest, including the heart, lungs, and blood vessels. This test can show several things, including, whether the heart is enlarges; whether fluid is building up in the lungs; and whether pacemaker / defibrillator leads are still in place.  Your physician has requested that you have an echocardiogram. Echocardiography is a painless test that uses sound waves to create images of your heart. It provides your doctor with information about the size and shape of your heart and how well your heart's chambers and valves are working. This procedure takes approximately one hour. There are no restrictions for this procedure.  Your physician has requested that you have en exercise stress myoview. For further information please visit [email or web address]. Please follow instruction sheet, as given.      Follow-Up: Your physician wants you to follow-up in: 6 months with Dr Blima Singer will receive a reminder letter in the mail two months in advance. If you don't receive a letter, please call our office to schedule the follow-up appointment.   Any Other Special Instructions Will Be Listed Below (If Applicable).

## 2015-03-13 NOTE — Progress Notes (Signed)
Patient ID: Jose Duarte, male   DOB: Dec 26, 1940, 74 y.o.   MRN: [id number] 74 y.o.  self referral. Previously seen by [name]. Did not like [facility] practice. Wanted to change to [place]. On 03/01/13 He was taken acutely to the cath lab which revealed a totally occluded ramus branch. This was stented with a 3.0x16 mm Promus Premier DES stent postdilated 3.2 mm with 100% occlusion reduced to 0% and resumption of TIMI 3 flow. The patient recovered and progressed very well. He was started on brilinta. Lisinopril was increased due to hypertension. HR apparently dropped into the 30's for a short period of time during sheath pull. Likely a vagal response. Beta blocker was not added at that time however, the following day he was started on a low dose of lopressor which appeared to reduce the episodes of NSVT. He ambulated well with cardiac rehab but did experience some SOB which appears to have improved. 2D echo revealed an EF of 45-50%(see below).   Echo 04/20/13  Study Conclusions  - Left ventricle: Difficult acoustic windows limit study. LVEF is approximately 35% with diffuse hypokinesis, worse in the inferior and lateral walls. The cavity size was normal. Wall thickness was normal. Doppler parameters are consistent with abnormal left ventricular relaxation (grade 1 diastolic dysfunction). - Mitral valve: Mild regurgitation.  Had ETT with Dr Caryl Comes and no chronotropic incompetence beta blockers stopped  MRI: 04/25/13 EF 47%  IMPRESSION: 1) Mild LVE and mild LVH EF 47%  2) Nearly full thickness scar involving the lateral and inferolateral walls   Had left inguinal surgery 11/15 and did well    9/16  /15 TT negative but HTN response  Losartan increased  Review of BP readings home ok     Labs from Dr Arelia Sneddon reviewed:  LDL 66 TC 135  A1c7.2 BNP 43 Cr .9 normal LFTls Date 01/09/15  Having more fatigue and exertional dsypnea especially going up hills Worse last 3 months. Some post nasal drip at  night  Chest tight when breathing bad.  Can walk on flat surface with no issues  ROS: Denies fever, malais, weight loss, blurry vision, decreased visual acuity, cough, sputum, SOB, hemoptysis, pleuritic pain, palpitaitons, heartburn, abdominal pain, melena, lower extremity edema, claudication, or rash.  All other systems reviewed and negative  General: Affect appropriate Healthy:  appears stated age 88: normal Neck supple with no adenopathy JVP normal no bruits no thyromegaly Lungs clear with no wheezing and good diaphragmatic motion Heart:  S1/S2 no murmur, no rub, gallop or click PMI normal Abdomen: benighn, BS positve, no tenderness, no AAA recent left inguinal hernia repair  no bruit.  No HSM or HJR Distal pulses intact with no bruits No edema Neuro non-focal Skin warm and dry No muscular weakness   Current Outpatient Prescriptions  Medication Sig Dispense Refill  . aspirin 325 MG tablet Take 325 mg by mouth daily.    Marland Kitchen diltiazem (CARDIZEM CD) 120 MG 24 hr capsule Take 1 capsule (120 mg total) by mouth daily. 90 capsule 3  . losartan (COZAAR) 50 MG tablet Take 1 tablet (50 mg total) by mouth daily. 90 tablet 3  . metFORMIN (GLUCOPHAGE) 1000 MG tablet Take 2,000 mg by mouth daily.    . nitroGLYCERIN (NITROSTAT) 0.4 MG SL tablet Place 1 tablet (0.4 mg total) under the tongue every 5 (five) minutes x 3 doses as needed for chest pain. 25 tablet 5  . pantoprazole (PROTONIX) 40 MG tablet Take 40 mg by mouth daily.    Marland Kitchen  rosuvastatin (CRESTOR) 5 MG tablet Take 1 tablet (5 mg total) by mouth daily. 30 tablet 11  . tamsulosin (FLOMAX) 0.4 MG CAPS capsule Take 0.4 mg by mouth daily.     No current facility-administered medications for this visit.    Allergies  Penicillins  Electrocardiogram:  SB rate 51 PVC  11/15  03/13/15  Sr rate 61 LAD ICRBBB   Assessment and Plan CAD:  Stent to ramus in 2014 exertional dyspnea f/u stress myovue  HTN:  On ARB given low EF stable DCM:   Ischemic EF 47% by MRI no clinical CHF  With dyspnea will check echo and CXR Encouraging that BNP normal in July Chol:  Labs from Dr Arelia Sneddon reviewed. Continue crestor  New script for 10 mg called in ( he will take 1/2 tablet but save money this way)  F/U with me 6 months unless tests abnormal Ordered Echo, exercise myovue and CXR

## 2015-03-17 ENCOUNTER — Ambulatory Visit (INDEPENDENT_AMBULATORY_CARE_PROVIDER_SITE_OTHER)
Admission: RE | Admit: 2015-03-17 | Discharge: 2015-03-17 | Disposition: A | Discharge status: Home / Self Care | Payer: Medicare HMO | Source: Ambulatory Visit | Attending: Cardiovascular Disease | Admitting: Cardiovascular Disease

## 2015-03-17 DIAGNOSIS — R0602 Shortness of breath: Secondary | ICD-10-CM | POA: Diagnosis not present

## 2015-03-20 ENCOUNTER — Telehealth (HOSPITAL_COMMUNITY): Payer: Self-pay

## 2015-03-20 ENCOUNTER — Telehealth (HOSPITAL_COMMUNITY): Payer: Self-pay | Admitting: *Deleted

## 2015-03-20 NOTE — Telephone Encounter (Signed)
Left message on voicemail in reference to upcoming appointment scheduled for 03/24/15. Phone number given for a call back so details instructions can be given. Hubbard Robinson, RN

## 2015-03-20 NOTE — Telephone Encounter (Signed)
Patient given detailed instructions per Myocardial Perfusion Study Information Sheet for test on 03/24/15 at 9:45. Patient notified to arrive 15 minutes early and that it is imperative to arrive on time for appointment to keep from having the test rescheduled.  If you need to cancel or reschedule your appointment, please call the office within 24 hours of your appointment. Failure to do so may result in a cancellation of your appointment, and a $50 no show fee. Patient verbalized understanding. Annye Rusk

## 2015-03-24 ENCOUNTER — Ambulatory Visit (HOSPITAL_COMMUNITY): Payer: Medicare HMO | Attending: Cardiology

## 2015-03-24 ENCOUNTER — Other Ambulatory Visit: Payer: Self-pay

## 2015-03-24 ENCOUNTER — Ambulatory Visit (HOSPITAL_BASED_OUTPATIENT_CLINIC_OR_DEPARTMENT_OTHER): Payer: Medicare HMO

## 2015-03-24 DIAGNOSIS — R0609 Other forms of dyspnea: Secondary | ICD-10-CM | POA: Diagnosis not present

## 2015-03-24 DIAGNOSIS — Z9861 Coronary angioplasty status: Secondary | ICD-10-CM

## 2015-03-24 DIAGNOSIS — I2510 Atherosclerotic heart disease of native coronary artery without angina pectoris: Secondary | ICD-10-CM | POA: Insufficient documentation

## 2015-03-24 DIAGNOSIS — I10 Essential (primary) hypertension: Secondary | ICD-10-CM | POA: Diagnosis not present

## 2015-03-24 DIAGNOSIS — R0600 Dyspnea, unspecified: Secondary | ICD-10-CM | POA: Insufficient documentation

## 2015-03-24 DIAGNOSIS — I517 Cardiomegaly: Secondary | ICD-10-CM | POA: Insufficient documentation

## 2015-03-24 DIAGNOSIS — R9439 Abnormal result of other cardiovascular function study: Secondary | ICD-10-CM | POA: Diagnosis not present

## 2015-03-24 DIAGNOSIS — I358 Other nonrheumatic aortic valve disorders: Secondary | ICD-10-CM | POA: Diagnosis not present

## 2015-03-24 DIAGNOSIS — I340 Nonrheumatic mitral (valve) insufficiency: Secondary | ICD-10-CM | POA: Insufficient documentation

## 2015-03-24 DIAGNOSIS — I351 Nonrheumatic aortic (valve) insufficiency: Secondary | ICD-10-CM | POA: Diagnosis not present

## 2015-03-24 DIAGNOSIS — R0602 Shortness of breath: Secondary | ICD-10-CM | POA: Diagnosis not present

## 2015-03-24 DIAGNOSIS — I071 Rheumatic tricuspid insufficiency: Secondary | ICD-10-CM | POA: Insufficient documentation

## 2015-03-24 DIAGNOSIS — E119 Type 2 diabetes mellitus without complications: Secondary | ICD-10-CM | POA: Diagnosis not present

## 2015-03-24 HISTORY — PX: TRANSTHORACIC ECHOCARDIOGRAM: SHX275

## 2015-03-24 LAB — MYOCARDIAL PERFUSION IMAGING
CHL CUP MPHR: 147 {beats}/min
CHL CUP RESTING HR STRESS: 50 {beats}/min
CSEPEDS: 30 s
CSEPEW: 10.1 METS
Exercise duration (min): 9 min
LV sys vol: 56 mL
LVDIAVOL: 117 mL
NUC STRESS TID: 0.77
Peak HR: 127 {beats}/min
Percent HR: 86 %
RATE: 0.34
SDS: 0
SRS: 12
SSS: 12

## 2015-03-24 MED ORDER — TECHNETIUM TC 99M SESTAMIBI GENERIC - CARDIOLITE
10.8000 | Freq: Once | INTRAVENOUS | Status: AC | PRN
  Administered 2015-03-24: 11 via INTRAVENOUS

## 2015-03-24 MED ORDER — TECHNETIUM TC 99M SESTAMIBI GENERIC - CARDIOLITE
31.7000 | Freq: Once | INTRAVENOUS | Status: AC | PRN
  Administered 2015-03-24: 31.7 via INTRAVENOUS

## 2015-03-25 ENCOUNTER — Telehealth: Payer: Self-pay | Admitting: Cardiovascular Disease

## 2015-03-25 NOTE — Telephone Encounter (Signed)
F/u  Pt wife returning :Lynn's phone call concerning test results. Please call back and discuss.

## 2015-03-25 NOTE — Telephone Encounter (Signed)
**Note De-Identified Jose Duarte Obfuscation** The pt has been given his Stress test and Echo results per Dr Johnsie Cancel. He verbalized understanding.

## 2015-04-29 ENCOUNTER — Other Ambulatory Visit: Payer: Self-pay | Admitting: Nurse Practitioner

## 2015-08-11 ENCOUNTER — Other Ambulatory Visit: Payer: Self-pay | Admitting: Cardiovascular Disease

## 2015-08-11 NOTE — Telephone Encounter (Signed)
REFILL 

## 2015-09-10 NOTE — Progress Notes (Signed)
Patient ID: Jose Duarte, male   DOB: 23-Jan-1941, 75 y.o.   MRN: [id number]   75 y.o.  self referral. Previously seen by [name]. Did not like [facility] practice. Wanted to change to [place]. On 03/01/13 He was taken acutely to the cath lab which revealed a totally occluded ramus branch. This was stented with a 3.0x16 mm Promus Premier DES stent postdilated 3.2 mm with 100% occlusion reduced to 0% and resumption of TIMI 3 flow.   03/06/14  ETT normal HTN response and PVCls BP meds adjusted 03/24/15 Myovue old scar no ischemia EF 52%  Medical Rx recommended   Nuclear stress EF: 52%.  There was no ST segment deviation noted during stress.  Defect 1: There is a large defect of moderate severity present in the basal inferolateral, basal anterolateral, mid inferolateral and mid anterolateral location. The patient has had a known lateral wall MI. There is no ischemia. His LV function is at the lower end of normal .  This is a low risk study.  Echo 03/24/15  EF a bit better than immediate post PCI/Stent  Study Conclusions  - Left ventricle: There is basal and mid inferior and inferolateral  hypokinesis. The cavity size was mildly dilated. There was mild  focal basal hypertrophy of the septum. Systolic function was  mildly reduced. The estimated ejection fraction was in the range  of 45% to 50%. Doppler parameters are consistent with abnormal  left ventricular relaxation (grade 1 diastolic dysfunction).  There was no evidence of elevated ventricular filling pressure by  Doppler parameters. - Aortic valve: Trileaflet; mildly thickened, mildly calcified  leaflets. Sclerosis without stenosis. There was mild  regurgitation. - Aortic root: The aortic root was normal in size. - Mitral valve: Structurally normal valve. There was mild  regurgitation directed centrally. - Left atrium: The atrium was mildly dilated. - Right ventricle: Systolic function was normal. - Right atrium: The atrium  was normal in size. - Tricuspid valve: There was mild regurgitation. - Pulmonary arteries: Systolic pressure was within the normal  range. - Inferior vena cava: The vessel was normal in size. The  respirophasic diameter changes were in the normal range (= 50%),  consistent with normal central venous pressure.  Had left inguinal surgery 11/15 and did well   Flomax causing headache Needs to f/u with Dr Edwena Blow    ROS: Denies fever, malais, weight loss, blurry vision, decreased visual acuity, cough, sputum, SOB, hemoptysis, pleuritic pain, palpitaitons, heartburn, abdominal pain, melena, lower extremity edema, claudication, or rash.  All other systems reviewed and negative  General: Affect appropriate Healthy:  appears stated age 75: normal Neck supple with no adenopathy JVP normal no bruits no thyromegaly Lungs clear with no wheezing and good diaphragmatic motion Heart:  S1/S2 no murmur, no rub, gallop or click PMI normal Abdomen: benighn, BS positve, no tenderness, no AAA recent left inguinal hernia repair  no bruit.  No HSM or HJR Distal pulses intact with no bruits No edema Neuro non-focal Skin warm and dry No muscular weakness   Current Outpatient Prescriptions  Medication Sig Dispense Refill  . aspirin 325 MG tablet Take 325 mg by mouth daily.    Marland Kitchen losartan (COZAAR) 50 MG tablet TAKE ONE TABLET BY MOUTH ONCE DAILY 90 tablet 3  . metFORMIN (GLUCOPHAGE) 1000 MG tablet Take 2,000 mg by mouth daily.    Marland Kitchen NITROSTAT 0.4 MG SL tablet DISSOLVE ONE TABLET UNDER THE TONGUE EVERY 5 MINUTES AS NEEDED FOR CHEST PAIN.  DO  NOT EXCEED A TOTAL OF 3 DOSES IN 15 MINUTES 25 tablet 3  . pantoprazole (PROTONIX) 40 MG tablet Take 40 mg by mouth daily.    . rosuvastatin (CRESTOR) 10 MG tablet Take 1 tablet (10 mg total) by mouth daily. 90 tablet 3  . tamsulosin (FLOMAX) 0.4 MG CAPS capsule Take 0.4 mg by mouth daily. Reported on 09/16/2015     No current facility-administered medications  for this visit.    Allergies  Penicillins  Electrocardiogram:  SB rate 51 PVC  11/15  03/13/15  Sr rate 61 LAD ICRBBB   Assessment and Plan CAD:  Stent to ramus in 2014 no ischemia on myovue 03/2014 medical Rx  HTN:  On ARB given low EF stable DCM:  Ischemic EF 45-50% echo 03/24/2015 and 52% myovue same day  Chol:  Labs from Dr Arelia Sneddon reviewed. Continue crestor  New script for 10 mg called in ( he will take 1/2 tablet but save money this way) DM:  A1c 6.2 Dr Welton Flakes follows continue oral agents and low carb diet Prostate:  F/u urology consider changing flomax to proscar to avoid headaches   F/U with me in a year   Jenkins Rouge

## 2015-09-16 ENCOUNTER — Encounter: Payer: Self-pay | Admitting: Cardiovascular Disease

## 2015-09-16 ENCOUNTER — Ambulatory Visit (INDEPENDENT_AMBULATORY_CARE_PROVIDER_SITE_OTHER): Payer: Medicare HMO | Admitting: Cardiovascular Disease

## 2015-09-16 VITALS — BP 148/82 | HR 58 | Ht 71.0 in | Wt 192.4 lb

## 2015-09-16 DIAGNOSIS — I10 Essential (primary) hypertension: Secondary | ICD-10-CM | POA: Diagnosis not present

## 2015-09-16 NOTE — Patient Instructions (Signed)

## 2015-12-09 ENCOUNTER — Encounter: Payer: Self-pay | Admitting: Cardiovascular Disease

## 2016-03-17 ENCOUNTER — Other Ambulatory Visit: Payer: Self-pay | Admitting: Urology

## 2016-03-22 ENCOUNTER — Other Ambulatory Visit: Payer: Self-pay | Admitting: Urology

## 2016-03-23 ENCOUNTER — Encounter (HOSPITAL_BASED_OUTPATIENT_CLINIC_OR_DEPARTMENT_OTHER): Payer: Self-pay | Admitting: *Deleted

## 2016-03-24 ENCOUNTER — Encounter (HOSPITAL_BASED_OUTPATIENT_CLINIC_OR_DEPARTMENT_OTHER): Payer: Self-pay | Admitting: *Deleted

## 2016-03-24 ENCOUNTER — Other Ambulatory Visit: Payer: Self-pay | Admitting: Cardiovascular Disease

## 2016-03-24 NOTE — Progress Notes (Signed)
Pt instructed npo pmn 10/5 x protonix w sip of water..  To [facility] 10/6 @ 0830.  istat ? ekg on arrival.

## 2016-03-25 NOTE — Discharge Instructions (Signed)
Postoperative instructions for circumcision ° °Wound: ° °In most cases your incision will have absorbable sutures that run along the course of your incision and will dissolve within the first 10-20 days. Some will fall out even earlier. Expect some redness as the sutures dissolved but this should occur only around the sutures. If there is generalized redness, especially with increasing pain or swelling, let us know. The penis will very likely get "black and blue" as the blood in the tissues spread. Sometimes the whole penis will turn colors. The black and blue is followed by a yellow and brown color. In time, all the discoloration will go away. ° °Diet: ° °You may return to your normal diet within 24 hours following your surgery. You may note some mild nausea and possibly vomiting the first 6-8 hours following surgery. This is usually due to the side effects of anesthesia, and will disappear quite soon. I would suggest clear liquids and a very light meal the first evening following your surgery. ° °Activity: ° °Your physical activity should be restricted the first 48 hours. During that time you should remain relatively inactive, moving about only when necessary. During the first 7-10 days following surgery he should avoid lifting any heavy objects (anything greater than 15 pounds), and avoid strenuous exercise. If you work, ask us specifically about your restrictions, both for work and home. We will write a note to your employer if needed. ° °Ice packs can be placed on and off over the penis for the first 48 hours to help relieve the pain and keep the swelling down. Frozen peas or corn in a ZipLock bag can be frozen, used and re-frozen. Fifteen minutes on and 15 minutes off is a reasonable schedule.  ° °Hygiene: ° °You may shower 48 hours after your surgery. Tub bathing should be restricted until the seventh day. ° °Medication: ° °You will be sent home with some type of pain medication. In many cases you will be  sent home with a narcotic pain pill (Vicodin or Tylox). If the pain is not too bad, you may take either Tylenol (acetaminophen) or Advil (ibuprofen) which contain no narcotic agents, and might be tolerated a little better, with fewer side effects. If the pain medication you are sent home with does not control the pain, you will have to let us know. Some narcotic pain medications cannot be given or refilled by a phone call to a pharmacy. ° °Problems you should report to us: ° °· Fever of 101.0 degrees Fahrenheit or greater. °· Moderate or severe swelling under the skin incision or involving the scrotum. °· Drug reaction such as hives, a rash, nausea or vomiting.  ° ° °

## 2016-03-25 NOTE — H&P (Signed)
HPI: He has had difficulties with rolling his foreskin back for 1 year. His symptoms have gotten worse over the last year. He does have diabetes.   He does not have dysuria.   He would like to be circumcised.   He said he has developed some progressive difficulty retracting his foreskin and being a diabetic he is concerned about hygiene should this continue. He has not had any difficulty with balanitis.     ALLERGIES: Penicillins    MEDICATIONS: Aspirin 325 MG Oral Tablet Delayed Release Oral  Cartia XT 120 MG Oral Capsule Extended Release 24 Hour Oral  Crestor 5 MG Oral Tablet Oral  Losartan Potassium 50 MG Oral Tablet Oral  MetFORMIN HCl - 500 MG Oral Tablet Oral  Nitrostat 0.4 MG Sublingual Tablet Sublingual Sublingual  Pantoprazole Sodium 40 MG Oral Tablet Delayed Release Oral  Rapaflo 8 MG Oral Capsule 0 Oral  Sildenafil Citrate 20 MG Oral Tablet 0 Oral  Tamsulosin HCl - 0.4 MG Oral Capsule 1 Oral Daily     GU PSH: Hernia Repair - 02/18/2015      PSH Notes: Hernia Repair, Colon Surgery, Cholecystectomy   NON-GU PSH: Cholecystectomy - 2010    GU PMH: BPH w/LUTS, Benign prostatic hyperplasia with urinary obstruction and other lower urinary tract symptoms - 03/11/2015 ED, arterial insufficiency, Erectile dysfunction due to arterial insufficiency - 03/11/2015 Kidney Stone, Left, upper pole left kidney - 02/18/2015      PMH Notes: Calculus disease: He passed a left ureteral stone 2009. A CT scan done in 12/09 revealed bilateral renal calculi. A CT scan done in 12/10 revealed a stone in the lower pole of his right kidney and a tiny punctate stone in the midpole the left kidney as well as a vascular calcification seen on the left-hand side. Right-sided retroperitoneal soft tissue masses were identified and confirmed to be hyper metabolic by PET scan done on 05/09/10.  Stone analysis: calcium oxalate monohydrate.    BPH with LUTS: He was experiencing frequency, urgency and nocturia  3 with a weak urinary stream.  Treatment: Tamsulosin   Erectile dysfunction: He reported difficulty with erectile dysfunction in 9/16. This had been a problem for some time and had progressively worsened. He takes nitroglycerin on rare occasion. He was properly counseled about the interaction between phosphodiesterase inhibitors and nitroglycerin.  Treatment: Sildenafil     NON-GU PMH: Encounter for general adult medical examination without abnormal findings, Encounter for preventive health examination - 02/18/2015 Cerebral infarction, unspecified, Stroke Syndrome - 2014 Personal history of other diseases of the circulatory system, History of hypertension - 2014 Personal history of other endocrine, nutritional and metabolic disease, History of hypercholesterolemia - 2014    FAMILY HISTORY: Family Health Status Number - Runs In Family No pertinent family history - Other   SOCIAL HISTORY: Marital Status: Married Current Smoking Status: Patient has never smoked.  Has never drank.  Drinks 3 caffeinated drinks per day.     Notes: Never a smoker, Caffeine Use, Marital History - Currently Married   REVIEW OF SYSTEMS:    GU Review Male:   Patient reports get up at night to urinate and erection problems. Patient denies frequent urination, hard to postpone urination, burning/ pain with urination, leakage of urine, stream starts and stops, trouble starting your stream, have to strain to urinate , and penile pain.  Gastrointestinal (Upper):   Patient reports nausea. Patient denies vomiting and indigestion/ heartburn.  Gastrointestinal (Lower):   Patient denies diarrhea and constipation.  Constitutional:  Patient reports night sweats. Patient denies fever, weight loss, and fatigue.  Skin:   Patient denies skin rash/ lesion and itching.  Eyes:   Patient denies blurred vision and double vision.  Ears/ Nose/ Throat:   Patient reports sinus problems. Patient denies sore throat.  Hematologic/Lymphatic:    Patient denies swollen glands and easy bruising.  Cardiovascular:   Patient denies leg swelling and chest pains.  Respiratory:   Patient denies cough and shortness of breath.  Endocrine:   Patient denies excessive thirst.  Musculoskeletal:   Patient denies joint pain and back pain.  Neurological:   Patient denies headaches and dizziness.  Psychologic:   Patient denies depression and anxiety.   VITAL SIGNS:      Weight 195 lb / 88.45 kg  Height 71 in / 180.34 cm  BP 122/69 mmHg  Pulse 62 /min  BMI 27.2 kg/m     Anus anPhysical Exam  Constitutional: Well nourished and well developed . No acute distress.   ENT:. The ears and nose are normal in appearance.   Neck: The appearance of the neck is normal and no neck mass is present.   Pulmonary: No respiratory distress and normal respiratory rhythm and effort.   Cardiovascular: Heart rate and rhythm are normal . No peripheral edema.   Abdomen: The abdomen is soft and nontender. No masses are palpated. No CVA tenderness. No hernias are palpable. No hepatosplenomegaly noted.   Penis: Penis uncircumcised, phimosis. No foreskin warts, no cracks. No dorsal peyronie's plaques, no left corporal peyronie's plaques, no right corporal peyronie's plaques, no scarring, no shaft warts. No balanitis, no meatal stenosis.   Prostate: 40 gram or 2+ size. Left lobe normal consistency, right lobe normal consistency. Symmetrical lobes. No prostate nodule. Left lobe no tenderness, right lobe no tenderness.  Seminal Vesicles: Nonpalpable.  Sphincter Tone: Normal sphincter. No rectal tenderness. No rectal mass.    Lymphatics: The femoral and inguinal nodes are not enlarged or tender.   Skin: Normal skin turgor, no visible rash and no visible skin lesions.   MULTI-SYSTEM PHYSICAL EXAMINATION:       PAST DATA REVIEWED:  Source Of History:  Patient, Outside Source  Lab Test Review:   PSA  Records Review:   Previous Patient Records, POC Tool    PROCEDURES:          Urinalysis - 81003 Dipstick Dipstick Cont'd  Specimen: Voided Bilirubin: Neg  Color: Yellow Ketones: Neg  Appearance: Clear Blood: Neg  Specific Gravity: 1.025 Protein: Neg  pH: 5.0 Urobilinogen: 0.2  Glucose: 3+ Nitrites: Neg    Leukocyte Esterase: Neg    ASSESSMENT:      ICD-10 Details  1 GU:   BPH w/LUTS - N40.1 Worsening - He does have BPH by exam and has had worsening obstructive symptoms. He has not been able to tolerate alpha blockade therapy in the past. His PSA remains low.  2   Nocturia - R35.1 Worsening - Since he not tolerate alpha blockers we discussed using a 5 alpha reductase inhibitor. He is interested in beginning this medication.  3   Encounter for Prostate Cancer screening - Z12.5 Stable - His prostate was noted to be benign and his PSA was 1.84.  4   Phimosis - N47.1 He has developed mild, progressive phimosis. We therefore discussed the options and he is interested in circumcision. I therefore went over the procedure with detail including the incision used, the risks and complications, the probability of success, the outpatient nature  of the procedure as well as anticipated postoperative course. He understands and has elected to proceed.   PLAN: Circumcision

## 2016-03-26 ENCOUNTER — Ambulatory Visit (HOSPITAL_BASED_OUTPATIENT_CLINIC_OR_DEPARTMENT_OTHER)
Admission: RE | Admit: 2016-03-26 | Discharge: 2016-03-26 | Disposition: A | Discharge status: Home / Self Care | Payer: Medicare HMO | Source: Ambulatory Visit | Attending: Urology | Admitting: Urology

## 2016-03-26 ENCOUNTER — Encounter (HOSPITAL_BASED_OUTPATIENT_CLINIC_OR_DEPARTMENT_OTHER): Payer: Self-pay | Admitting: *Deleted

## 2016-03-26 ENCOUNTER — Encounter (HOSPITAL_BASED_OUTPATIENT_CLINIC_OR_DEPARTMENT_OTHER)
Admission: RE | Disposition: A | Discharge status: Home / Self Care | Payer: Self-pay | Source: Ambulatory Visit | Attending: Urology

## 2016-03-26 ENCOUNTER — Ambulatory Visit (HOSPITAL_BASED_OUTPATIENT_CLINIC_OR_DEPARTMENT_OTHER): Payer: Medicare HMO | Admitting: Anesthesiology

## 2016-03-26 DIAGNOSIS — N471 Phimosis: Secondary | ICD-10-CM | POA: Diagnosis not present

## 2016-03-26 DIAGNOSIS — Z79899 Other long term (current) drug therapy: Secondary | ICD-10-CM | POA: Diagnosis not present

## 2016-03-26 DIAGNOSIS — N138 Other obstructive and reflux uropathy: Secondary | ICD-10-CM | POA: Diagnosis not present

## 2016-03-26 DIAGNOSIS — E119 Type 2 diabetes mellitus without complications: Secondary | ICD-10-CM | POA: Diagnosis not present

## 2016-03-26 DIAGNOSIS — N5201 Erectile dysfunction due to arterial insufficiency: Secondary | ICD-10-CM | POA: Diagnosis not present

## 2016-03-26 DIAGNOSIS — R351 Nocturia: Secondary | ICD-10-CM | POA: Diagnosis not present

## 2016-03-26 DIAGNOSIS — Z7982 Long term (current) use of aspirin: Secondary | ICD-10-CM | POA: Diagnosis not present

## 2016-03-26 DIAGNOSIS — K219 Gastro-esophageal reflux disease without esophagitis: Secondary | ICD-10-CM | POA: Insufficient documentation

## 2016-03-26 DIAGNOSIS — I10 Essential (primary) hypertension: Secondary | ICD-10-CM | POA: Insufficient documentation

## 2016-03-26 DIAGNOSIS — Z7984 Long term (current) use of oral hypoglycemic drugs: Secondary | ICD-10-CM | POA: Insufficient documentation

## 2016-03-26 DIAGNOSIS — Z8673 Personal history of transient ischemic attack (TIA), and cerebral infarction without residual deficits: Secondary | ICD-10-CM | POA: Diagnosis not present

## 2016-03-26 DIAGNOSIS — E7800 Pure hypercholesterolemia, unspecified: Secondary | ICD-10-CM | POA: Insufficient documentation

## 2016-03-26 DIAGNOSIS — N401 Enlarged prostate with lower urinary tract symptoms: Secondary | ICD-10-CM | POA: Insufficient documentation

## 2016-03-26 DIAGNOSIS — I2510 Atherosclerotic heart disease of native coronary artery without angina pectoris: Secondary | ICD-10-CM | POA: Insufficient documentation

## 2016-03-26 DIAGNOSIS — I252 Old myocardial infarction: Secondary | ICD-10-CM | POA: Diagnosis not present

## 2016-03-26 DIAGNOSIS — Z955 Presence of coronary angioplasty implant and graft: Secondary | ICD-10-CM | POA: Insufficient documentation

## 2016-03-26 HISTORY — DX: Personal history of adenomatous and serrated colon polyps: Z86.0101

## 2016-03-26 HISTORY — DX: Personal history of transient ischemic attack (TIA), and cerebral infarction without residual deficits: Z86.73

## 2016-03-26 HISTORY — DX: Type 2 diabetes mellitus without complications: E11.9

## 2016-03-26 HISTORY — DX: Presence of spectacles and contact lenses: Z97.3

## 2016-03-26 HISTORY — PX: CIRCUMCISION: SHX1350

## 2016-03-26 HISTORY — DX: Presence of coronary angioplasty implant and graft: Z95.5

## 2016-03-26 HISTORY — DX: Personal history of other malignant neoplasm of skin: Z85.828

## 2016-03-26 HISTORY — DX: Diverticulosis of large intestine without perforation or abscess without bleeding: K57.30

## 2016-03-26 HISTORY — DX: Obstructive sleep apnea (adult) (pediatric): G47.33

## 2016-03-26 HISTORY — DX: Polyneuropathy, unspecified: G62.9

## 2016-03-26 HISTORY — DX: Old myocardial infarction: I25.2

## 2016-03-26 HISTORY — DX: Unspecified right bundle-branch block: I45.10

## 2016-03-26 HISTORY — DX: Phimosis: N47.1

## 2016-03-26 HISTORY — DX: Acute myocardial infarction, unspecified: I21.9

## 2016-03-26 HISTORY — DX: Personal history of colonic polyps: Z86.010

## 2016-03-26 HISTORY — DX: Unspecified cataract: H26.9

## 2016-03-26 HISTORY — DX: Ischemic cardiomyopathy: I25.5

## 2016-03-26 LAB — POCT I-STAT, CHEM 8
BUN: 17 mg/dL (ref 6–20)
Calcium, Ion: 1.25 mmol/L (ref 1.15–1.40)
Chloride: 102 mmol/L (ref 101–111)
Creatinine, Ser: 0.9 mg/dL (ref 0.61–1.24)
Glucose, Bld: 118 mg/dL — ABNORMAL HIGH (ref 65–99)
HCT: 44 % (ref 39.0–52.0)
Hemoglobin: 15 g/dL (ref 13.0–17.0)
Potassium: 4.1 mmol/L (ref 3.5–5.1)
Sodium: 140 mmol/L (ref 135–145)
TCO2: 23 mmol/L (ref 0–100)

## 2016-03-26 LAB — GLUCOSE, CAPILLARY: Glucose-Capillary: 128 mg/dL — ABNORMAL HIGH (ref 65–99)

## 2016-03-26 SURGERY — CIRCUMCISION, ADULT
Anesthesia: General | Site: Penis

## 2016-03-26 MED ORDER — BUPIVACAINE HCL 0.5 % IJ SOLN
INTRAMUSCULAR | Status: DC | PRN
  Administered 2016-03-26: 10 mL

## 2016-03-26 MED ORDER — HYDROCODONE-ACETAMINOPHEN 10-325 MG PO TABS: 1.0000 | ORAL_TABLET | ORAL | 0 refills | Status: DC | PRN

## 2016-03-26 MED ORDER — BACITRACIN-NEOMYCIN-POLYMYXIN 400-5-5000 EX OINT
TOPICAL_OINTMENT | CUTANEOUS | Status: DC | PRN
  Administered 2016-03-26: 1 via TOPICAL

## 2016-03-26 MED ORDER — METOCLOPRAMIDE HCL 5 MG/ML IJ SOLN
INTRAMUSCULAR | Status: DC | PRN
  Administered 2016-03-26: 10 mg via INTRAVENOUS

## 2016-03-26 MED ORDER — METOCLOPRAMIDE HCL 5 MG/ML IJ SOLN
INTRAMUSCULAR | Status: AC
  Filled 2016-03-26: qty 2

## 2016-03-26 MED ORDER — OXYCODONE HCL 5 MG/5ML PO SOLN
5.0000 mg | Freq: Once | ORAL | Status: DC | PRN
  Filled 2016-03-26: qty 5

## 2016-03-26 MED ORDER — ONDANSETRON HCL 4 MG/2ML IJ SOLN
4.0000 mg | Freq: Four times a day (QID) | INTRAMUSCULAR | Status: DC | PRN
  Filled 2016-03-26: qty 2

## 2016-03-26 MED ORDER — FENTANYL CITRATE (PF) 100 MCG/2ML IJ SOLN
INTRAMUSCULAR | Status: DC | PRN
  Administered 2016-03-26 (×2): 25 ug via INTRAVENOUS
  Administered 2016-03-26: 50 ug via INTRAVENOUS

## 2016-03-26 MED ORDER — EPHEDRINE SULFATE-NACL 50-0.9 MG/10ML-% IV SOSY
PREFILLED_SYRINGE | INTRAVENOUS | Status: DC | PRN
  Administered 2016-03-26: 20 mg via INTRAVENOUS
  Administered 2016-03-26: 10 mg via INTRAVENOUS
  Administered 2016-03-26: 20 mg via INTRAVENOUS

## 2016-03-26 MED ORDER — LACTATED RINGERS IV SOLN
INTRAVENOUS | Status: DC
  Administered 2016-03-26 (×2): via INTRAVENOUS
  Filled 2016-03-26: qty 1000

## 2016-03-26 MED ORDER — DEXAMETHASONE SODIUM PHOSPHATE 4 MG/ML IJ SOLN
INTRAMUSCULAR | Status: DC | PRN
  Administered 2016-03-26: 10 mg via INTRAVENOUS

## 2016-03-26 MED ORDER — EPHEDRINE 5 MG/ML INJ
INTRAVENOUS | Status: AC
  Filled 2016-03-26: qty 10

## 2016-03-26 MED ORDER — DEXAMETHASONE SODIUM PHOSPHATE 10 MG/ML IJ SOLN
INTRAMUSCULAR | Status: AC
  Filled 2016-03-26: qty 1

## 2016-03-26 MED ORDER — FENTANYL CITRATE (PF) 100 MCG/2ML IJ SOLN
25.0000 ug | INTRAMUSCULAR | Status: DC | PRN
  Filled 2016-03-26: qty 1

## 2016-03-26 MED ORDER — FENTANYL CITRATE (PF) 100 MCG/2ML IJ SOLN
INTRAMUSCULAR | Status: AC
  Filled 2016-03-26: qty 2

## 2016-03-26 MED ORDER — DEXAMETHASONE SODIUM PHOSPHATE 4 MG/ML IJ SOLN: INTRAMUSCULAR | Status: DC | PRN

## 2016-03-26 MED ORDER — PROPOFOL 10 MG/ML IV BOLUS
INTRAVENOUS | Status: AC
  Filled 2016-03-26: qty 20

## 2016-03-26 MED ORDER — ONDANSETRON HCL 4 MG/2ML IJ SOLN
INTRAMUSCULAR | Status: AC
  Filled 2016-03-26: qty 2

## 2016-03-26 MED ORDER — CIPROFLOXACIN IN D5W 200 MG/100ML IV SOLN
200.0000 mg | Freq: Two times a day (BID) | INTRAVENOUS | Status: DC
  Administered 2016-03-26: 200 mg via INTRAVENOUS
  Filled 2016-03-26: qty 100

## 2016-03-26 MED ORDER — LIDOCAINE 2% (20 MG/ML) 5 ML SYRINGE
INTRAMUSCULAR | Status: AC
  Filled 2016-03-26: qty 5

## 2016-03-26 MED ORDER — OXYCODONE HCL 5 MG PO TABS
5.0000 mg | ORAL_TABLET | Freq: Once | ORAL | Status: DC | PRN
  Filled 2016-03-26: qty 1

## 2016-03-26 MED ORDER — PROPOFOL 10 MG/ML IV BOLUS
INTRAVENOUS | Status: DC | PRN
  Administered 2016-03-26: 150 mg via INTRAVENOUS

## 2016-03-26 MED ORDER — LIDOCAINE 2% (20 MG/ML) 5 ML SYRINGE
INTRAMUSCULAR | Status: DC | PRN
  Administered 2016-03-26: 80 mg via INTRAVENOUS

## 2016-03-26 MED ORDER — CIPROFLOXACIN IN D5W 200 MG/100ML IV SOLN
INTRAVENOUS | Status: AC
  Filled 2016-03-26: qty 100

## 2016-03-26 SURGICAL SUPPLY — 27 items
BANDAGE CO FLEX L/F 1IN X 5YD (GAUZE/BANDAGES/DRESSINGS) IMPLANT
BANDAGE CO FLEX L/F 2IN X 5YD (GAUZE/BANDAGES/DRESSINGS) ×2 IMPLANT
BLADE CLIPPER SURG (BLADE) ×2 IMPLANT
BLADE SURG 15 STRL LF DISP TIS (BLADE) ×1 IMPLANT
BLADE SURG 15 STRL SS (BLADE) ×1
COVER BACK TABLE 60X90IN (DRAPES) ×2 IMPLANT
COVER MAYO STAND STRL (DRAPES) ×2 IMPLANT
DRAIN PENROSE 18X1/4 LTX STRL (WOUND CARE) IMPLANT
DRAPE LAPAROTOMY 100X72 PEDS (DRAPES) ×2 IMPLANT
ELECT REM PT RETURN 9FT ADLT (ELECTROSURGICAL) ×2
ELECTRODE REM PT RTRN 9FT ADLT (ELECTROSURGICAL) ×1 IMPLANT
GLOVE BIO SURGEON STRL SZ8 (GLOVE) ×2 IMPLANT
GOWN STRL REUS W/ TWL LRG LVL3 (GOWN DISPOSABLE) ×1 IMPLANT
GOWN STRL REUS W/ TWL XL LVL3 (GOWN DISPOSABLE) ×1 IMPLANT
GOWN STRL REUS W/TWL LRG LVL3 (GOWN DISPOSABLE) ×2
GOWN STRL REUS W/TWL XL LVL3 (GOWN DISPOSABLE) ×2
KIT ROOM TURNOVER WOR (KITS) ×2 IMPLANT
NEEDLE HYPO 22GX1.5 SAFETY (NEEDLE) ×2 IMPLANT
PACK BASIN DAY SURGERY FS (CUSTOM PROCEDURE TRAY) ×2 IMPLANT
PENCIL BUTTON HOLSTER BLD 10FT (ELECTRODE) ×2 IMPLANT
SPONGE GAUZE 4X4 12PLY STER LF (GAUZE/BANDAGES/DRESSINGS) ×2 IMPLANT
SUT CHROMIC 3 0 SH 27 (SUTURE) ×4 IMPLANT
SUT CHROMIC 4 0 RB 1X27 (SUTURE) IMPLANT
SUT CHROMIC 5 0 RB 1 27 (SUTURE) ×2 IMPLANT
SYR CONTROL 10ML LL (SYRINGE) ×2 IMPLANT
TOWEL OR 17X24 6PK STRL BLUE (TOWEL DISPOSABLE) ×4 IMPLANT
TRAY DSU PREP LF (CUSTOM PROCEDURE TRAY) ×2 IMPLANT

## 2016-03-26 NOTE — Anesthesia Preprocedure Evaluation (Signed)
Anesthesia Evaluation  Patient identified by MRN, date of birth, ID band Patient awake    Reviewed: Allergy & Precautions, NPO status , Patient's Chart, lab work & pertinent test results  History of Anesthesia Complications (+) PONV  Airway Mallampati: II   Neck ROM: full    Dental   Pulmonary sleep apnea ,    breath sounds clear to auscultation       Cardiovascular hypertension, + CAD, + Past MI and + Cardiac Stents   Rhythm:regular Rate:Normal     Neuro/Psych TIA   GI/Hepatic GERD  ,  Endo/Other  diabetes, Type 2  Renal/GU      Musculoskeletal  (+) Arthritis ,   Abdominal   Peds  Hematology   Anesthesia Other Findings   Reproductive/Obstetrics                             Anesthesia Physical Anesthesia Plan  ASA: III  Anesthesia Plan: General   Post-op Pain Management:    Induction: Intravenous  Airway Management Planned: LMA  Additional Equipment:   Intra-op Plan:   Post-operative Plan:   Informed Consent: I have reviewed the patients History and Physical, chart, labs and discussed the procedure including the risks, benefits and alternatives for the proposed anesthesia with the patient or authorized representative who has indicated his/her understanding and acceptance.     Plan Discussed with: CRNA, Anesthesiologist and Surgeon  Anesthesia Plan Comments:         Anesthesia Quick Evaluation

## 2016-03-26 NOTE — Transfer of Care (Signed)
Last Vitals:  Vitals:   03/26/16 0837 03/26/16 1058  BP: (!) 150/73 (!) 142/78  Pulse: 62 73  Resp: 16 14  Temp: 36.4 C 36.6 C    Last Pain:  Vitals:   03/26/16 0837  TempSrc: Oral      Patients Stated Pain Goal: 7 (03/26/16 ID:[id number])  Immediate Anesthesia Transfer of Care Note  Patient: Jose Duarte  Procedure(s) Performed: Procedure(s) (LRB): CIRCUMCISION ADULT (N/A)  Patient Location: PACU  Anesthesia Type: General  Level of Consciousness: awake, alert  and oriented  Airway & Oxygen Therapy: Patient Spontanous Breathing and Patient connected to face mask oxygen  Post-op Assessment: Report given to PACU RN and Post -op Vital signs reviewed and stable  Post vital signs: Reviewed and stable  Complications: No apparent anesthesia complications

## 2016-03-26 NOTE — Op Note (Addendum)
PATIENT:  Jose Duarte  PRE-OPERATIVE DIAGNOSIS: Phimosis  POST-OPERATIVE DIAGNOSIS: Same  PROCEDURE: Circumcision  SURGEON:  Claybon Jabs  INDICATION: Jose Duarte is a 75 year old male with progressive phimosis and has become symptomatic. He is elected to proceed with surgical correction.  ANESTHESIA:  General  EBL:  Minimal  DRAINS: None  LOCAL MEDICATIONS USED:  10 mL of 0.5% plain Marcaine as a dorsal penile block  SPECIMEN:  None  Description of procedure: After informed consent the patient was taken to the operating room and placed on the table in a supine position. General anesthesia was then administered. Once fully anesthetized the genitalia were sterilely prepped and draped in standard fashion. An official timeout was then performed.  I performed a dorsal penile block in the standard fashion.  A circumferential circumcising incision was made approximately 5 mm from the corona. The foreskin was replaced in its normal anatomic position and at the location of the corona was noted and a second incision on the shaft skin was made circumferentially following the corona. The excess foreskin was then excised sharply and bleeding points were controlled with electrocautery.  A frenular U stitch was placed at the 6:00 position using a 3-0 chromic suture and a second 3-0 chromic was placed at the 12:00 position. I then reapproximated the skin edges in a running fashion with the 3-0 chromic that had been previously placed. Neosporin, a loose gauze dressing and Coban and were applied and the patient was awakened and taken to the recovery room in stable and satisfactory condition. He tolerated procedure well no intraoperative complications.  PLAN OF CARE: Discharge to home after PACU  PATIENT DISPOSITION:  PACU - hemodynamically stable.

## 2016-03-26 NOTE — Progress Notes (Signed)
Dr Karsten Ro called back. Stated that he did go to waiting room and tried to find familly.

## 2016-03-26 NOTE — Anesthesia Procedure Notes (Signed)
Procedure Name: LMA Insertion Date/Time: 03/26/2016 10:12 AM Performed by: Marcie Bal, ADAM Pre-anesthesia Checklist: Patient identified, Emergency Drugs available, Suction available and Patient being monitored Patient Re-evaluated:Patient Re-evaluated prior to inductionOxygen Delivery Method: Circle system utilized Preoxygenation: Pre-oxygenation with 100% oxygen Intubation Type: IV induction Ventilation: Mask ventilation without difficulty LMA: LMA inserted LMA Size: 4.0 Number of attempts: 1 Airway Equipment and Method: Bite block Placement Confirmation: positive ETCO2 Tube secured with: Tape Dental Injury: Teeth and Oropharynx as per pre-operative assessment

## 2016-03-29 ENCOUNTER — Encounter (HOSPITAL_BASED_OUTPATIENT_CLINIC_OR_DEPARTMENT_OTHER): Payer: Self-pay | Admitting: Urology

## 2016-03-29 NOTE — Anesthesia Postprocedure Evaluation (Signed)
Anesthesia Post Note  Patient: Jose Duarte  Procedure(s) Performed: Procedure(s) (LRB): CIRCUMCISION ADULT (N/A)  Patient location during evaluation: PACU Anesthesia Type: General Level of consciousness: awake and alert and patient cooperative Pain management: pain level controlled Vital Signs Assessment: post-procedure vital signs reviewed and stable Respiratory status: spontaneous breathing and respiratory function stable Cardiovascular status: stable Anesthetic complications: no    Last Vitals:  Vitals:   03/26/16 1145 03/26/16 1300  BP: 137/85 134/62  Pulse: 65 68  Resp: 16 16  Temp:  36.5 C    Last Pain:  Vitals:   03/26/16 0837  TempSrc: Oral                 Ronna Herskowitz S

## 2016-05-27 ENCOUNTER — Other Ambulatory Visit: Payer: Self-pay | Admitting: Nurse Practitioner

## 2016-08-31 ENCOUNTER — Other Ambulatory Visit: Payer: Self-pay | Admitting: Cardiovascular Disease

## 2016-09-01 ENCOUNTER — Other Ambulatory Visit: Payer: Self-pay | Admitting: Cardiovascular Disease

## 2016-09-14 NOTE — Progress Notes (Signed)
Patient ID: Jose Duarte, male   DOB: 01/06/1941, 76 y.o.   MRN: [id number]   75 y.o.  self referral. Previously seen by [name]. Did not like [facility] practice. Wanted to change to [place]. On 03/01/13 He was taken acutely to the cath lab which revealed a totally occluded ramus branch. This was stented with a 3.0x16 mm Promus Premier DES stent postdilated 3.2 mm with 100% occlusion reduced to 0% and resumption of TIMI 3 flow.   03/06/14  ETT normal HTN response and PVCls BP meds adjusted 03/24/15 Myovue old scar no ischemia EF 52%  Medical Rx recommended   Nuclear stress EF: 52%.  There was no ST segment deviation noted during stress.  Defect 1: There is a large defect of moderate severity present in the basal inferolateral, basal anterolateral, mid inferolateral and mid anterolateral location. The patient has had a known lateral wall MI. There is no ischemia. His LV function is at the lower end of normal .  This is a low risk study.  Echo 03/24/15  EF a bit better than immediate post PCI/Stent  Study Conclusions  - Left ventricle: There is basal and mid inferior and inferolateral  hypokinesis. The cavity size was mildly dilated. There was mild  focal basal hypertrophy of the septum. Systolic function was  mildly reduced. The estimated ejection fraction was in the range  of 45% to 50%. Doppler parameters are consistent with abnormal  left ventricular relaxation (grade 1 diastolic dysfunction).  There was no evidence of elevated ventricular filling pressure by  Doppler parameters. - Aortic valve: Trileaflet; mildly thickened, mildly calcified  leaflets. Sclerosis without stenosis. There was mild  regurgitation. - Aortic root: The aortic root was normal in size. - Mitral valve: Structurally normal valve. There was mild  regurgitation directed centrally. - Left atrium: The atrium was mildly dilated. - Right ventricle: Systolic function was normal. - Right atrium: The atrium  was normal in size. - Tricuspid valve: There was mild regurgitation. - Pulmonary arteries: Systolic pressure was within the normal  range. - Inferior vena cava: The vessel was normal in size. The  respirophasic diameter changes were in the normal range (= 50%),  consistent with normal central venous pressure.  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  All other systems reviewed and negative  General: Affect appropriate Healthy:  appears stated age 60: normal Neck supple with no adenopathy JVP normal no bruits no thyromegaly Lungs clear with no wheezing and good diaphragmatic motion Heart:  S1/S2 no murmur, no rub, gallop or click PMI normal Abdomen: benighn, BS positve, no tenderness, no AAA recent left inguinal hernia repair  no bruit.  No HSM or HJR Distal pulses intact with no bruits No edema Neuro non-focal Skin warm and dry No muscular weakness   Current Outpatient Prescriptions  Medication Sig Dispense Refill  . aspirin 325 MG tablet Take 325 mg by mouth daily.    . fluticasone (FLONASE) 50 MCG/ACT nasal spray Place 2 sprays into the nose daily as needed for allergies.    Marland Kitchen ibuprofen (ADVIL,MOTRIN) 200 MG tablet Take 200 mg by mouth every 6 (six) hours as needed.    . Melatonin 5 MG  TABS Take 5 mg by mouth at bedtime as needed.    . metFORMIN (GLUCOPHAGE) 1000 MG tablet Take 1,000 mg by mouth 2 (two) times daily.     Marland Kitchen NITROSTAT 0.4 MG SL tablet DISSOLVE ONE TABLET UNDER THE TONGUE EVERY 5 MINUTES AS NEEDED FOR CHEST PAIN.  DO NOT EXCEED A TOTAL OF 3 DOSES IN 15 MINUTES 25 tablet 3  . pantoprazole (PROTONIX) 40 MG tablet Take 40 mg  by mouth daily.    . rosuvastatin (CRESTOR) 10 MG tablet TAKE ONE TABLET BY MOUTH ONCE DAILY 90 tablet 3  . losartan (COZAAR) 50 MG tablet Take 1 tablet (50 mg total) by mouth daily. 90 tablet 3   No current facility-administered medications for this visit.     Allergies  Penicillins  Electrocardiogram:  SB rate 51 PVC  11/15  03/13/15  Sr rate 61 LAD ICRBBB 09/17/16 SR RBBB rate 58  Assessment and Plan CAD:  Stent to ramus in 2014 no ischemia on myovue 03/2014 medical Rx  HTN:  On ARB given low EF stable DCM:  Ischemic EF 45-50% echo 03/24/2015 and 52% myovue same day  Chol:  Labs from Dr Arelia Sneddon reviewed. Continue crestor  New script for 10 mg called in ( he will take 1/2 tablet but save money this way) DM:  A1c 6.2 Dr Welton Flakes follows continue oral agents and low carb diet Prostate:  F/u urology consider changing flomax to proscar to avoid headaches  Neuropathy:  From DM affecting feet discuss with primary use of neurontin  F/U with me in a year   [employer]

## 2016-09-17 ENCOUNTER — Ambulatory Visit (INDEPENDENT_AMBULATORY_CARE_PROVIDER_SITE_OTHER): Payer: Medicare HMO | Admitting: Cardiovascular Disease

## 2016-09-17 ENCOUNTER — Encounter: Payer: Self-pay | Admitting: Cardiovascular Disease

## 2016-09-17 VITALS — BP 130/70 | HR 61 | Ht 71.0 in | Wt 193.8 lb

## 2016-09-17 DIAGNOSIS — I10 Essential (primary) hypertension: Secondary | ICD-10-CM

## 2016-09-17 DIAGNOSIS — I2510 Atherosclerotic heart disease of native coronary artery without angina pectoris: Secondary | ICD-10-CM

## 2016-09-17 DIAGNOSIS — Z9861 Coronary angioplasty status: Secondary | ICD-10-CM | POA: Diagnosis not present

## 2016-09-17 MED ORDER — LOSARTAN POTASSIUM 50 MG PO TABS: 50.0000 mg | ORAL_TABLET | Freq: Every day | ORAL | 3 refills | Status: DC

## 2016-09-17 NOTE — Patient Instructions (Addendum)

## 2017-02-10 ENCOUNTER — Telehealth: Payer: Self-pay

## 2017-02-10 NOTE — Telephone Encounter (Signed)
Request for surgical clearance:  1. What type of surgery is being performed? Cataract extraction with intraocular lens implantation of the right eye followed by the left eye with topical anesthesia.   2. When is this surgery scheduled? 03/21/17  3. Are there any medications that need to be held prior to surgery and how long? NONE  4. Name of physician performing surgery? Dr. Talbert Forest  5. What is your office phone and fax number?  Phone number [phone] and Fax number [phone]

## 2017-02-10 NOTE — Telephone Encounter (Signed)
Ok to have cataract surgery

## 2017-02-10 NOTE — Telephone Encounter (Signed)
Will fax to number provided.  

## 2017-03-05 DIAGNOSIS — I10 Essential (primary) hypertension: Secondary | ICD-10-CM

## 2017-03-05 DIAGNOSIS — I6789 Other cerebrovascular disease: Secondary | ICD-10-CM | POA: Diagnosis not present

## 2017-03-05 DIAGNOSIS — E119 Type 2 diabetes mellitus without complications: Secondary | ICD-10-CM

## 2017-03-05 DIAGNOSIS — I639 Cerebral infarction, unspecified: Secondary | ICD-10-CM

## 2017-03-05 DIAGNOSIS — Z955 Presence of coronary angioplasty implant and graft: Secondary | ICD-10-CM

## 2017-03-05 LAB — PROTIME-INR

## 2017-03-10 ENCOUNTER — Telehealth: Payer: Self-pay | Admitting: Cardiovascular Disease

## 2017-03-10 NOTE — Telephone Encounter (Signed)
New message     Patient has appt  10/1 but wants to speak with nurse prior to cataract   Surgery.

## 2017-03-10 NOTE — Telephone Encounter (Signed)
Patient was needing a sooner appt with Dr. Johnsie Cancel, due to recent stroke and visit to [facility]. Patient needs appt sooner due to his cataract surgery being on 10/1. Made an appt with patient on Monday. Will send message to chart prep to get records of patient's stay at [facility]. Patient agreed to plan.

## 2017-03-11 NOTE — Progress Notes (Addendum)
Patient ID: Jose Duarte, male   DOB: 1940-12-01, 76 y.o.   MRN: [id number]   76 y.o.  self referral. Previously seen by [name]. Did not like [facility] practice. Wanted to change to [place]. On 03/01/13 He was taken acutely to the cath lab which revealed a totally occluded ramus branch. This was stented with a 3.0x16 mm Promus Premier DES stent postdilated 3.2 mm with 100% occlusion reduced to 0% and resumption of TIMI 3 flow.   03/06/14  ETT normal HTN response and PVCls BP meds adjusted 03/24/15 Myovue old scar no ischemia EF 52%  Medical Rx recommended   Nuclear stress EF: 52%.  There was no ST segment deviation noted during stress.  Defect 1: There is a large defect of moderate severity present in the basal inferolateral, basal anterolateral, mid inferolateral and mid anterolateral location. The patient has had a known lateral wall MI. There is no ischemia. His LV function is at the lower end of normal .  This is a low risk study.  Echo 03/24/15  EF a bit better than immediate post PCI/Stent  Study Conclusions  - Left ventricle: There is basal and mid inferior and inferolateral  hypokinesis. The cavity size was mildly dilated. There was mild  focal basal hypertrophy of the septum. Systolic function was  mildly reduced. The estimated ejection fraction was in the range  of 45% to 50%. Doppler parameters are consistent with abnormal  left ventricular relaxation (grade 1 diastolic dysfunction).  There was no evidence of elevated ventricular filling pressure by  Doppler parameters. - Aortic valve: Trileaflet; mildly thickened, mildly calcified  leaflets. Sclerosis without stenosis. There was mild  regurgitation. - Aortic root: The aortic root was normal in size. - Mitral valve: Structurally normal valve. There was mild  regurgitation directed centrally. - Left atrium: The atrium was mildly dilated. - Right ventricle: Systolic function was normal. - Right atrium: The atrium  was normal in size. - Tricuspid valve: There was mild regurgitation. - Pulmonary arteries: Systolic pressure was within the normal  range. - Inferior vena cava: The vessel was normal in size. The  respirophasic diameter changes were in the normal range (= 50%),  consistent with normal central venous pressure.  Had left inguinal surgery 11/15 and did well   Flomax causing headache Needs to f/u with Dr [name] by balance issues and neuropathy in feet  Interval History:  CVA diagnosed at [facility]:  Reviewed notes 03/05/17 Transient LUE weakness MRI with 2 small foci of acute ischemia in right precentral gyrus. Echo no embolic source. Telemetry with no afib. Carotids with no hemodynamically significant stenosis. A1c 7.1 and LDL 122. Has had previous CVA requiring tPa. D/c home on Plavix Also d/c on much higher dose crestor 20 mg had been on 5 mg and legs hurting now   ROS: Denies fever, malais, weight loss, blurry vision, decreased visual acuity, cough, sputum, SOB, hemoptysis, pleuritic pain, palpitaitons, heartburn, abdominal pain, melena, lower extremity edema, claudication, or rash.  All other systems reviewed and negative  General: BP (!) 150/86   Pulse (!) 59   Ht 5\' 11"  (1.803 m)   Wt 192 lb 12.8 oz (87.5 kg)   SpO2 98%   BMI 26.89 kg/m  Affect appropriate Healthy:  appears stated age 61: normal Neck supple with no adenopathy JVP normal no bruits no thyromegaly Lungs clear with no wheezing and good diaphragmatic motion Heart:  S1/S2 no murmur, no rub, gallop or click PMI  normal Abdomen: benighn, BS positve, no tenderness, no AAA no bruit.  No HSM or HJR Distal pulses intact with no bruits No edema Neuro non-focal Skin warm and dry No muscular weakness    Current Outpatient Prescriptions  Medication Sig Dispense Refill  . clopidogrel (PLAVIX) 75 MG tablet Take 75 mg by mouth daily.    . fluticasone (FLONASE) 50 MCG/ACT nasal spray Place 2  sprays into the nose daily as needed for allergies.    Marland Kitchen losartan (COZAAR) 50 MG tablet Take 1 tablet (50 mg total) by mouth daily. 90 tablet 3  . Melatonin 5 MG TABS Take 5 mg by mouth at bedtime as needed.    . metFORMIN (GLUCOPHAGE) 1000 MG tablet Take 1,000 mg by mouth 2 (two) times daily.     Marland Kitchen NITROSTAT 0.4 MG SL tablet DISSOLVE ONE TABLET UNDER THE TONGUE EVERY 5 MINUTES AS NEEDED FOR CHEST PAIN.  DO NOT EXCEED A TOTAL OF 3 DOSES IN 15 MINUTES 25 tablet 3  . pantoprazole (PROTONIX) 40 MG tablet Take 40 mg by mouth daily.    . rosuvastatin (CRESTOR) 10 MG tablet Take 1 tablet (10 mg total) by mouth daily. 90 tablet 3   No current facility-administered medications for this visit.     Allergies  Penicillins  Electrocardiogram:  SB rate 51 PVC  11/15  03/13/15  Sr rate 61 LAD ICRBBB 09/17/16 SR RBBB rate 58 03/08/17 SR RBBB no acute changes 03/14/17 SR rate 59 RBBB   Assessment and Plan CAD:  Stent to ramus in 2014 no ischemia on myovue 03/2014 medical Rx  HTN:  On ARB given low EF stable DCM:  Ischemic EF 45-50% echo 03/24/2015 and 52% myovue same day  Chol:  Cut back to 10 mg due to myalgias and check labs in 3 months  DM :  Discussed low carb diet.  Target hemoglobin A1c is 6.5 or less.  Continue current medications. Prostate:  F/u urology consider changing flomax to proscar to avoid headaches  Neuropathy:  From DM affecting feet discuss with primary use of neurontin Stroke: has f/u with [name] refer to EP for Linq device r/o PAF continue plavix  Cataract:  Ok to have surgery Monday on plavix     [employer]

## 2017-03-14 ENCOUNTER — Encounter: Payer: Self-pay | Admitting: Cardiovascular Disease

## 2017-03-14 ENCOUNTER — Ambulatory Visit (INDEPENDENT_AMBULATORY_CARE_PROVIDER_SITE_OTHER): Payer: Medicare HMO | Admitting: Cardiovascular Disease

## 2017-03-14 VITALS — BP 150/86 | HR 59 | Ht 71.0 in | Wt 192.8 lb

## 2017-03-14 DIAGNOSIS — Z9861 Coronary angioplasty status: Secondary | ICD-10-CM

## 2017-03-14 DIAGNOSIS — E785 Hyperlipidemia, unspecified: Secondary | ICD-10-CM

## 2017-03-14 DIAGNOSIS — I2510 Atherosclerotic heart disease of native coronary artery without angina pectoris: Secondary | ICD-10-CM | POA: Diagnosis not present

## 2017-03-14 MED ORDER — ROSUVASTATIN CALCIUM 10 MG PO TABS: 10.0000 mg | ORAL_TABLET | Freq: Every day | ORAL | 3 refills | Status: DC

## 2017-03-14 NOTE — Patient Instructions (Addendum)
Medication Instructions:  Your physician has recommended you make the following change in your medication:  1. Decrease Crestor 10 mg by mouth daily.   Labwork: Your physician recommends that you return for lab work in: 3 months fasting Lipid and Liver panel   Testing/Procedures: NONE  Follow-Up: Your physician wants you to follow-up in: 3 months with Dr. Johnsie Cancel.  You have been referred to new patient for EP. Follow-up as soon as possible to rule out PAF due to stroke.   If you need a refill on your cardiac medications before your next appointment, please call your pharmacy.

## 2017-03-15 ENCOUNTER — Ambulatory Visit (INDEPENDENT_AMBULATORY_CARE_PROVIDER_SITE_OTHER): Payer: Medicare HMO | Admitting: Internal Medicine

## 2017-03-15 ENCOUNTER — Encounter: Payer: Self-pay | Admitting: Internal Medicine

## 2017-03-15 VITALS — BP 132/72 | HR 69 | Ht 71.0 in | Wt 192.0 lb

## 2017-03-15 DIAGNOSIS — I639 Cerebral infarction, unspecified: Secondary | ICD-10-CM

## 2017-03-15 NOTE — Patient Instructions (Signed)
Medication Instructions: - Your physician recommends that you continue on your current medications as directed. Please refer to the Current Medication list given to you today.  Labwork: - none ordered  Procedures/Testing: - Your physician has recommended that you have a loop recorder (LINQ) implanted  Please arrive at the [facility], Entrance "A" of [facility] on Thursday 03/17/17 at 6:30 am - this is located on the [facility] side of the hospital, once you enter, proceed straight down the hall way to Admitting to register, this will be on your left - You may have a light breakfast the morning of your procedure - you may take all of your regular medications the morning of your procedure - please wash your chest and neck area with an antibacterial soap the night before and morning of your procedure.   Follow-Up: - Your physician recommends that you schedule a follow-up appointment in: 10-14 days (from 03/17/17) with the [facility] for a wound check appointment.    Any Additional Special Instructions Will Be Listed Below (If Applicable).     If you need a refill on your cardiac medications before your next appointment, please call your pharmacy.

## 2017-03-15 NOTE — Progress Notes (Signed)
ELECTROPHYSIOLOGY CONSULT NOTE  Patient ID: Jose Duarte, MRN: [id number], DOB/AGE: October 07, 1940 76 y.o. Admit date: (Not on file) Date of Consult: 03/15/2017  Primary Physician: Leonard Downing, MD Primary Cardiologist: Raynaldo Opitz     Jose Duarte is a 76 y.o. male who is being seen today for the evaluation of ILR at the request of [facility].    HPI Jose Duarte is a 76 y.o. male  Referred for consideration of a loop recorder.  Seen at [facility] for stroke 9/18. No embolic source seen on echo. Thought to be ischemic. Telemetry without atrial fibrillation. Prior history of more remote stroke treated with TPA about 10 years ago currently on aspirin and Plavix. There has been interval stroke as well.  No history of atrial fibrillation or palpitations.  CAD with History of stenting acutely of an occluded ramus 9/14. No interval issues with chest pain. No shortness of breath.  She's had issues with balance over the last year or so.  DATE TEST    10/16    Myoview   EF 52 % Old lateral wall MI   10/16    Echo   EF 45-50 %          Thromboembolic risk factors ( age - -2, HTN-1, TIA/CVA-2, DM-1, Vasc disease -1) for a CHADSVASc Score of 7   Past Medical History:  Diagnosis Date  . Arthritis   . CAD S/P percutaneous coronary angioplasty - PCI RCA cardiologist-  dr Johnsie Cancel   03-01-2013 Promus Premier DES 3.0 mm x 16 mm - post-dilated to 3.2 mm - Dr. Claiborne Billings   . Cataract   . Diverticulosis of colon   . Family history of adverse reaction to anesthesia    brother --  ponv  . GERD (gastroesophageal reflux disease)   . History of adenomatous polyp of colon   . History of CVA (cerebrovascular accident)    01-06-2009  right posterior temporal and occipital lobe  . History of nonmelanoma skin cancer    EXCISION OF EAR  . History of ST elevation myocardial infarction (STEMI) 03/01/2013   inferolateral s/p  DES to RI  . History of transient ischemic attack (TIA)    2006  approx  . HTN (hypertension), benign 03/02/2013  . Hyperlipidemia   . Hypertension   . Incomplete right bundle branch block   . Ischemic cardiomyopathy    ef 45-50% per last echo 2016 and myview stress ef 52% 2016  . Myocardial infarction ([place])   . Nocturia   . OSA (obstructive sleep apnea)    study done  approx 2006--  no cpap  . Peripheral neuropathy   . Phimosis   . PONV (postoperative nausea and vomiting)   . S/P drug eluting coronary stent placement 03/01/2013   x1 to Ramus  . Stroke [facility])    2 "mini" strokes, no residual  . Type 2 diabetes mellitus ([place])   . Wears glasses       Surgical History:  Past Surgical History:  Procedure Laterality Date  . CARDIAC CATHETERIZATION  09-18-2004   [facility]   no significant cad, normal LVF, ef 60%  . CARDIOVASCULAR STRESS TEST  03-24-2015   dr Johnsie Cancel   Low risk nuclear study w/ no ischemia;  (previous known lateral wall MI) non-reversible large moderate severity defect in the basal and mid inferolateral , basal and mid anterolateral ;  nuclear stress ef 52%  . CERVICAL SPINE SURGERY  1980's  . CIRCUMCISION  N/A 03/26/2016   Procedure: CIRCUMCISION ADULT;  Surgeon: Kathie Rhodes, MD;  Location[facility];  Service: Urology;  Laterality: N/A;  . COLON SURGERY    . DIAGNOSTIC LAPAROSCOPY    . EXPLORATORY LAPAROTOMY/  RIGHT COLECTOMY/  DRAINAGE INTRA-ABDOMINAL SUBPHRENIC ABSCESS AND EXCISION INFLAMMATORY MESENTERY ABSCESS WITHIN TRANSVERSE COLON  09-01-2009   dr Rise Patience  . HERNIA REPAIR  1970's  . INGUINAL HERNIA REPAIR Left 09/18/2012   Procedure: LAPAROSCOPIC INGUINAL HERNIA;  Surgeon: Ralene Ok, MD;  Location: [facility];  Service: General;  Laterality: Left;  . INGUINAL HERNIA REPAIR Left 07/03/2014   Procedure: LEFT INGUINAL HERNIA REPAIR WITH MESH;  Surgeon: Pedro Earls, MD;  Location: [facility];  Service: General;  Laterality: Left;  . INSERTION OF MESH Left 09/18/2012   Procedure: INSERTION OF MESH;  Surgeon:  Ralene Ok, MD;  Location: [facility];  Service: General;  Laterality: Left;  . LAPAROSCOPIC CHOLECYSTECTOMY  11-22-5   dr Rosana Hoes  . LAPAROSCOPIC LYSIS OF ADHESIONS N/A 09/18/2012   Procedure: LAPAROSCOPIC LYSIS OF ADHESIONS;  Surgeon: Ralene Ok, MD;  Location: [facility];  Service: General;  Laterality: N/A;  . LEFT HEART CATHETERIZATION WITH CORONARY ANGIOGRAM N/A 03/01/2013   Procedure: LEFT HEART CATHETERIZATION WITH CORONARY ANGIOGRAM;  Surgeon: Troy Sine, MD;  Location: [facility] CATH LAB;  Service: Cardiovascular;  Laterality: N/A;  PTCA, thrombectomy, and DES stent to right proximal ramus intermediate;  diffuse 30-35% RCA,  diffuse 50% LAD;  preserved LVF w/ moderate anterolateral hypocontractility  . TRANSTHORACIC ECHOCARDIOGRAM  03/24/2015   basal, mid inferor and inferolateral hypokinesis, mild focal basal LVH, ef 71-06%, grade 1 diastolic dysfunction/  mild AV sclerosis without stenosis/  mild AR, MR, and TR/  mild LAE  . VENTRAL HERNIA REPAIR N/A 09/18/2012   Procedure: LAPAROSCOPIC VENTRAL HERNIA;  Surgeon: Ralene Ok, MD;  Location: [facility];  Service: General;  Laterality: N/A;     Home Meds: Prior to Admission medications   Medication Sig Start Date End Date Taking? Authorizing Provider  clopidogrel (PLAVIX) 75 MG tablet Take 75 mg by mouth daily. 03/06/17   [provider]  fluticasone (FLONASE) 50 MCG/ACT nasal spray Place 2 sprays into the nose daily as needed for allergies. 09/26/15   [provider]  losartan (COZAAR) 50 MG tablet Take 1 tablet (50 mg total) by mouth daily. 09/17/16   Josue Hector, MD  Melatonin 5 MG TABS Take 5 mg by mouth at bedtime as needed.    [provider]  metFORMIN (GLUCOPHAGE) 1000 MG tablet Take 1,000 mg by mouth 2 (two) times daily.  01/14/15   [provider]  NITROSTAT 0.4 MG SL tablet DISSOLVE ONE TABLET UNDER THE TONGUE EVERY 5 MINUTES AS NEEDED FOR CHEST PAIN.  DO NOT EXCEED A TOTAL OF 3 DOSES IN 15  MINUTES 08/11/15   Josue Hector, MD  pantoprazole (PROTONIX) 40 MG tablet Take 40 mg by mouth daily. 01/15/15   [provider]  rosuvastatin (CRESTOR) 10 MG tablet Take 1 tablet (10 mg total) by mouth daily. 03/14/17   Josue Hector, MD    Allergies:  Allergies  Allergen Reactions  . Penicillins Anaphylaxis    Knots on head; tongue swelling    Social History   Social History  . Marital status: Married    Spouse name: N/A  . Number of children: N/A  . Years of education: N/A   Occupational History  . Not on file.   Social History Main  Topics  . Smoking status: Never Smoker  . Smokeless tobacco: Former Systems developer    Types: Chew     Comment: quit 2003  . Alcohol use No  . Drug use: No  . Sexual activity: Not on file   Other Topics Concern  . Not on file   Social History Narrative  . No narrative on file     Family History  Problem Relation Age of Onset  . Cancer Sister        lung     ROS:  Please see the history of present illness.     All other systems reviewed and negative.    Physical Exam: Blood pressure 132/72, pulse 69, height 5\' 11"  (1.803 m), weight 192 lb (87.1 kg), SpO2 98 %. General: Well developed, well nourished male in no acute distress. Head: Normocephalic, atraumatic, sclera non-icteric, no xanthomas, nares are without discharge. EENT: normal  Lymph Nodes:  none Neck: Negative for carotid bruits. JVD not elevated. Back:without scoliosis kyphosis  Lungs: Clear bilaterally to auscultation without wheezes, rales, or rhonchi. Breathing is unlabored. Heart: RRR with S1 S2. No  * murmur . No rubs, or gallops appreciated. Abdomen: Soft, non-tender, non-distended with normoactive bowel sounds. No hepatomegaly. No rebound/guarding. No obvious abdominal masses. Msk:  Strength and tone appear normal for age. Extremities: No clubbing or cyanosis.  1+ edema.  Distal pedal pulses are 2+ and equal bilaterally. Skin: Warm and Dry Neuro: Alert and  oriented X 3. CN III-XII intact Grossly normal sensory and motor function . Psych:  Responds to questions appropriately with a normal affect.      Labs: Cardiac Enzymes No results for input(s): CKTOTAL, CKMB, TROPONINI in the last 72 hours. CBC Lab Results  Component Value Date   WBC 10.2 07/04/2014   HGB 15.0 03/26/2016   HCT 44.0 03/26/2016   MCV 91.6 07/04/2014   PLT 190 07/04/2014   PROTIME: No results for input(s): LABPROT, INR in the last 72 hours. Chemistry No results for input(s): NA, K, CL, CO2, BUN, CREATININE, CALCIUM, PROT, BILITOT, ALKPHOS, ALT, AST, GLUCOSE in the last 168 hours.  Invalid input(s): LABALBU Lipids Lab Results  Component Value Date   CHOL 97 03/02/2013   HDL 34 (L) 03/02/2013   LDLCALC 38 03/02/2013   TRIG 123 03/02/2013   BNP Pro B Natriuretic peptide (BNP)  Date/Time Value Ref Range Status  04/05/2013 11:41 AM 78.0 0.0 - 100.0 pg/mL Final  03/01/2013 12:32 AM 74.4 0 - 125 pg/mL Final   Thyroid Function Tests: No results for input(s): TSH, T4TOTAL, T3FREE, THYROIDAB in the last 72 hours.  Invalid input(s): FREET3 Miscellaneous No results found for: DDIMER  Radiology/Studies:  No results found.  EKG: 9/24 sinus at 59 Intervals 17/16/47 Right bundle branch block   Assessment and Plan:  Strokes-recurrent  Right bundle branch block   Ischemic heart disease with prior stenting  Multiple TERF   The patient has had recurrent strokes. They're described as ischemic. It is reasonable particularly given the demographic and multiple thromboembolic risk factors which has been associated with atrial fibrillation risk to pursue   loop recorder implantation exclude atrial fibrillation.  We have discussed the issues related to Antiplatelet therapy versus anticoagulant therapy, SCAF and is diminished risk of stroke compared to atrial fibrillation, and i have elected to pursue loop recorder implantation.  With his gait disturbance,   he is  to be scheduled consultation with neurology.  He is on lipid lowering therapy  Virl Axe

## 2017-03-17 ENCOUNTER — Encounter (HOSPITAL_COMMUNITY)
Admission: RE | Disposition: A | Discharge status: Home / Self Care | Payer: Self-pay | Source: Ambulatory Visit | Attending: Internal Medicine

## 2017-03-17 ENCOUNTER — Encounter (HOSPITAL_COMMUNITY): Payer: Self-pay | Admitting: Internal Medicine

## 2017-03-17 ENCOUNTER — Ambulatory Visit (HOSPITAL_COMMUNITY)
Admission: RE | Admit: 2017-03-17 | Discharge: 2017-03-17 | Disposition: A | Discharge status: Home / Self Care | Payer: Medicare HMO | Source: Ambulatory Visit | Attending: Internal Medicine | Admitting: Internal Medicine

## 2017-03-17 DIAGNOSIS — Z7951 Long term (current) use of inhaled steroids: Secondary | ICD-10-CM | POA: Insufficient documentation

## 2017-03-17 DIAGNOSIS — I2510 Atherosclerotic heart disease of native coronary artery without angina pectoris: Secondary | ICD-10-CM | POA: Diagnosis not present

## 2017-03-17 DIAGNOSIS — R269 Unspecified abnormalities of gait and mobility: Secondary | ICD-10-CM | POA: Insufficient documentation

## 2017-03-17 DIAGNOSIS — I4510 Unspecified right bundle-branch block: Secondary | ICD-10-CM | POA: Insufficient documentation

## 2017-03-17 DIAGNOSIS — Z880 Allergy status to penicillin: Secondary | ICD-10-CM | POA: Insufficient documentation

## 2017-03-17 DIAGNOSIS — Z7984 Long term (current) use of oral hypoglycemic drugs: Secondary | ICD-10-CM | POA: Diagnosis not present

## 2017-03-17 DIAGNOSIS — Z955 Presence of coronary angioplasty implant and graft: Secondary | ICD-10-CM | POA: Diagnosis not present

## 2017-03-17 DIAGNOSIS — I255 Ischemic cardiomyopathy: Secondary | ICD-10-CM | POA: Diagnosis not present

## 2017-03-17 DIAGNOSIS — I10 Essential (primary) hypertension: Secondary | ICD-10-CM | POA: Diagnosis not present

## 2017-03-17 DIAGNOSIS — G4733 Obstructive sleep apnea (adult) (pediatric): Secondary | ICD-10-CM | POA: Diagnosis not present

## 2017-03-17 DIAGNOSIS — I638 Other cerebral infarction: Secondary | ICD-10-CM | POA: Diagnosis not present

## 2017-03-17 DIAGNOSIS — I252 Old myocardial infarction: Secondary | ICD-10-CM | POA: Insufficient documentation

## 2017-03-17 DIAGNOSIS — E1142 Type 2 diabetes mellitus with diabetic polyneuropathy: Secondary | ICD-10-CM | POA: Insufficient documentation

## 2017-03-17 DIAGNOSIS — Z7902 Long term (current) use of antithrombotics/antiplatelets: Secondary | ICD-10-CM | POA: Insufficient documentation

## 2017-03-17 DIAGNOSIS — Z7982 Long term (current) use of aspirin: Secondary | ICD-10-CM | POA: Diagnosis not present

## 2017-03-17 DIAGNOSIS — G459 Transient cerebral ischemic attack, unspecified: Secondary | ICD-10-CM | POA: Diagnosis present

## 2017-03-17 DIAGNOSIS — E785 Hyperlipidemia, unspecified: Secondary | ICD-10-CM | POA: Diagnosis not present

## 2017-03-17 DIAGNOSIS — M1990 Unspecified osteoarthritis, unspecified site: Secondary | ICD-10-CM | POA: Diagnosis not present

## 2017-03-17 DIAGNOSIS — K219 Gastro-esophageal reflux disease without esophagitis: Secondary | ICD-10-CM | POA: Insufficient documentation

## 2017-03-17 DIAGNOSIS — Z87891 Personal history of nicotine dependence: Secondary | ICD-10-CM | POA: Diagnosis not present

## 2017-03-17 HISTORY — PX: LOOP RECORDER INSERTION: EP1214

## 2017-03-17 SURGERY — LOOP RECORDER INSERTION

## 2017-03-17 MED ORDER — LIDOCAINE-EPINEPHRINE 1 %-1:100000 IJ SOLN
INTRAMUSCULAR | Status: DC | PRN
  Administered 2017-03-17 (×2): 5 mL

## 2017-03-17 SURGICAL SUPPLY — 2 items
LOOP REVEAL LINQSYS (Prosthesis & Implant Heart) ×2 IMPLANT
PACK LOOP INSERTION (CUSTOM PROCEDURE TRAY) ×2 IMPLANT

## 2017-03-17 NOTE — Interval H&P Note (Signed)
History and Physical Interval Note:  03/17/2017 7:45 AM  Jose Duarte  has presented today for surgery, with the diagnosis of stroke  The various methods of treatment have been discussed with the patient and family. After consideration of risks, benefits and other options for treatment, the patient has consented to  Procedure(s): LOOP RECORDER INSERTION (N/A) as a surgical intervention .  The patient's history has been reviewed, patient examined, no change in status, stable for surgery.  I have reviewed the patient's chart and labs.  Questions were answered to the patient's satisfaction.     Virl Axe No interval change

## 2017-03-17 NOTE — H&P (View-Only) (Signed)
ELECTROPHYSIOLOGY CONSULT NOTE  Patient ID: Jose Duarte, MRN: [id number], DOB/AGE: [birth year] 76 y.o. Admit date: (Not on file) Date of Consult: 03/15/2017  Primary Physician: Leonard Downing, MD Primary Cardiologist: Raynaldo Opitz     Jose Duarte is a 75 y.o. male who is being seen today for the evaluation of ILR at the request of [facility].    HPI Jose Duarte is a 76 y.o. male  Referred for consideration of a loop recorder.  Seen at [facility] for stroke 9/18. No embolic source seen on echo. Thought to be ischemic. Telemetry without atrial fibrillation. Prior history of more remote stroke treated with TPA about 10 years ago currently on aspirin and Plavix. There has been interval stroke as well.  No history of atrial fibrillation or palpitations.  CAD with History of stenting acutely of an occluded ramus 9/14. No interval issues with chest pain. No shortness of breath.  She's had issues with balance over the last year or so.  DATE TEST    10/16    Myoview   EF 52 % Old lateral wall MI   10/16    Echo   EF 45-50 %          Thromboembolic risk factors ( age - -2, HTN-1, TIA/CVA-2, DM-1, Vasc disease -1) for a CHADSVASc Score of 7   Past Medical History:  Diagnosis Date  . Arthritis   . CAD S/P percutaneous coronary angioplasty - PCI RCA cardiologist-  dr Johnsie Cancel   03-01-2013 Promus Premier DES 3.0 mm x 16 mm - post-dilated to 3.2 mm - Dr. Claiborne Billings   . Cataract   . Diverticulosis of colon   . Family history of adverse reaction to anesthesia    brother --  ponv  . GERD (gastroesophageal reflux disease)   . History of adenomatous polyp of colon   . History of CVA (cerebrovascular accident)    01-06-2009  right posterior temporal and occipital lobe  . History of nonmelanoma skin cancer    EXCISION OF EAR  . History of ST elevation myocardial infarction (STEMI) 03/01/2013   inferolateral s/p  DES to RI  . History of transient ischemic attack (TIA)    2006  approx  . HTN (hypertension), benign 03/02/2013  . Hyperlipidemia   . Hypertension   . Incomplete right bundle branch block   . Ischemic cardiomyopathy    ef 45-50% per last echo 2016 and myview stress ef 52% 2016  . Myocardial infarction ([place])   . Nocturia   . OSA (obstructive sleep apnea)    study done  approx 2006--  no cpap  . Peripheral neuropathy   . Phimosis   . PONV (postoperative nausea and vomiting)   . S/P drug eluting coronary stent placement 03/01/2013   x1 to Ramus  . Stroke [facility])    2 "mini" strokes, no residual  . Type 2 diabetes mellitus ([place])   . Wears glasses       Surgical History:  Past Surgical History:  Procedure Laterality Date  . CARDIAC CATHETERIZATION  09-18-2004   [facility]   no significant cad, normal LVF, ef 60%  . CARDIOVASCULAR STRESS TEST  03-24-2015   dr Johnsie Cancel   Low risk nuclear study w/ no ischemia;  (previous known lateral wall MI) non-reversible large moderate severity defect in the basal and mid inferolateral , basal and mid anterolateral ;  nuclear stress ef 52%  . CERVICAL SPINE SURGERY  1980's  . CIRCUMCISION  N/A 03/26/2016   Procedure: CIRCUMCISION ADULT;  Surgeon: Kathie Rhodes, MD;  Location: [facility];  Service: Urology;  Laterality: N/A;  . COLON SURGERY    . DIAGNOSTIC LAPAROSCOPY    . EXPLORATORY LAPAROTOMY/  RIGHT COLECTOMY/  DRAINAGE INTRA-ABDOMINAL SUBPHRENIC ABSCESS AND EXCISION INFLAMMATORY MESENTERY ABSCESS WITHIN TRANSVERSE COLON  09-01-2009   dr Rise Patience  . HERNIA REPAIR  1970's  . INGUINAL HERNIA REPAIR Left 09/18/2012   Procedure: LAPAROSCOPIC INGUINAL HERNIA;  Surgeon: Ralene Ok, MD;  Location: [facility];  Service: General;  Laterality: Left;  . INGUINAL HERNIA REPAIR Left 07/03/2014   Procedure: LEFT INGUINAL HERNIA REPAIR WITH MESH;  Surgeon: Pedro Earls, MD;  Location: [facility];  Service: General;  Laterality: Left;  . INSERTION OF MESH Left 09/18/2012   Procedure: INSERTION OF MESH;  Surgeon:  Ralene Ok, MD;  Location: [facility];  Service: General;  Laterality: Left;  . LAPAROSCOPIC CHOLECYSTECTOMY  11-22-5   dr Rosana Hoes  . LAPAROSCOPIC LYSIS OF ADHESIONS N/A 09/18/2012   Procedure: LAPAROSCOPIC LYSIS OF ADHESIONS;  Surgeon: Ralene Ok, MD;  Location: [facility];  Service: General;  Laterality: N/A;  . LEFT HEART CATHETERIZATION WITH CORONARY ANGIOGRAM N/A 03/01/2013   Procedure: LEFT HEART CATHETERIZATION WITH CORONARY ANGIOGRAM;  Surgeon: Troy Sine, MD;  Location: [facility] CATH LAB;  Service: Cardiovascular;  Laterality: N/A;  PTCA, thrombectomy, and DES stent to right proximal ramus intermediate;  diffuse 30-35% RCA,  diffuse 50% LAD;  preserved LVF w/ moderate anterolateral hypocontractility  . TRANSTHORACIC ECHOCARDIOGRAM  03/24/2015   basal, mid inferor and inferolateral hypokinesis, mild focal basal LVH, ef 57-84%, grade 1 diastolic dysfunction/  mild AV sclerosis without stenosis/  mild AR, MR, and TR/  mild LAE  . VENTRAL HERNIA REPAIR N/A 09/18/2012   Procedure: LAPAROSCOPIC VENTRAL HERNIA;  Surgeon: Ralene Ok, MD;  Location: [facility];  Service: General;  Laterality: N/A;     Home Meds: Prior to Admission medications   Medication Sig Start Date End Date Taking? Authorizing Provider  clopidogrel (PLAVIX) 75 MG tablet Take 75 mg by mouth daily. 03/06/17   [provider]  fluticasone (FLONASE) 50 MCG/ACT nasal spray Place 2 sprays into the nose daily as needed for allergies. 09/26/15   [provider]  losartan (COZAAR) 50 MG tablet Take 1 tablet (50 mg total) by mouth daily. 09/17/16   Josue Hector, MD  Melatonin 5 MG TABS Take 5 mg by mouth at bedtime as needed.    [provider]  metFORMIN (GLUCOPHAGE) 1000 MG tablet Take 1,000 mg by mouth 2 (two) times daily.  01/14/15   [provider]  NITROSTAT 0.4 MG SL tablet DISSOLVE ONE TABLET UNDER THE TONGUE EVERY 5 MINUTES AS NEEDED FOR CHEST PAIN.  DO NOT EXCEED A TOTAL OF 3 DOSES IN 15  MINUTES 08/11/15   Josue Hector, MD  pantoprazole (PROTONIX) 40 MG tablet Take 40 mg by mouth daily. 01/15/15   [provider]  rosuvastatin (CRESTOR) 10 MG tablet Take 1 tablet (10 mg total) by mouth daily. 03/14/17   Josue Hector, MD    Allergies:  Allergies  Allergen Reactions  . Penicillins Anaphylaxis    Knots on head; tongue swelling    Social History   Social History  . Marital status: Married    Spouse name: N/A  . Number of children: N/A  . Years of education: N/A   Occupational History  . Not on file.   Social History Main  Topics  . Smoking status: Never Smoker  . Smokeless tobacco: Former Systems developer    Types: Chew     Comment: quit 2003  . Alcohol use No  . Drug use: No  . Sexual activity: Not on file   Other Topics Concern  . Not on file   Social History Narrative  . No narrative on file     Family History  Problem Relation Age of Onset  . Cancer Sister        lung     ROS:  Please see the history of present illness.     All other systems reviewed and negative.    Physical Exam: Blood pressure 132/72, pulse 69, height 5\' 11"  (1.803 m), weight 192 lb (87.1 kg), SpO2 98 %. General: Well developed, well nourished male in no acute distress. Head: Normocephalic, atraumatic, sclera non-icteric, no xanthomas, nares are without discharge. EENT: normal  Lymph Nodes:  none Neck: Negative for carotid bruits. JVD not elevated. Back:without scoliosis kyphosis  Lungs: Clear bilaterally to auscultation without wheezes, rales, or rhonchi. Breathing is unlabored. Heart: RRR with S1 S2. No  * murmur . No rubs, or gallops appreciated. Abdomen: Soft, non-tender, non-distended with normoactive bowel sounds. No hepatomegaly. No rebound/guarding. No obvious abdominal masses. Msk:  Strength and tone appear normal for age. Extremities: No clubbing or cyanosis.  1+ edema.  Distal pedal pulses are 2+ and equal bilaterally. Skin: Warm and Dry Neuro: Alert and  oriented X 3. CN III-XII intact Grossly normal sensory and motor function . Psych:  Responds to questions appropriately with a normal affect.      Labs: Cardiac Enzymes No results for input(s): CKTOTAL, CKMB, TROPONINI in the last 72 hours. CBC Lab Results  Component Value Date   WBC 10.2 07/04/2014   HGB 15.0 03/26/2016   HCT 44.0 03/26/2016   MCV 91.6 07/04/2014   PLT 190 07/04/2014   PROTIME: No results for input(s): LABPROT, INR in the last 72 hours. Chemistry No results for input(s): NA, K, CL, CO2, BUN, CREATININE, CALCIUM, PROT, BILITOT, ALKPHOS, ALT, AST, GLUCOSE in the last 168 hours.  Invalid input(s): LABALBU Lipids Lab Results  Component Value Date   CHOL 97 03/02/2013   HDL 34 (L) 03/02/2013   LDLCALC 38 03/02/2013   TRIG 123 03/02/2013   BNP Pro B Natriuretic peptide (BNP)  Date/Time Value Ref Range Status  04/05/2013 11:41 AM 78.0 0.0 - 100.0 pg/mL Final  03/01/2013 12:32 AM 74.4 0 - 125 pg/mL Final   Thyroid Function Tests: No results for input(s): TSH, T4TOTAL, T3FREE, THYROIDAB in the last 72 hours.  Invalid input(s): FREET3 Miscellaneous No results found for: DDIMER  Radiology/Studies:  No results found.  EKG: 9/24 sinus at 59 Intervals 17/16/47 Right bundle branch block   Assessment and Plan:  Strokes-recurrent  Right bundle branch block   Ischemic heart disease with prior stenting  Multiple TERF   The patient has had recurrent strokes. They're described as ischemic. It is reasonable particularly given the demographic and multiple thromboembolic risk factors which has been associated with atrial fibrillation risk to pursue   loop recorder implantation exclude atrial fibrillation.  We have discussed the issues related to Antiplatelet therapy versus anticoagulant therapy, SCAF and is diminished risk of stroke compared to atrial fibrillation, and i have elected to pursue loop recorder implantation.  With his gait disturbance,   he is  to be scheduled consultation with neurology.  He is on lipid lowering therapy  Virl Axe

## 2017-03-17 NOTE — Discharge Instructions (Signed)
See implantable loop dc paper,

## 2017-03-21 ENCOUNTER — Ambulatory Visit: Payer: Self-pay | Admitting: Nurse Practitioner

## 2017-03-29 ENCOUNTER — Ambulatory Visit (INDEPENDENT_AMBULATORY_CARE_PROVIDER_SITE_OTHER): Payer: Medicare HMO | Admitting: *Deleted

## 2017-03-29 DIAGNOSIS — I639 Cerebral infarction, unspecified: Secondary | ICD-10-CM

## 2017-03-29 LAB — CUP PACEART INCLINIC DEVICE CHECK
Implantable Pulse Generator Implant Date: 20180927
MDC IDC SESS DTM: 20181009095923

## 2017-03-29 NOTE — Progress Notes (Signed)
Wound check in clinic s/p ILR implant. Steri strips removed. Incision edges approximated. Wound well healed without redness or edema. Normal device function. Battery status: GOOD. R-waves 0.37mV. 0 symptom episodes, 0 tachy episodes, 0 pause episodes, 0 brady episodes. 0 AF episodes (0% burden). Patient education completed including wound care and remote monitoring. Monthly summary reports and ROV with SK in 12 months.

## 2017-04-18 ENCOUNTER — Ambulatory Visit (INDEPENDENT_AMBULATORY_CARE_PROVIDER_SITE_OTHER): Payer: Medicare HMO | Admitting: *Deleted

## 2017-04-18 DIAGNOSIS — I639 Cerebral infarction, unspecified: Secondary | ICD-10-CM

## 2017-04-19 NOTE — Progress Notes (Signed)
Carelink Summary Report / Loop Recorder 

## 2017-04-22 LAB — CUP PACEART REMOTE DEVICE CHECK
Date Time Interrogation Session: 20181027113633
MDC IDC PG IMPLANT DT: 20180927

## 2017-04-26 ENCOUNTER — Other Ambulatory Visit: Payer: Self-pay | Admitting: Cardiovascular Disease

## 2017-04-26 MED ORDER — CLOPIDOGREL BISULFATE 75 MG PO TABS: 75.0000 mg | ORAL_TABLET | Freq: Every day | ORAL | 3 refills | Status: DC

## 2017-04-28 ENCOUNTER — Encounter: Payer: Self-pay | Admitting: Neurology

## 2017-04-28 ENCOUNTER — Ambulatory Visit: Payer: Medicare HMO | Admitting: Neurology

## 2017-04-28 VITALS — BP 144/78 | Ht 71.0 in | Wt 196.0 lb

## 2017-04-28 DIAGNOSIS — I639 Cerebral infarction, unspecified: Secondary | ICD-10-CM

## 2017-04-28 NOTE — Patient Instructions (Addendum)
I had a long d/w patient about his recent small embolic stroke, risk for recurrent stroke/TIAs, personally independently reviewed imaging studies and stroke evaluation results and answered questions.Continue Plavix 75 mg daily  for secondary stroke prevention and maintain strict control of hypertension with blood pressure goal below 130/90, diabetes with hemoglobin A1c goal below 6.5% and lipids with LDL cholesterol goal below 70 mg/dL. I also advised the patient to eat a healthy diet with plenty of whole grains, cereals, fruits and vegetables, exercise regularly and maintain ideal body weight .he also has mild dizziness and gait imbalance from diabetic neuropathy.  I advised him to get up slowly and avoid sudden movements and maintain control of diabetes.  Followup in the future with my practitioner in 3 months or call earlier if necessary.  Stroke Prevention Some medical conditions and behaviors are associated with an increased chance of having a stroke. You may prevent a stroke by making healthy choices and managing medical conditions. How can I reduce my risk of having a stroke?  Stay physically active. Get at least 30 minutes of activity on most or all days.  Do not smoke. It may also be helpful to avoid exposure to secondhand smoke.  Limit alcohol use. Moderate alcohol use is considered to be: ? No more than 2 drinks per day for men. ? No more than 1 drink per day for nonpregnant women.  Eat healthy foods. This involves: ? Eating 5 or more servings of fruits and vegetables a day. ? Making dietary changes that address high blood pressure (hypertension), high cholesterol, diabetes, or obesity.  Manage your cholesterol levels. ? Making food choices that are high in fiber and low in saturated fat, trans fat, and cholesterol may control cholesterol levels. ? Take any prescribed medicines to control cholesterol as directed by your health care provider.  Manage your diabetes. ? Controlling your  carbohydrate and sugar intake is recommended to manage diabetes. ? Take any prescribed medicines to control diabetes as directed by your health care provider.  Control your hypertension. ? Making food choices that are low in salt (sodium), saturated fat, trans fat, and cholesterol is recommended to manage hypertension. ? Ask your health care provider if you need treatment to lower your blood pressure. Take any prescribed medicines to control hypertension as directed by your health care provider. ? If you are 31-30 years of age, have your blood pressure checked every 3-5 years. If you are 7 years of age or older, have your blood pressure checked every year.  Maintain a healthy weight. ? Reducing calorie intake and making food choices that are low in sodium, saturated fat, trans fat, and cholesterol are recommended to manage weight.  Stop drug abuse.  Avoid taking birth control pills. ? Talk to your health care provider about the risks of taking birth control pills if you are over 56 years old, smoke, get migraines, or have ever had a blood clot.  Get evaluated for sleep disorders (sleep apnea). ? Talk to your health care provider about getting a sleep evaluation if you snore a lot or have excessive sleepiness.  Take medicines only as directed by your health care provider. ? For some people, aspirin or blood thinners (anticoagulants) are helpful in reducing the risk of forming abnormal blood clots that can lead to stroke. If you have the irregular heart rhythm of atrial fibrillation, you should be on a blood thinner unless there is a good reason you cannot take them. ? Understand all your medicine  instructions.  Make sure that other conditions (such as anemia or atherosclerosis) are addressed. Get help right away if:  You have sudden weakness or numbness of the face, arm, or leg, especially on one side of the body.  Your face or eyelid droops to one side.  You have sudden  confusion.  You have trouble speaking (aphasia) or understanding.  You have sudden trouble seeing in one or both eyes.  You have sudden trouble walking.  You have dizziness.  You have a loss of balance or coordination.  You have a sudden, severe headache with no known cause.  You have new chest pain or an irregular heartbeat. Any of these symptoms may represent a serious problem that is an emergency. Do not wait to see if the symptoms will go away. Get medical help at once. Call your local emergency services (911 in [place] Do not drive yourself to the hospital. This information is not intended to replace advice given to you by your health care provider. Make sure you discuss any questions you have with your health care provider. Document Released: 07/15/2004 Document Revised: 11/13/2015 Document Reviewed: 12/08/2012 Elsevier Interactive Patient Education  2017 [employer].

## 2017-04-28 NOTE — Progress Notes (Signed)
[facility] [address] [place]. [place] [address] [phone]       OFFICE CONSULT NOTE  Jose. Jose Duarte Date of Birth:  23-May-1941 Medical Record Number:  [id number]   Referring MD:  Royanne Foots  Reason for Referral:  stroke  HPI: Jose Duarte is a 81 year Caucasian male who developed sudden onset of left hand weakness on 03/04/17 in the evening.  History is obtained from the patient and review of medical records from [facility].  I personally reviewed imaging films.  He could not grip with his left hand to hold any objects.  He denied any pain tingling or numbness.  He was admitted to [facility].  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  MRI of the brain showed no large vessel anterior circulation stenosis.  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  Carotid ultrasound showed no significant extracranial stenosis.  Hemoglobin A1c was 7.1.  LDL cholesterol was 122 mg percent.  Transthoracic echo showed normal ejection fraction.  Loop recorder was placed by Dr. Caryl Comes on 03/14/17 and so for paroxysmal atrial fibrillation has not yet been found.  Patient was previously on aspirin which was switched to Plavix.  He is tolerating it well without bruising or bleeding.  He states his left-sided weakness has improved significantly.  He complains of dizziness and imbalance and some tingling in his feet which is had for 2-3 years.  He does have diabetes and likely has underlying mild neuropathy.  But he has had only occasional falls but no major injuries.  He has a prior history of a TIA in 2007 for which she was seen by me.  I do not have access to those records.  He was placed on aspirin and has done well and did not have any recurrent symptoms still recently.  ROS:   14 system review of systems is positive for hearing loss, ringing in the ears, cough, snoring and all other systems  negative  PMH:  Past Medical History:  Diagnosis Date  . Arthritis   . CAD S/P percutaneous coronary angioplasty - PCI RCA cardiologist-  dr Johnsie Cancel   03-01-2013 Promus Premier DES 3.0 mm x 16 mm - post-dilated to 3.2 mm - Dr. Claiborne Billings   . Cataract   . Diverticulosis of colon   . Family history of adverse reaction to anesthesia    brother --  ponv  . GERD (gastroesophageal reflux disease)   . History of adenomatous polyp of colon   . History of CVA (cerebrovascular accident)    01-06-2009  right posterior temporal and occipital lobe  . History of nonmelanoma skin cancer    EXCISION OF EAR  . History of ST elevation myocardial infarction (STEMI) 03/01/2013   inferolateral s/p  DES to RI  . History of transient ischemic attack (TIA)    2006  approx  . HTN (hypertension), benign 03/02/2013  . Hyperlipidemia   . Hypertension   . Incomplete right bundle branch block   . Ischemic cardiomyopathy    ef 45-50% per last echo 2016 and myview stress ef 52% 2016  . Myocardial infarction ([place])   . Nocturia   . OSA (obstructive sleep apnea)    study done  approx 2006--  no cpap  . Peripheral neuropathy   . Phimosis   . PONV (postoperative nausea and vomiting)   . S/P drug eluting coronary stent placement 03/01/2013   x1  to Ramus  . Stroke [facility])    2 "mini" strokes, no residual  . Type 2 diabetes mellitus ([place])   . Wears glasses     Social History:  Social History   Socioeconomic History  . Marital status: Married    Spouse name: Not on file  . Number of children: Not on file  . Years of education: Not on file  . Highest education level: Not on file  Social Needs  . Financial resource strain: Not on file  . Food insecurity - worry: Not on file  . Food insecurity - inability: Not on file  . Transportation needs - medical: Not on file  . Transportation needs - non-medical: Not on file  Occupational History  . Not on file  Tobacco Use  . Smoking status: Never Smoker  . Smokeless  tobacco: Former Systems developer    Types: Chew  . Tobacco comment: quit 2003  Substance and Sexual Activity  . Alcohol use: Yes    Alcohol/week: 0.6 oz    Types: 1 Glasses of wine per week    Comment: occasionally  . Drug use: No  . Sexual activity: Not on file  Other Topics Concern  . Not on file  Social History Narrative  . Not on file    Medications:   Current Outpatient Medications on File Prior to Visit  Medication Sig Dispense Refill  . acetaminophen (TYLENOL) 500 MG tablet Take 1,000 mg by mouth every 6 (six) hours as needed for mild pain or headache.    Marland Kitchen BESIVANCE 0.6 % SUSP     . clopidogrel (PLAVIX) 75 MG tablet Take 1 tablet (75 mg total) daily by mouth. 90 tablet 3  . DUREZOL 0.05 % EMUL     . fluticasone (FLONASE) 50 MCG/ACT nasal spray Place 2 sprays into the nose daily as needed for allergies.    Marland Kitchen losartan (COZAAR) 50 MG tablet Take 1 tablet (50 mg total) by mouth daily. 90 tablet 3  . Melatonin 5 MG TABS Take 5 mg by mouth at bedtime as needed.    . metFORMIN (GLUCOPHAGE) 1000 MG tablet Take 1,000 mg by mouth 2 (two) times daily.     Marland Kitchen NITROSTAT 0.4 MG SL tablet DISSOLVE ONE TABLET UNDER THE TONGUE EVERY 5 MINUTES AS NEEDED FOR CHEST PAIN.  DO NOT EXCEED A TOTAL OF 3 DOSES IN 15 MINUTES 25 tablet 3  . pantoprazole (PROTONIX) 40 MG tablet Take 40 mg by mouth daily.    . rosuvastatin (CRESTOR) 10 MG tablet Take 1 tablet (10 mg total) by mouth daily. 90 tablet 3  . PROLENSA 0.07 % SOLN      No current facility-administered medications on file prior to visit.     Allergies:   Allergies  Allergen Reactions  . Penicillins Anaphylaxis    Knots on head; tongue swelling Has patient had a PCN reaction causing immediate rash, facial/tongue/throat swelling, SOB or lightheadedness with hypotension: Yes Has patient had a PCN reaction causing severe rash involving mucus membranes or skin necrosis:No Has patient had a PCN reaction that required hospitalization: Unknown Has patient  had a PCN reaction occurring within the last 10 years: No If all of the above answers are "NO", then may proceed with Cephalosporin use.     Physical Exam General: well developed, well nourished, elderly Caucasian male seated, in no evident distress Head: head normocephalic and atraumatic.   Neck: supple with no carotid or supraclavicular bruits Cardiovascular: regular rate and rhythm, no murmurs  Musculoskeletal:  partially amputated left fourth finger Skin:  no rash/petichiae Vascular:  Normal pulses all extremities  Neurologic Exam Mental Status: Awake and fully alert. Oriented to place and time. Recent and remote memory intact. Attention span, concentration and fund of knowledge appropriate. Mood and affect appropriate.  Cranial Nerves: Fundoscopic exam reveals sharp disc margins. Pupils equal, briskly reactive to light. Extraocular movements full without nystagmus. Visual fields full to confrontation. Hearing intact. Facial sensation intact. Face, tongue, palate moves normally and symmetrically.  Motor: Normal bulk and tone. Normal strength in all tested extremity muscles.  Diminished fine finger movements on the left.  Orbits right over left upper extremity.  Mild weakness of left grip. Sensory.: intact to touch , pinprick , position   sensation.  Diminished vibration sensation bilaterally from ankle down.  Romberg sign weakly positive Coordination: Rapid alternating movements normal in all extremities. Finger-to-nose and heel-to-shin performed accurately bilaterally. Gait and Station: Arises from chair without difficulty. Stance is normal. Gait demonstrates normal stride length and balance . Able to heel, toe and tandem walk without difficulty.  Reflexes: 1+ and symmetric. Toes downgoing.   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  Remote history of TIA 10 years ago.  Vascular risk  factors of diabetes, hypertension, hyperlipidemia and cerebrovascular disease.  Long-standing mild gait and balance difficulties likely due to underlying diabetic neuropathy.    PLAN: I had a long d/w patient about his recent small embolic stroke, risk for recurrent stroke/TIAs, personally independently reviewed imaging studies and stroke evaluation results and answered questions.Continue Plavix 75 mg daily  for secondary stroke prevention and maintain strict control of hypertension with blood pressure goal below 130/90, diabetes with hemoglobin A1c goal below 6.5% and lipids with LDL cholesterol goal below 70 mg/dL. I also advised the patient to eat a healthy diet with plenty of whole grains, cereals, fruits and vegetables, exercise regularly and maintain ideal body weight .he also has mild dizziness and gait imbalance from diabetic neuropathy.  I advised him to get up slowly and avoid sudden movements and maintain control of diabetes.  Greater than 50% time during this 45-minute consultation visit was spent on counseling and coordination of care about his cryptogenic stroke and answering questions.He was advised to  followup in the future with my practitioner in 3 months or call earlier if necessary. Antony Contras, MD  [facility] [address] [place], [place] [address]  Phone [phone] Fax [phone] Note: This document was prepared with digital dictation and possible smart phrase technology. Any transcriptional errors that result from this process are unintentional.

## 2017-05-16 ENCOUNTER — Ambulatory Visit (INDEPENDENT_AMBULATORY_CARE_PROVIDER_SITE_OTHER): Payer: Medicare HMO | Admitting: *Deleted

## 2017-05-16 DIAGNOSIS — I639 Cerebral infarction, unspecified: Secondary | ICD-10-CM | POA: Diagnosis not present

## 2017-05-17 NOTE — Progress Notes (Signed)
Carelink Summary Report / Loop Recorder 

## 2017-05-26 LAB — CUP PACEART REMOTE DEVICE CHECK
Implantable Pulse Generator Implant Date: 20180927
MDC IDC SESS DTM: 20181126123846

## 2017-06-03 ENCOUNTER — Encounter: Payer: Self-pay | Admitting: Cardiovascular Disease

## 2017-06-05 NOTE — Progress Notes (Signed)
Patient ID: Jose Duarte, male   DOB: June 03, 1941, 76 y.o.   MRN: [id number]   76 y.o.  self referral. Previously seen by [name]. Did not like [facility] practice. Wanted to change to [facility]. On 03/01/13 He was taken acutely to the cath lab which revealed a totally occluded ramus branch. This was stented with a 3.0x16 mm Promus Premier DES stent postdilated 3.2 mm with 100% occlusion reduced to 0% and resumption of TIMI 3 flow.   03/06/14  ETT normal HTN response and PVCls BP meds adjusted 03/24/15 Myovue old scar no ischemia EF 52%  Medical Rx recommended   Nuclear stress EF: 52%.  There was no ST segment deviation noted during stress.  Defect 1: There is a large defect of moderate severity present in the basal inferolateral, basal anterolateral, mid inferolateral and mid anterolateral location. The patient has had a known lateral wall MI. There is no ischemia. His LV function is at the lower end of normal .  This is a low risk study.  Echo 03/24/15  EF a bit better than immediate post PCI/Stent  Study Conclusions  - Left ventricle: There is basal and mid inferior and inferolateral  hypokinesis. The cavity size was mildly dilated. There was mild  focal basal hypertrophy of the septum. Systolic function was  mildly reduced. The estimated ejection fraction was in the range  of 45% to 50%. Doppler parameters are consistent with abnormal  left ventricular relaxation (grade 1 diastolic dysfunction).  There was no evidence of elevated ventricular filling pressure by  Doppler parameters. - Aortic valve: Trileaflet; mildly thickened, mildly calcified  leaflets. Sclerosis without stenosis. There was mild  regurgitation. - Aortic root: The aortic root was normal in size. - Mitral valve: Structurally normal valve. There was mild  regurgitation directed centrally. - Left atrium: The atrium was mildly dilated. - Right ventricle: Systolic function was normal. - Right atrium: The atrium  was normal in size. - Tricuspid valve: There was mild regurgitation. - Pulmonary arteries: Systolic pressure was within the normal  range. - Inferior vena cava: The vessel was normal in size. The  respirophasic diameter changes were in the normal range (= 50%),  consistent with normal central venous pressure.  Interval History:  CVA diagnosed at [facility]:  Reviewed notes 03/05/17 Transient LUE weakness MRI with 2 small foci of acute ischemia in right precentral gyrus. Echo no embolic source. Telemetry with no afib. Carotids with no hemodynamically significant stenosis. A1c 7.1 and LDL 122. Has had previous CVA requiring tPa. D/c home on Plavix Also d/c on much higher dose crestor 20 mg had been on 5 mg   Seen by [facility] and loop recorder placed 03/17/17 He indicated this was very painful   Neuropathy and gait disorder pending neurology evaluation   ROS: Denies fever, malais, weight loss, blurry vision, decreased visual acuity, cough, sputum, SOB, hemoptysis, pleuritic pain, palpitaitons, heartburn, abdominal pain, melena, lower extremity edema, claudication, or rash.  All other systems reviewed and negative  General: BP (!) 178/88   Pulse 66   Ht 5\' 11"  (1.803 m)   Wt 197 lb 8 oz (89.6 kg)   SpO2 98%   BMI 27.55 kg/m  Affect appropriate Healthy:  appears stated age 18: normal Neck supple with no adenopathy JVP normal no bruits no thyromegaly Lungs clear with no wheezing and good diaphragmatic motion Heart:  S1/S2 no murmur, no rub, gallop or click PMI normal Abdomen: benighn, BS positve, no tenderness, no AAA no  bruit.  No HSM or HJR Distal pulses intact with no bruits No edema Neuro non-focal Skin warm and dry No muscular weakness     Current Outpatient Medications  Medication Sig Dispense Refill  . acetaminophen (TYLENOL) 500 MG tablet Take 1,000 mg by mouth every 6 (six) hours as needed for mild pain or headache.    Marland Kitchen BESIVANCE 0.6 % SUSP     . clopidogrel  (PLAVIX) 75 MG tablet Take 1 tablet (75 mg total) daily by mouth. 90 tablet 3  . DUREZOL 0.05 % EMUL     . fluticasone (FLONASE) 50 MCG/ACT nasal spray Place 2 sprays into the nose daily as needed for allergies.    Marland Kitchen losartan (COZAAR) 50 MG tablet Take 1 tablet (50 mg total) by mouth daily. 90 tablet 3  . Melatonin 5 MG TABS Take 5 mg by mouth at bedtime as needed.    . metFORMIN (GLUCOPHAGE) 1000 MG tablet Take 1,000 mg by mouth 2 (two) times daily.     Marland Kitchen NITROSTAT 0.4 MG SL tablet DISSOLVE ONE TABLET UNDER THE TONGUE EVERY 5 MINUTES AS NEEDED FOR CHEST PAIN.  DO NOT EXCEED A TOTAL OF 3 DOSES IN 15 MINUTES 25 tablet 3  . pantoprazole (PROTONIX) 40 MG tablet Take 40 mg by mouth daily.    Marland Kitchen PROLENSA 0.07 % SOLN     . rosuvastatin (CRESTOR) 10 MG tablet Take 1 tablet (10 mg total) by mouth daily. 90 tablet 3   No current facility-administered medications for this visit.     Allergies  Penicillins  Electrocardiogram:  SB rate 51 PVC  11/15  03/13/15  Sr rate 61 LAD ICRBBB 09/17/16 SR RBBB rate 58 03/08/17 SR RBBB no acute changes 03/14/17 SR rate 59 RBBB   Assessment and Plan CAD:  Stent to ramus in 2014 no ischemia on myovue 03/24/15 medical Rx  HTN:  On ARB given low EF stable DCM:  Ischemic EF 45-50% echo 03/24/2015 and 52% myovue same day  Chol:  Cut back to 10 mg due to myalgias and check labs in 3 months  DM :  Discussed low carb diet.  Target hemoglobin A1c is 6.5 or less.  Continue current medications. Prostate:  F/u urology consider changing flomax to proscar to avoid headaches  Neuropathy:  From DM affecting feet discuss with primary use of neurontin Stroke: has f/u with Max Sane with no PaF to date had device clinic talk to him about monitoring protocol   Jenkins Rouge

## 2017-06-08 ENCOUNTER — Encounter: Payer: Self-pay | Admitting: Cardiovascular Disease

## 2017-06-08 ENCOUNTER — Other Ambulatory Visit: Payer: Medicare HMO

## 2017-06-08 ENCOUNTER — Ambulatory Visit: Payer: Medicare HMO | Admitting: Cardiovascular Disease

## 2017-06-08 VITALS — BP 178/88 | HR 66 | Ht 71.0 in | Wt 197.5 lb

## 2017-06-08 DIAGNOSIS — E785 Hyperlipidemia, unspecified: Secondary | ICD-10-CM

## 2017-06-08 DIAGNOSIS — I2510 Atherosclerotic heart disease of native coronary artery without angina pectoris: Secondary | ICD-10-CM

## 2017-06-08 DIAGNOSIS — I420 Dilated cardiomyopathy: Secondary | ICD-10-CM

## 2017-06-08 DIAGNOSIS — Z9861 Coronary angioplasty status: Secondary | ICD-10-CM

## 2017-06-08 LAB — HEPATIC FUNCTION PANEL
ALBUMIN: 4.9 g/dL — AB (ref 3.5–4.8)
ALK PHOS: 65 IU/L (ref 39–117)
ALT: 25 IU/L (ref 0–44)
AST: 26 IU/L (ref 0–40)
BILIRUBIN TOTAL: 0.6 mg/dL (ref 0.0–1.2)
BILIRUBIN, DIRECT: 0.2 mg/dL (ref 0.00–0.40)
TOTAL PROTEIN: 7.1 g/dL (ref 6.0–8.5)

## 2017-06-08 LAB — LIPID PANEL
CHOL/HDL RATIO: 3 ratio (ref 0.0–5.0)
Cholesterol, Total: 122 mg/dL (ref 100–199)
HDL: 41 mg/dL (ref >39)
LDL Calculated: 50 mg/dL (ref 0–99)
Triglycerides: 153 mg/dL — ABNORMAL HIGH (ref 0–149)
VLDL Cholesterol Cal: 31 mg/dL (ref 5–40)

## 2017-06-08 NOTE — Patient Instructions (Addendum)

## 2017-06-09 ENCOUNTER — Telehealth: Payer: Self-pay

## 2017-06-09 DIAGNOSIS — E785 Hyperlipidemia, unspecified: Secondary | ICD-10-CM

## 2017-06-09 DIAGNOSIS — I2510 Atherosclerotic heart disease of native coronary artery without angina pectoris: Secondary | ICD-10-CM

## 2017-06-09 DIAGNOSIS — Z9861 Coronary angioplasty status: Principal | ICD-10-CM

## 2017-06-09 DIAGNOSIS — I10 Essential (primary) hypertension: Secondary | ICD-10-CM

## 2017-06-09 NOTE — Telephone Encounter (Signed)
-----   Message from Josue Hector, MD sent at 06/08/2017  3:43 PM EST ----- Cholesterol is at goal and LFT's are normal F/U labs in 6 months

## 2017-06-09 NOTE — Telephone Encounter (Signed)
Called patient with results. Per Dr. Johnsie Cancel, Cholesterol is at goal and LFT's are normal F/U labs in 6 months. Patient verbalized understanding and will come back for labs 12/08/17.

## 2017-06-15 ENCOUNTER — Ambulatory Visit (INDEPENDENT_AMBULATORY_CARE_PROVIDER_SITE_OTHER): Payer: Medicare HMO | Admitting: *Deleted

## 2017-06-15 DIAGNOSIS — I639 Cerebral infarction, unspecified: Secondary | ICD-10-CM

## 2017-06-16 NOTE — Progress Notes (Signed)
Carelink Summary Report / Loop Recorder 

## 2017-06-26 LAB — CUP PACEART REMOTE DEVICE CHECK
MDC IDC PG IMPLANT DT: 20180927
MDC IDC SESS DTM: 20181226123751

## 2017-07-01 ENCOUNTER — Telehealth: Payer: Self-pay | Admitting: Cardiovascular Disease

## 2017-07-01 NOTE — Telephone Encounter (Signed)
Follow up     Office calling back regarding preop clearance.

## 2017-07-01 NOTE — Telephone Encounter (Signed)
New message        [facility] Pre-operative Risk Assessment    Request for surgical clearance:  1. What type of surgery is being performed? Colonoscopy  2. When is this surgery scheduled? 07/07/17  3. Are there any medications that need to be held prior to surgery and how long?Plavix  4. Practice name and name of physician performing surgery? Dr. Lizbeth Bark  5. What is your office phone and fax number? Fax [phone], phone [phone]  6. Anesthesia type (None, local, MAC, general) ? general   Laurier Nancy 07/01/2017, 12:49 PM  _________________________________________________________________   (provider comments below)

## 2017-07-04 ENCOUNTER — Telehealth: Payer: Self-pay | Admitting: Neurology

## 2017-07-04 NOTE — Telephone Encounter (Signed)
From cardiology it is fine to hold the plavix but he is actually on it now due to CVA followed by Dr. [name].     Primary Cardiologist: No primary care provider on file.  Chart reviewed as part of pre-operative protocol coverage. Given past medical history and time since last visit, based on ACC/AHA guidelines, Jose Duarte would be at acceptable risk for the planned procedure without further cardiovascular testing.  From cardiology may stop Plavix but is now on for CVA.   I will route this recommendation to the requesting party via Epic fax function and remove from pre-op pool.  Please call with questions.  Cecilie Kicks, NP 07/04/2017, 4:53 PM

## 2017-07-04 NOTE — Telephone Encounter (Signed)
Patient may hold Plavix for 5 days prior to the scheduled colonoscopy with a small but acceptable peri-procedure risk of TIA/stroke if he is willing. Resume Plavix after colonoscopy when safe

## 2017-07-04 NOTE — Telephone Encounter (Signed)
Follow Up:     Please call asap,if not pt's procedure will have to be cancelled.

## 2017-07-04 NOTE — Telephone Encounter (Addendum)
Left vm for Barb at Dr. Hoyle Sauer office about pt stopping plavix. Patient is schedule 07/07/2017 for a colonoscopy.. No clearance form was fax to office regarding this issue. Also first phone call is today. Rn left message per Dr. [name] pt should stop 5 days prior to a procedure. Rn left message for patient to call back during business hours.

## 2017-07-04 NOTE — Telephone Encounter (Signed)
Follow up    Office calling back regarding preop clearance please call.

## 2017-07-04 NOTE — Telephone Encounter (Signed)
Barb with Dr. Perley Jain office is requesting patient to discontinue clopidogrel (PLAVIX) 75 MG tablet until after his colonoscopy on 07-07-17. Please call and advise.

## 2017-07-04 NOTE — Telephone Encounter (Signed)
07/04/17 left message for Barb at [phone] regarding patient's Plavix. Let her know Dr. [name] started the patient on Plavix for a stroke he had in September so Dr. Clydene Fake office will have to make the call regarding stopping Plavix or not.

## 2017-07-04 NOTE — Telephone Encounter (Signed)
Message sent to Dr. [name] stroke MD regarding message.

## 2017-07-05 NOTE — Telephone Encounter (Signed)
Barb with Dr. Perley Jain office is returning your call.

## 2017-07-05 NOTE — Telephone Encounter (Signed)
LEft vm at [phone] for Barb to call back regarding plavix, and colonoscopy.

## 2017-07-05 NOTE — Telephone Encounter (Signed)
Rn receive phone call from [place] about pts clearance. Raford Pitcher stated she can see notes in epic, but cannot chart. Barb from Dr. Hoyle Sauer office stated pt stop the plavix on Monday. She saw Dr. [name] note stating pt can stop 5 days prior with a small but acceptable peri procedure risk of TIA or stroke. Resume plavix when safe. Raford Pitcher will be telling pt to take a aspirin while off the plavix.Also she will tell pt of DR. Sethi clearance guidelines listed above.

## 2017-07-06 NOTE — Telephone Encounter (Signed)
Faxed cardiac clearance via EPIC to requesting provider 

## 2017-07-06 NOTE — Telephone Encounter (Signed)
Left message for Raford Pitcher to let her know Dr. Clydene Fake office will have to make the call regarding stopping Plavix or not.

## 2017-07-15 ENCOUNTER — Ambulatory Visit (INDEPENDENT_AMBULATORY_CARE_PROVIDER_SITE_OTHER): Payer: Medicare HMO | Admitting: *Deleted

## 2017-07-15 DIAGNOSIS — I639 Cerebral infarction, unspecified: Secondary | ICD-10-CM | POA: Diagnosis not present

## 2017-07-15 NOTE — Progress Notes (Signed)
Carelink Summary Report / Loop Recorder 

## 2017-07-22 LAB — CUP PACEART REMOTE DEVICE CHECK
Implantable Pulse Generator Implant Date: 20180927
MDC IDC SESS DTM: 20190125130625

## 2017-07-29 ENCOUNTER — Ambulatory Visit: Payer: Medicare HMO | Admitting: Nurse Practitioner

## 2017-08-10 ENCOUNTER — Telehealth: Payer: Self-pay | Admitting: *Deleted

## 2017-08-10 NOTE — Telephone Encounter (Signed)
Spoke with patient regarding sending transmission. Patient sent manual transmission. Advised patient once we receive it we will review it and call back if further recommendations are given. Patient verbalized understanding.

## 2017-08-16 NOTE — Telephone Encounter (Signed)
Manual transmission received on 08/10/17.  ECGs appear ST w/PVCs, undersensing, and artifact.  Will print and place in Dr. Janalyn Harder folder for review.

## 2017-08-17 ENCOUNTER — Ambulatory Visit (INDEPENDENT_AMBULATORY_CARE_PROVIDER_SITE_OTHER): Payer: Medicare HMO | Admitting: *Deleted

## 2017-08-17 DIAGNOSIS — I639 Cerebral infarction, unspecified: Secondary | ICD-10-CM | POA: Diagnosis not present

## 2017-08-17 NOTE — Progress Notes (Signed)
Carelink Summary Report / Loop Recorder 

## 2017-08-18 ENCOUNTER — Telehealth: Payer: Self-pay | Admitting: *Deleted

## 2017-08-18 NOTE — Telephone Encounter (Signed)
LMOM (DPR) requesting manual transmission from [facility] monitor.  Gave instructions and Device Clinic phone number for questions/concerns.  Received alert for 5 "AF" episodes--available ECG shows ST w/undersensing.  Will review additional ECGs when transmission received.

## 2017-08-26 ENCOUNTER — Encounter: Payer: Self-pay | Admitting: Adult Health

## 2017-08-26 ENCOUNTER — Ambulatory Visit: Payer: Medicare HMO | Admitting: Adult Health

## 2017-08-26 VITALS — BP 176/80 | HR 56 | Ht 71.0 in | Wt 199.0 lb

## 2017-08-26 DIAGNOSIS — I10 Essential (primary) hypertension: Secondary | ICD-10-CM | POA: Diagnosis not present

## 2017-08-26 DIAGNOSIS — I639 Cerebral infarction, unspecified: Secondary | ICD-10-CM | POA: Diagnosis not present

## 2017-08-26 DIAGNOSIS — E1149 Type 2 diabetes mellitus with other diabetic neurological complication: Secondary | ICD-10-CM | POA: Diagnosis not present

## 2017-08-26 DIAGNOSIS — E785 Hyperlipidemia, unspecified: Secondary | ICD-10-CM | POA: Diagnosis not present

## 2017-08-26 DIAGNOSIS — Z794 Long term (current) use of insulin: Secondary | ICD-10-CM

## 2017-08-26 NOTE — Patient Instructions (Addendum)
Continue clopidogrel 75 mg daily  and crestor  for secondary stroke prevention  Cetaphil Cream to help with itching - call if this does not resolve itching sensation  Referral for sleep study to look for sleep apnea and possible CPAP use  Continue to monitor loop recorder  Call if your neuropathy worsens or becomes painful  Continue to take blood pressure at home and record your readings - follow up with PCP for blood pressure and cholesterol management   Maintain strict control of hypertension with blood pressure goal below 130/90, diabetes with hemoglobin A1c goal below 6.5% and cholesterol with LDL cholesterol (bad cholesterol) goal below 70 mg/dL. I also advised the patient to eat a healthy diet with plenty of whole grains, cereals, fruits and vegetables, exercise regularly and maintain ideal body weight.  Followup in the future with me in 6 months

## 2017-08-26 NOTE — Progress Notes (Signed)
[facility] [address] [place]. [place] [address] [id number]       OFFICE FOLLOW UP NOTE  Jose. VICTORY Duarte Date of Birth:  1940/12/16 Medical Record Number:  [id number]   Referring MD:  Royanne Foots  Reason for Referral:  stroke  HPI:  History 04/28/17: Jose Jose Duarte is a 62 year Caucasian male with PMH of who developed sudden onset of left hand weakness on 03/04/17 in the evening.  History is obtained from the patient and review of medical records from [facility].  I personally reviewed imaging films.  He could not grip with his left hand to hold any objects.  He denied any pain tingling or numbness.  He was admitted to [facility].  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  MRA of the brain showed no large vessel anterior circulation stenosis.  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  Carotid ultrasound showed no significant extracranial stenosis.  Hemoglobin A1c was 7.1.  LDL cholesterol was 122 mg percent.  Transthoracic echo showed normal ejection fraction.  Loop recorder was placed by Dr. Caryl Comes on 03/14/17 and so for paroxysmal atrial fibrillation has not yet been found.  Patient was previously on aspirin which was switched to Plavix.  He is tolerating it well without bruising or bleeding.  He states his left-sided weakness has improved significantly.  He complains of dizziness and imbalance and some tingling in his feet which is had for 2-3 years.  He does have diabetes and likely has underlying mild neuropathy.  But he has had only occasional falls but no major injuries.  He has a prior history of a TIA in 2007 for which she was seen by me.  I do not have access to those records.  He was placed on aspirin and has done well and did not have any recurrent symptoms still recently.  Update 08/26/17: Patient is being seen today for a six-month follow-up.  Patient states that he has  no residual symptoms from his stroke.  He continues to take Plavix with mild bruising but no bleeding.  Patient also states since taking the Plavix he has an increase in itching with small red spots on his forearm and on his abdomen but only in the evening.  Patient does take Plavix in the morning.  Denies hive-like look and states they are just flat red spots.  Patient unsure if this is related to the Plavix or to dry skin. Patient states he read online that itching is a side effect of plavix. Patient denies shortness of breath or spreading of this redness besides mildly on his abdomen and forearms.  Patient also states that he is remodeling an older house - unsure if this has relevance.  Patient continues to take Crestor without issues of myalgias.  Patient's blood pressure at today's visit was elevated at 176/80.  Patient states he checked his blood pressure this morning at home and was 143/78 which is his normal. Loop recorder reviewed and negative for atrial fibrillation at this time. Patient continues to have diabetic neuropathy from ankles down bilaterally with numbness and tingling.  Patient states he will occasionally have pain at the end of his toes at night.  Patient does have complaints of snoring, frequent night awakening and daytime sleepiness.  Patient was evaluated for sleep apnea over 10 years ago and was told he had obstructive sleep apnea and will need his CPAP machine.  Per patient, he was going to have to pay over $600 a month to use the CPAP machine so he never obtained one.  Patient does state he wakes up frequently during the night, wakes up drowsy in the morning and is tired throughout the day.  Patient states his wife says he snores all night and will have to frequently elbow him to "restart him" during his apnic episodes.  Patient is in agreement at this time to having a reevaluation for sleep apnea and possible use of CPAP machine.  Patient denies new or worsening TIA/stroke  symptoms.  ROS:   14 system review of systems is positive for fatigue, hearing loss, runny nose, drooling, restless leg, frequent waking, daytime sleepiness, snoring, back pain, neck stiffness, and itching and all other systems negative  PMH:  Past Medical History:  Diagnosis Date  . Arthritis   . CAD S/P percutaneous coronary angioplasty - PCI RCA cardiologist-  dr Johnsie Cancel   03-01-2013 Promus Premier DES 3.0 mm x 16 mm - post-dilated to 3.2 mm - Dr. Claiborne Billings   . Cataract   . Diverticulosis of colon   . Family history of adverse reaction to anesthesia    brother --  ponv  . GERD (gastroesophageal reflux disease)   . History of adenomatous polyp of colon   . History of CVA (cerebrovascular accident)    01-06-2009  right posterior temporal and occipital lobe  . History of nonmelanoma skin cancer    EXCISION OF EAR  . History of ST elevation myocardial infarction (STEMI) 03/01/2013   inferolateral s/p  DES to RI  . History of transient ischemic attack (TIA)    2006  approx  . HTN (hypertension), benign 03/02/2013  . Hyperlipidemia   . Hypertension   . Incomplete right bundle branch block   . Ischemic cardiomyopathy    ef 45-50% per last echo 2016 and myview stress ef 52% 2016  . Myocardial infarction ([place])   . Nocturia   . OSA (obstructive sleep apnea)    study done  approx 2006--  no cpap  . Peripheral neuropathy   . Phimosis   . PONV (postoperative nausea and vomiting)   . S/P drug eluting coronary stent placement 03/01/2013   x1 to [place]  . Stroke [facility])    2 "mini" strokes, no residual  . Type 2 diabetes mellitus ([place])   . Wears glasses     Social History:  Social History   Socioeconomic History  . Marital status: Married    Spouse name: Not on file  . Number of children: Not on file  . Years of education: Not on file  . Highest education level: Not on file  Social Needs  . Financial resource strain: Not on file  . Food insecurity - worry: Not on file  . Food  insecurity - inability: Not on file  . Transportation needs - medical: Not on file  . Transportation needs - non-medical: Not on file  Occupational History  . Not on file  Tobacco Use  . Smoking status: Never Smoker  . Smokeless tobacco: Former Systems developer    Types: Chew  . Tobacco comment: quit 2003  Substance and Sexual Activity  . Alcohol use: Yes    Alcohol/week: 0.6 oz    Types: 1 Glasses of wine per week    Comment: occasionally  . Drug use: No  . Sexual activity: Not on file  Other Topics Concern  . Not on file  Social History Narrative  .  Not on file    Medications:   Current Outpatient Medications on File Prior to Visit  Medication Sig Dispense Refill  . acetaminophen (TYLENOL) 500 MG tablet Take 1,000 mg by mouth every 6 (six) hours as needed for mild pain or headache.    Marland Kitchen BESIVANCE 0.6 % SUSP     . clopidogrel (PLAVIX) 75 MG tablet Take 1 tablet (75 mg total) daily by mouth. 90 tablet 3  . DUREZOL 0.05 % EMUL     . fluticasone (FLONASE) 50 MCG/ACT nasal spray Place 2 sprays into the nose daily as needed for allergies.    Marland Kitchen losartan (COZAAR) 50 MG tablet Take 1 tablet (50 mg total) by mouth daily. 90 tablet 3  . Melatonin 5 MG TABS Take 5 mg by mouth at bedtime as needed.    . metFORMIN (GLUCOPHAGE) 1000 MG tablet Take 1,000 mg by mouth 2 (two) times daily.     Marland Kitchen NITROSTAT 0.4 MG SL tablet DISSOLVE ONE TABLET UNDER THE TONGUE EVERY 5 MINUTES AS NEEDED FOR CHEST PAIN.  DO NOT EXCEED A TOTAL OF 3 DOSES IN 15 MINUTES 25 tablet 3  . pantoprazole (PROTONIX) 40 MG tablet Take 40 mg by mouth daily.    Marland Kitchen PROLENSA 0.07 % SOLN     . rosuvastatin (CRESTOR) 10 MG tablet Take 1 tablet (10 mg total) by mouth daily. 90 tablet 3   No current facility-administered medications on file prior to visit.     Allergies:   Allergies  Allergen Reactions  . Penicillins Anaphylaxis    Knots on head; tongue swelling Has patient had a PCN reaction causing immediate rash, facial/tongue/throat  swelling, SOB or lightheadedness with hypotension: Yes Has patient had a PCN reaction causing severe rash involving mucus membranes or skin necrosis:No Has patient had a PCN reaction that required hospitalization: Unknown Has patient had a PCN reaction occurring within the last 10 years: No If all of the above answers are "NO", then may proceed with Cephalosporin use.     Physical Exam General: well developed, well nourished, elderly Caucasian male seated, in no evident distress Head: head normocephalic and atraumatic.   Neck: supple with no carotid or supraclavicular bruits Cardiovascular: regular rate and rhythm, no murmurs Musculoskeletal:  partially amputated left fourth finger Skin:  no rash/petichiae Vascular:  Normal pulses all extremities  Neurologic Exam Mental Status: Awake and fully alert. Oriented to place and time. Recent and remote memory intact. Attention span, concentration and fund of knowledge appropriate. Mood and affect appropriate.  Cranial Nerves: Fundoscopic exam reveals sharp disc margins. Pupils equal, briskly reactive to light. Extraocular movements full without nystagmus. Visual fields full to confrontation. Hearing intact. Facial sensation intact. Face, tongue, palate moves normally and symmetrically.  Motor: Normal bulk and tone. Normal strength in all tested extremity muscles. Equal hand grasps bilaterally. No orbiting or decrease in fine finger movements.  Sensory.: intact to touch , pinprick , position   sensation.  Diminished vibration sensation bilaterally from ankle down.  Romberg sign mildly positive Coordination: Rapid alternating movements normal in all extremities. Finger-to-nose and heel-to-shin performed accurately bilaterally. Gait and Station: Arises from chair without difficulty. Stance is normal. Gait demonstrates normal stride length but slight balance issues. Able to heel, toe walk without difficulty but unable to perform tandem walk.  Reflexes:  1+ and symmetric. Toes downgoing.   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  Remote history of TIA 10 years ago.  Vascular risk factors of diabetes, hypertension, hyperlipidemia and cerebrovascular disease.  C/o sleep apnea symptoms, neuropathy and mild itching (dry skin vs medication side effect). No new stroke/TIA symptoms. No residual deficit from stroke.    PLAN: Continue clopidogrel 75 mg daily  and crestor  for secondary stroke prevention  Cetaphil Cream for puritis - dry skin vs medication side effect - patient advised to call if this does not resolve  Referral for sleep study  Continue to monitor loop recorder  Pt defer medication tx for neuropathy at this time - advised to call if pain frequency increases   Continue to take blood pressure at home and record your readings - follow up with PCP for blood pressure and cholesterol management  Maintain strict control of hypertension with blood pressure goal below 130/90, diabetes with hemoglobin A1c goal below 6.5% and cholesterol with LDL cholesterol (bad cholesterol) goal below 70 mg/dL. I also advised the patient to eat a healthy diet with plenty of whole grains, cereals, fruits and vegetables, exercise regularly and maintain ideal body weight.  Followup in the future with me in 6 months    Venancio Poisson, [facility]  [facility] [address] [place], [place] [address]  Phone [phone] Fax [phone]

## 2017-08-26 NOTE — Telephone Encounter (Signed)
LVMOM requesting manual transmission from [facility] monitor. [facility] phone number given for questions or concerns.

## 2017-08-31 NOTE — Progress Notes (Signed)
I reviewed note and agree with plan.   Mustapha Colson R. Shronda Boeh, MD  Certified in Neurology, Neurophysiology and Neuroimaging  [facility] [address], [address] [place], [place] [address] [phone]   

## 2017-09-02 NOTE — Telephone Encounter (Signed)
Manual transmission received 08/29/17.  All available "AF" ECGs appear false, ST w/ectopy and undersensing.  ECGs printed and placed in Dr. Janalyn Harder folder for review.

## 2017-09-19 ENCOUNTER — Ambulatory Visit (INDEPENDENT_AMBULATORY_CARE_PROVIDER_SITE_OTHER): Payer: Medicare HMO | Admitting: *Deleted

## 2017-09-19 DIAGNOSIS — I639 Cerebral infarction, unspecified: Secondary | ICD-10-CM

## 2017-09-19 NOTE — Progress Notes (Signed)
Carelink Summary Report / Loop Recorder 

## 2017-09-23 ENCOUNTER — Other Ambulatory Visit: Payer: Self-pay | Admitting: Internal Medicine

## 2017-09-24 LAB — CUP PACEART REMOTE DEVICE CHECK
Implantable Pulse Generator Implant Date: 20180927
MDC IDC SESS DTM: 20190227151352

## 2017-09-29 ENCOUNTER — Other Ambulatory Visit: Payer: Self-pay | Admitting: Cardiovascular Disease

## 2017-10-05 ENCOUNTER — Telehealth: Payer: Self-pay | Admitting: Cardiovascular Disease

## 2017-10-05 NOTE — Telephone Encounter (Signed)
New Message:     Victorino December is calling to let us know that pt has not filled rosuvastatin (CRESTOR) 10 MG tablet. Pt has not filled prescription since 06/21/2017. Leann states she reached out to the pt but states she was unable to reach him.

## 2017-10-06 NOTE — Telephone Encounter (Signed)
Hi, Jose Duarte to tell only thing it says it [employer]. Need to call back and get more information.

## 2017-10-06 NOTE — Telephone Encounter (Signed)
Left message for patient to call back  

## 2017-10-07 NOTE — Telephone Encounter (Signed)
Called patient about his message. Patient is taking Crestor 10 mg (1/2 tablet of 20 mg ), so patient is taking his Crestor as directed.

## 2017-10-07 NOTE — Telephone Encounter (Signed)
Follow up    Patient is returning call to nurse  FYI 1. Name of Medication: rosuvastatin (CRESTOR) 10 MG tablet  2. How are you currently taking this medication (dosage and times per day)? As prescribed 3. Are you having a reaction (difficulty breathing--STAT)? NO  4. What is your medication issue? Patient states he is taking medication and cutting tablet in half.

## 2017-10-17 ENCOUNTER — Institutional Professional Consult (permissible substitution): Payer: Medicare HMO | Admitting: Neurology

## 2017-10-24 ENCOUNTER — Ambulatory Visit (INDEPENDENT_AMBULATORY_CARE_PROVIDER_SITE_OTHER): Payer: Medicare HMO | Admitting: *Deleted

## 2017-10-24 DIAGNOSIS — I639 Cerebral infarction, unspecified: Secondary | ICD-10-CM

## 2017-10-24 LAB — CUP PACEART REMOTE DEVICE CHECK
Date Time Interrogation Session: 20190401154100
Implantable Pulse Generator Implant Date: 20180927

## 2017-10-25 NOTE — Progress Notes (Signed)
Carelink Summary Report / Loop Recorder 

## 2017-11-15 LAB — CUP PACEART REMOTE DEVICE CHECK
Date Time Interrogation Session: 20190504160559
Implantable Pulse Generator Implant Date: 20180927

## 2017-11-24 ENCOUNTER — Ambulatory Visit (INDEPENDENT_AMBULATORY_CARE_PROVIDER_SITE_OTHER): Payer: Medicare HMO | Admitting: *Deleted

## 2017-11-24 DIAGNOSIS — I639 Cerebral infarction, unspecified: Secondary | ICD-10-CM

## 2017-11-25 NOTE — Progress Notes (Signed)
Carelink Summary Report / Loop Recorder 

## 2017-11-28 ENCOUNTER — Encounter: Payer: Self-pay | Admitting: Neurology

## 2017-11-28 ENCOUNTER — Ambulatory Visit (INDEPENDENT_AMBULATORY_CARE_PROVIDER_SITE_OTHER): Payer: Medicare HMO | Admitting: Neurology

## 2017-11-28 VITALS — BP 151/78 | HR 59 | Ht 71.0 in | Wt 194.0 lb

## 2017-11-28 DIAGNOSIS — R351 Nocturia: Secondary | ICD-10-CM

## 2017-11-28 DIAGNOSIS — G4719 Other hypersomnia: Secondary | ICD-10-CM | POA: Diagnosis not present

## 2017-11-28 DIAGNOSIS — Z820 Family history of epilepsy and other diseases of the nervous system: Secondary | ICD-10-CM | POA: Diagnosis not present

## 2017-11-28 DIAGNOSIS — Z8673 Personal history of transient ischemic attack (TIA), and cerebral infarction without residual deficits: Secondary | ICD-10-CM

## 2017-11-28 DIAGNOSIS — Z9861 Coronary angioplasty status: Secondary | ICD-10-CM

## 2017-11-28 DIAGNOSIS — R51 Headache: Secondary | ICD-10-CM

## 2017-11-28 DIAGNOSIS — R519 Headache, unspecified: Secondary | ICD-10-CM

## 2017-11-28 DIAGNOSIS — R0683 Snoring: Secondary | ICD-10-CM

## 2017-11-28 DIAGNOSIS — E663 Overweight: Secondary | ICD-10-CM | POA: Diagnosis not present

## 2017-11-28 NOTE — Progress Notes (Signed)
Subjective:    Patient ID: Jose Duarte is a 77 y.o. male.  HPI     Star Age, MD, PhD [facility] [address], [address] [place], [place] [address]  Dear Janett Billow,   I saw your patient, Jose Duarte, upon your kind request, in my today for initial consultation of his obstructive sleep apnea, for re-evaluation of a prior diagnosis of OSA. The patient is unaccompanied today. As you know, [name] a 77 year old right-handed gentleman with an underlying complex medical history of hypertension, hyperlipidemia, stroke, right bundle branch block, ischemic cardiomyopathy with history of MI and stent placement, diabetes with neur), diverticulosis, reflux disease and mildly overweight state, who reports snoring and excessive daytime somnolence. He had a sleep study in the 70s. He has never been on CPAP therapy, but was told to start CPAP therapy, could not afford it at the time. I reviewed your office from 08/26/2017. His Epworth sleepiness score is 12 out of 24, fatigue score is 34 out of 63. He is a nonsmoker and does not utilize alcohol, he drinks caffeine in the form of coffee, 4 cups per day and occasional soda or tea. He has a loop recorder in place.  He is moving to a new house soon, would like to settle in before doing a sleep study. He retired from working at the [employer] for 46 years. He has nocturia about once or twice per average night, he has had occasional morning headaches. His wife reports to him that he still has apneas, not as bad as it used to be. His bedtime is around 9 and rise time often around 5. He has 1 dog, not in the bedroom with them typically. He has 2 sons, one has sleep apnea and uses a CPAP machine.  His Past Medical History Is Significant For: Past Medical History:  Diagnosis Date  . Arthritis   . CAD S/P percutaneous coronary angioplasty - PCI RCA cardiologist-  dr Johnsie Cancel   03-01-2013 Promus Premier DES 3.0 mm x 16  mm - post-dilated to 3.2 mm - Dr. Claiborne Billings   . Cataract   . Diverticulosis of colon   . Family history of adverse reaction to anesthesia    brother --  ponv  . GERD (gastroesophageal reflux disease)   . History of adenomatous polyp of colon   . History of CVA (cerebrovascular accident)    01-06-2009  right posterior temporal and occipital lobe  . History of nonmelanoma skin cancer    EXCISION OF EAR  . History of ST elevation myocardial infarction (STEMI) 03/01/2013   inferolateral s/p  DES to [place]  . History of transient ischemic attack (TIA)    2006  approx  . HTN (hypertension), benign 03/02/2013  . Hyperlipidemia   . Hypertension   . Incomplete right bundle branch block   . Ischemic cardiomyopathy    ef 45-50% per last echo 2016 and myview stress ef 52% 2016  . Myocardial infarction ([place])   . Nocturia   . OSA (obstructive sleep apnea)    study done  approx 2006--  no cpap  . Peripheral neuropathy   . Phimosis   . PONV (postoperative nausea and vomiting)   . S/P drug eluting coronary stent placement 03/01/2013   x1 to [place]  . Stroke [facility])    2 "mini" strokes, no residual  . Type 2 diabetes mellitus ([place])   . Wears glasses     His Past Surgical History Is Significant  For: Past Surgical History:  Procedure Laterality Date  . CARDIAC CATHETERIZATION  09-18-2004   [facility]   no significant cad, normal LVF, ef 60%  . CARDIOVASCULAR STRESS TEST  03-24-2015   dr Johnsie Cancel   Low risk nuclear study w/ no ischemia;  (previous known lateral wall MI) non-reversible large moderate severity defect in the basal and mid inferolateral , basal and mid anterolateral ;  nuclear stress ef 52%  . CERVICAL SPINE SURGERY  1980's  . CIRCUMCISION N/A 03/26/2016   Procedure: CIRCUMCISION ADULT;  Surgeon: Kathie Rhodes, MD;  Location: [facility];  Service: Urology;  Laterality: N/A;  . COLON SURGERY    . DIAGNOSTIC LAPAROSCOPY    . EXPLORATORY LAPAROTOMY/  RIGHT COLECTOMY/  DRAINAGE  INTRA-ABDOMINAL SUBPHRENIC ABSCESS AND EXCISION INFLAMMATORY MESENTERY ABSCESS WITHIN TRANSVERSE COLON  09-01-2009   dr Rise Patience  . HERNIA REPAIR  1970's  . INGUINAL HERNIA REPAIR Left 09/18/2012   Procedure: LAPAROSCOPIC INGUINAL HERNIA;  Surgeon: Ralene Ok, MD;  Location: [facility];  Service: General;  Laterality: Left;  . INGUINAL HERNIA REPAIR Left 07/03/2014   Procedure: LEFT INGUINAL HERNIA REPAIR WITH MESH;  Surgeon: Pedro Earls, MD;  Location: [facility];  Service: General;  Laterality: Left;  . INSERTION OF MESH Left 09/18/2012   Procedure: INSERTION OF MESH;  Surgeon: Ralene Ok, MD;  Location: [facility];  Service: General;  Laterality: Left;  . LAPAROSCOPIC CHOLECYSTECTOMY  11-22-5   dr Rosana Hoes  . LAPAROSCOPIC LYSIS OF ADHESIONS N/A 09/18/2012   Procedure: LAPAROSCOPIC LYSIS OF ADHESIONS;  Surgeon: Ralene Ok, MD;  Location: [facility];  Service: General;  Laterality: N/A;  . LEFT HEART CATHETERIZATION WITH CORONARY ANGIOGRAM N/A 03/01/2013   Procedure: LEFT HEART CATHETERIZATION WITH CORONARY ANGIOGRAM;  Surgeon: Troy Sine, MD;  Location: [facility] CATH LAB;  Service: Cardiovascular;  Laterality: N/A;  PTCA, thrombectomy, and DES stent to right proximal ramus intermediate;  diffuse 30-35% RCA,  diffuse 50% LAD;  preserved LVF w/ moderate anterolateral hypocontractility  . LOOP RECORDER INSERTION N/A 03/17/2017   Procedure: LOOP RECORDER INSERTION;  Surgeon: Deboraha Sprang, MD;  Location: [facility];  Service: Cardiovascular;  Laterality: N/A;  . TRANSTHORACIC ECHOCARDIOGRAM  03/24/2015   basal, mid inferor and inferolateral hypokinesis, mild focal basal LVH, ef 16-10%, grade 1 diastolic dysfunction/  mild AV sclerosis without stenosis/  mild AR, MR, and TR/  mild LAE  . VENTRAL HERNIA REPAIR N/A 09/18/2012   Procedure: LAPAROSCOPIC VENTRAL HERNIA;  Surgeon: Ralene Ok, MD;  Location: [facility];  Service: General;  Laterality: N/A;    His Family History Is Significant  For: Family History  Problem Relation Age of Onset  . Cancer Sister        lung    His Social History Is Significant For: Social History   Socioeconomic History  . Marital status: Married    Spouse name: Not on file  . Number of children: Not on file  . Years of education: Not on file  . Highest education level: Not on file  Occupational History  . Not on file  Social Needs  . Financial resource strain: Not on file  . Food insecurity:    Worry: Not on file    Inability: Not on file  . Transportation needs:    Medical: Not on file    Non-medical: Not on file  Tobacco Use  . Smoking status: Never Smoker  . Smokeless tobacco: Former Systems developer    Types: Chew  .  Tobacco comment: quit 2003  Substance and Sexual Activity  . Alcohol use: Yes    Alcohol/week: 0.6 oz    Types: 1 Glasses of wine per week    Comment: occasionally  . Drug use: No  . Sexual activity: Not on file  Lifestyle  . Physical activity:    Days per week: Not on file    Minutes per session: Not on file  . Stress: Not on file  Relationships  . Social connections:    Talks on phone: Not on file    Gets together: Not on file    Attends religious service: Not on file    Active member of club or organization: Not on file    Attends meetings of clubs or organizations: Not on file    Relationship status: Not on file  Other Topics Concern  . Not on file  Social History Narrative  . Not on file    His Allergies Are:  Allergies  Allergen Reactions  . Penicillins Anaphylaxis    Knots on head; tongue swelling Has patient had a PCN reaction causing immediate rash, facial/tongue/throat swelling, SOB or lightheadedness with hypotension: Yes Has patient had a PCN reaction causing severe rash involving mucus membranes or skin necrosis:No Has patient had a PCN reaction that required hospitalization: Unknown Has patient had a PCN reaction occurring within the last 10 years: No If all of the above answers are "NO",  then may proceed with Cephalosporin use.   :   His Current Medications Are:  Outpatient Encounter Medications as of 11/28/2017  Medication Sig  . acetaminophen (TYLENOL) 500 MG tablet Take 1,000 mg by mouth every 6 (six) hours as needed for mild pain or headache.  . clopidogrel (PLAVIX) 75 MG tablet Take 1 tablet (75 mg total) daily by mouth.  . fluticasone (FLONASE) 50 MCG/ACT nasal spray Place 2 sprays into the nose daily as needed for allergies.  Marland Kitchen glimepiride (AMARYL) 4 MG tablet Take 4 mg by mouth daily.  Marland Kitchen losartan (COZAAR) 50 MG tablet TAKE 1 TABLET BY MOUTH ONCE DAILY  . Melatonin 5 MG TABS Take 5 mg by mouth at bedtime as needed.  . metFORMIN (GLUCOPHAGE) 500 MG tablet Take 500 mg by mouth 2 (two) times daily with a meal.  . NITROSTAT 0.4 MG SL tablet DISSOLVE ONE TABLET UNDER THE TONGUE EVERY 5 MINUTES AS NEEDED FOR CHEST PAIN.  DO NOT EXCEED A TOTAL OF 3 DOSES IN 15 MINUTES  . pantoprazole (PROTONIX) 40 MG tablet Take 40 mg by mouth daily.  . rosuvastatin (CRESTOR) 10 MG tablet Take 1 tablet (10 mg total) by mouth daily.  . [DISCONTINUED] metFORMIN (GLUCOPHAGE) 1000 MG tablet Take 1,000 mg by mouth 2 (two) times daily.    No facility-administered encounter medications on file as of 11/28/2017.   :  Review of Systems:  Out of a complete 14 point review of systems, all are reviewed and negative with the exception of these symptoms as listed below: Review of Systems  Neurological:       Pt presents today to discuss his sleep. Pt had a sleep study in the '70s. Pt does endorse snoring.  Epworth Sleepiness Scale 0= would never doze 1= slight chance of dozing 2= moderate chance of dozing 3= high chance of dozing  Sitting and reading: 2 Watching TV: 2 Sitting inactive in a public place (ex. Theater or meeting): 1 As a passenger in a car for an hour without a break: 1 Lying down to  rest in the afternoon: 3 Sitting and talking to someone: 1 Sitting quietly after lunch (no  alcohol): 2 In a car, while stopped in traffic: 0 Total: 12     Objective:  Neurological Exam  Physical Exam Physical Examination:   Vitals:   11/28/17 1309  BP: (!) 151/78  Pulse: (!) 59    General Examination: The patient is a very pleasant 77 y.o. male in no acute distress. He appears well-developed and well-nourished and well groomed.   HEENT: Normocephalic, atraumatic, pupils are equal, round and reactive to light and accommodation. Extraocular tracking is good without limitation to gaze excursion or nystagmus noted. Normal smooth pursuit is noted. Hearing is impaired (has hearing aids, but does not use them often). Face is symmetric with normal facial animation and normal facial sensation. Speech is clear with no dysarthria noted. There is no hypophonia. There is no lip, neck/head, jaw or voice tremor. Neck is supple with full range of passive and active motion. There are no carotid bruits on auscultation. Oropharynx exam reveals: mild mouth dryness, adequate dental hygiene and mild airway crowding, due to wider uvula and smaller airway entry. Mallampati is class II. Tongue protrudes centrally and palate elevates symmetrically. Tonsils are small. Neck size is 17.25 inches.   Chest: Clear to auscultation without wheezing, rhonchi or crackles noted.  Heart: S1+S2+0, regular and normal without murmurs, rubs or gallops noted.   Abdomen: Soft, non-tender and non-distended with normal bowel sounds appreciated on auscultation.  Extremities: There is no pitting edema in the distal lower extremities bilaterally. Pedal pulses are intact.  Skin: Warm and dry without trophic changes noted. There are varicose veins in the L>R calves.  Musculoskeletal: exam reveals no obvious joint deformities, tenderness or joint swelling or erythema.   Neurologically:  Mental status: The patient is awake, alert and oriented in all 4 spheres. His immediate and remote memory, attention, language skills and  fund of knowledge are appropriate. There is no evidence of aphasia, agnosia, apraxia or anomia. Speech is clear with normal prosody and enunciation. Thought process is linear. Mood is normal and affect is normal.  Cranial nerves II - XII are as described above under HEENT exam. In addition: shoulder shrug is normal with equal shoulder height noted. Motor exam: Normal bulk, strength and tone is noted. There is no drift, tremor or rebound. Romberg is negative. Reflexes are 2+ throughout. Fine motor skills and coordination: intact with normal finger taps, normal hand movements, normal rapid alternating patting, normal foot taps and normal foot agility.  Cerebellar testing: No dysmetria or intention tremor. There is no truncal or gait ataxia.  Sensory exam: intact to light touch in the upper and lower extremities.  Gait, station and balance: He stands easily. No veering to one side is noted. No leaning to one side is noted. Posture is age-appropriate to mildly stooped in the lower back, and stance is narrow based. Gait shows normal stride length and normal pace. No problems turning are noted.   Assessment and Plan:  In summary, BRAN ALDRIDGE is a very pleasant 77 y.o.-year old male with an underlying complex medical history of hypertension, hyperlipidemia, stroke, right bundle branch block, ischemic cardiomyopathy with history of MI and stent placement, diabetes with neur), diverticulosis, reflux disease and mildly overweight state, whose history and physical exam are in keeping with obstructive sleep apnea (OSA). I had a long chat with the patient about my findings and the diagnosis of OSA, its prognosis and treatment options. We talked  about medical treatments, surgical interventions and non-pharmacological approaches. I explained in particular the risks and ramifications of untreated moderate to severe OSA, especially with respect to developing cardiovascular disease down the Road, including congestive  heart failure, difficult to treat hypertension, cardiac arrhythmias, or stroke. Even type 2 diabetes has, in part, been linked to untreated OSA. Symptoms of untreated OSA include daytime sleepiness, memory problems, mood irritability and mood disorder such as depression and anxiety, lack of energy, as well as recurrent headaches, especially morning headaches. We talked about trying to maintain a healthy lifestyle in general, as well as the importance of weight control. I encouraged the patient to eat healthy, exercise daily and keep well hydrated, to keep a scheduled bedtime and wake time routine, to not skip any meals and eat healthy snacks in between meals. I advised the patient not to drive when feeling sleepy. I recommended the following at this time: sleep study with potential positive airway pressure titration. (We will score hypopneas at 4%).   I explained the sleep test procedure to the patient and also outlined possible surgical and non-surgical treatment options of OSA. I also explained the CPAP treatment option to the patient, who indicated that he would be willing to try CPAP if the need arises. I explained the importance of being compliant with PAP treatment, not only for insurance purposes but primarily to improve His symptoms, and for the patient's long term health benefit, including to reduce His cardiovascular risks. I answered all his questions today and the patient was in agreement. I plan to see him back after the sleep study is completed and encouraged him to call with any interim questions, concerns, problems or updates.   Thank you very much for allowing me to participate in the care of this nice patient. If I can be of any further assistance to you please do not hesitate to talk to me.  Sincerely,   Star Age, MD, PhD

## 2017-11-28 NOTE — Patient Instructions (Signed)

## 2017-12-08 ENCOUNTER — Other Ambulatory Visit: Payer: Medicare HMO

## 2017-12-08 DIAGNOSIS — Z9861 Coronary angioplasty status: Principal | ICD-10-CM

## 2017-12-08 DIAGNOSIS — I10 Essential (primary) hypertension: Secondary | ICD-10-CM

## 2017-12-08 DIAGNOSIS — I2510 Atherosclerotic heart disease of native coronary artery without angina pectoris: Secondary | ICD-10-CM

## 2017-12-08 DIAGNOSIS — E785 Hyperlipidemia, unspecified: Secondary | ICD-10-CM

## 2017-12-08 LAB — LIPID PANEL
CHOLESTEROL TOTAL: 108 mg/dL (ref 100–199)
Chol/HDL Ratio: 2.2 ratio (ref 0.0–5.0)
HDL: 50 mg/dL (ref >39)
LDL Calculated: 45 mg/dL (ref 0–99)
TRIGLYCERIDES: 67 mg/dL (ref 0–149)
VLDL Cholesterol Cal: 13 mg/dL (ref 5–40)

## 2017-12-08 LAB — HEPATIC FUNCTION PANEL
ALK PHOS: 55 IU/L (ref 39–117)
ALT: 31 IU/L (ref 0–44)
AST: 36 IU/L (ref 0–40)
Albumin: 4.7 g/dL (ref 3.5–4.8)
BILIRUBIN, DIRECT: 0.22 mg/dL (ref 0.00–0.40)
Bilirubin Total: 0.7 mg/dL (ref 0.0–1.2)
Total Protein: 6.3 g/dL (ref 6.0–8.5)

## 2017-12-09 ENCOUNTER — Telehealth: Payer: Self-pay

## 2017-12-09 DIAGNOSIS — E785 Hyperlipidemia, unspecified: Secondary | ICD-10-CM

## 2017-12-09 NOTE — Telephone Encounter (Signed)
-----   Message from Josue Hector, MD sent at 12/09/2017  7:56 AM EDT ----- Cholesterol is at goal and LFT's are normal F/U labs in 6 months

## 2017-12-09 NOTE — Telephone Encounter (Signed)
Patient having lab work done on 06/19/18

## 2017-12-20 ENCOUNTER — Ambulatory Visit (INDEPENDENT_AMBULATORY_CARE_PROVIDER_SITE_OTHER): Payer: Medicare HMO | Admitting: Neurology

## 2017-12-20 DIAGNOSIS — G4733 Obstructive sleep apnea (adult) (pediatric): Secondary | ICD-10-CM

## 2017-12-20 DIAGNOSIS — R0683 Snoring: Secondary | ICD-10-CM

## 2017-12-20 DIAGNOSIS — Z8673 Personal history of transient ischemic attack (TIA), and cerebral infarction without residual deficits: Secondary | ICD-10-CM

## 2017-12-20 DIAGNOSIS — Z820 Family history of epilepsy and other diseases of the nervous system: Secondary | ICD-10-CM

## 2017-12-20 DIAGNOSIS — G4720 Circadian rhythm sleep disorder, unspecified type: Secondary | ICD-10-CM

## 2017-12-20 DIAGNOSIS — R519 Headache, unspecified: Secondary | ICD-10-CM

## 2017-12-20 DIAGNOSIS — G4719 Other hypersomnia: Secondary | ICD-10-CM

## 2017-12-20 DIAGNOSIS — E663 Overweight: Secondary | ICD-10-CM

## 2017-12-20 DIAGNOSIS — R351 Nocturia: Secondary | ICD-10-CM

## 2017-12-20 DIAGNOSIS — R51 Headache: Secondary | ICD-10-CM

## 2017-12-20 DIAGNOSIS — Z9861 Coronary angioplasty status: Secondary | ICD-10-CM

## 2017-12-23 ENCOUNTER — Ambulatory Visit (INDEPENDENT_AMBULATORY_CARE_PROVIDER_SITE_OTHER): Payer: Medicare HMO | Admitting: *Deleted

## 2017-12-23 DIAGNOSIS — I639 Cerebral infarction, unspecified: Secondary | ICD-10-CM

## 2017-12-27 ENCOUNTER — Telehealth: Payer: Self-pay

## 2017-12-27 NOTE — Telephone Encounter (Signed)
I called pt. I advised pt that Dr. Rexene Alberts reviewed their sleep study results and found that pt has moderate to severe sleep apean, with evidence of mixed apnea and recommends that pt be treated with a cpap. Dr. Rexene Alberts recommends that pt return for a repeat sleep study in order to properly titrate the cpap and ensure a good mask fit. Pt is agreeable to returning for a titration study. I advised pt that our sleep lab will file with pt's insurance and call pt to schedule the sleep study when we hear back from the pt's insurance regarding coverage of this sleep study. Pt verbalized understanding of results. Pt had no questions at this time but was encouraged to call back if questions arise.

## 2017-12-27 NOTE — Procedures (Signed)
448PATIENT'S NAME: Jose Duarte, Jose Duarte DOB:      [birth year]      MR#:    [id number]     DATE OF RECORDING: 12/20/2017 REFERRING M.D.:  Sherald Barge, NP Study Performed:   Baseline Polysomnogram HISTORY: 77 year old man with a history of hypertension, hyperlipidemia, stroke, right bundle branch block, ischemic cardiomyopathy with history of MI and stent placement, diabetes with neur), diverticulosis, reflux disease and mildly overweight state, who reports snoring and excessive daytime somnolence and a prior diagnosis of OSA. The patient endorsed the Epworth Sleepiness Scale at 12 points. The patient's weight 194 pounds with a height of 71 (inches), resulting in a BMI of 27.2 kg/m2. The patient's neck circumference measured 17.25 inches.  CURRENT MEDICATIONS: Tylenol, Plavix, Flonase, Amaryl, Cozaar, Metformin, Nitrostat, Protonix, Crestor   PROCEDURE:  This is a multichannel digital polysomnogram utilizing the Somnostar 11.2 system.  Electrodes and sensors were applied and monitored per AASM Specifications.   EEG, EOG, Chin and Limb EMG, were sampled at 200 Hz.  ECG, Snore and Nasal Pressure, Thermal Airflow, Respiratory Effort, CPAP Flow and Pressure, Oximetry was sampled at 50 Hz. Digital video and audio were recorded.      BASELINE STUDY  Lights Out was at 21:15 and Lights On at 05:01.  Total recording time (TRT) was 466 minutes, with a total sleep time (TST) of  393 minutes.   The patient's sleep latency was 26.5 minutes.  REM latency was 32.5 minutes, which is reduced. The sleep efficiency was 84.3 %.     SLEEP ARCHITECTURE: WASO (Wake after sleep onset) was 58.5 minutes with moderate sleep fragmentation noted.  There were 67.5 minutes in Stage N1, 181.5 minutes Stage N2, 50 minutes Stage N3 and 94 minutes in Stage REM.  The percentage of Stage N1 was 17.2%, which is increased, Stage N2 was 46.2%, which is normal, Stage N3 was 12.7% and Stage R (REM sleep) was 23.9%, which is normal. The  arousals were noted as: 28 were spontaneous, 28 were associated with PLMs, 68 were associated with respiratory events.  RESPIRATORY ANALYSIS:  There were a total of 136 respiratory events:  0 obstructive apneas, 10 central apneas and 102 mixed apneas with a total of 112 apneas and an apnea index (AI) of 17.1 /hour. There were 24 hypopneas with a hypopnea index of 3.7 /hour. The patient also had 0 respiratory event related arousals (RERAs).      The total APNEA/HYPOPNEA INDEX (AHI) was 20.8/hour and the total RESPIRATORY DISTURBANCE INDEX was 0. 20.8 /hour.  42 events occurred in REM sleep and 100 events in NREM. The REM AHI was 26.8/hour, versus a non-REM AHI of 18.9. The patient spent 77 minutes of total sleep time in the supine position and 316 minutes in non-supine. The supine AHI was 66.3 versus a non-supine AHI of 9.7.  OXYGEN SATURATION & C02:  The Wake baseline 02 saturation was 94%, with the lowest being 75%. Time spent below 89% saturation equaled 80 minutes.  PERIODIC LIMB MOVEMENTS: The patient had a total of 65 Periodic Limb Movements.  The Periodic Limb Movement (PLM) index was 9.9 and the PLM Arousal index was 4.3/hour.  Audio and video analysis did not show any abnormal or unusual movements, behaviors, phonations or vocalizations. The patient took no bathroom breaks. Moderate snoring was noted. The EKG was in keeping with normal sinus rhythm (NSR).  Post-study, the patient indicated that sleep was worse than usual.   IMPRESSION:  1. Obstructive Sleep Apnea (OSA) 2. Dysfunctions associated  with sleep stages or arousal from sleep  RECOMMENDATIONS:  1. This study demonstrates moderate to severe obstructive sleep apnea, with evidence of mixed sleep apnea, with a total AHI of 20.8/hour, REM AHI of 26.8/hour, supine AHI of 66.3/hour and O2 nadir of 75%. Treatment with positive airway pressure in the form of CPAP is recommended. This will require a full night titration study to optimize  therapy. Other treatment options may include avoidance of supine sleep position along with weight loss, upper airway or jaw surgery in selected patients or the use of an oral appliance in certain patients. ENT evaluation and/or consultation with a maxillofacial surgeon or dentist may be feasible in some instances.    2. Please note that untreated obstructive sleep apnea may carry additional perioperative morbidity. Patients with significant obstructive sleep apnea should receive perioperative PAP therapy and the surgeons and particularly the anesthesiologist should be informed of the diagnosis and the severity of the sleep disordered breathing. 3. This study shows sleep fragmentation and abnormal sleep stage percentages; these are nonspecific findings and per se do not signify an intrinsic sleep disorder or a cause for the patient's sleep-related symptoms. Causes include (but are not limited to) the first night effect of the sleep study, circadian rhythm disturbances, medication effect or an underlying mood disorder or medical problem.  4. The patient should be cautioned not to drive, work at heights, or operate dangerous or heavy equipment when tired or sleepy. Review and reiteration of good sleep hygiene measures should be pursued with any patient. 5. The patient will be seen in follow-up in the sleep clinic at [facility] for discussion of the test results, symptom and treatment compliance review, further management strategies, etc. The referring provider will be notified of the test results.  I certify that I have reviewed the entire raw data recording prior to the issuance of this report in accordance with the Standards of Accreditation of the American Academy of Sleep Medicine (AASM)  Star Age, MD, PhD Diplomat, American Board of Neurology and Sleep Medicine (Neurology and Sleep Medicine)

## 2017-12-27 NOTE — Telephone Encounter (Signed)
-----   Message from Star Age, MD sent at 12/27/2017  3:20 PM EDT ----- Patient referred by Gertie Gowda., seen by me on 11/28/17, diagnostic PSG on 12/20/17.   Please call and notify the patient that the recent sleep study showed moderate to severe obstructive sleep apnea, with evidence of mixed sleep apnea, with a total AHI of 20.8/hour, REM AHI of 26.8/hour, supine AHI of 66.3/hour and O2 nadir of 75%. I recommend treatment for this in the form of CPAP. This will require a repeat sleep study for proper titration and mask fitting and correct monitoring of the oxygen saturations. Please explain to patient. I have placed an order in the chart. Thanks.  Star Age, MD, PhD [facility])

## 2017-12-27 NOTE — Progress Notes (Signed)
Patient referred by Gertie Gowda., seen by me on 11/28/17, diagnostic PSG on 12/20/17.   Please call and notify the patient that the recent sleep study showed moderate to severe obstructive sleep apnea, with evidence of mixed sleep apnea, with a total AHI of 20.8/hour, REM AHI of 26.8/hour, supine AHI of 66.3/hour and O2 nadir of 75%. I recommend treatment for this in the form of CPAP. This will require a repeat sleep study for proper titration and mask fitting and correct monitoring of the oxygen saturations. Please explain to patient. I have placed an order in the chart. Thanks.  Star Age, MD, PhD [facility])

## 2017-12-27 NOTE — Progress Notes (Signed)
Carelink Summary Report / Loop Recorder 

## 2017-12-27 NOTE — Addendum Note (Signed)
Addended by: Star Age on: 12/27/2017 03:20 PM   Modules accepted: Orders

## 2018-01-02 LAB — CUP PACEART REMOTE DEVICE CHECK
Implantable Pulse Generator Implant Date: 20180927
MDC IDC SESS DTM: 20190606163916

## 2018-01-05 ENCOUNTER — Other Ambulatory Visit: Payer: Self-pay | Admitting: Internal Medicine

## 2018-01-11 ENCOUNTER — Ambulatory Visit (INDEPENDENT_AMBULATORY_CARE_PROVIDER_SITE_OTHER): Payer: Medicare HMO | Admitting: Neurology

## 2018-01-11 DIAGNOSIS — R351 Nocturia: Secondary | ICD-10-CM

## 2018-01-11 DIAGNOSIS — E663 Overweight: Secondary | ICD-10-CM

## 2018-01-11 DIAGNOSIS — R519 Headache, unspecified: Secondary | ICD-10-CM

## 2018-01-11 DIAGNOSIS — Z820 Family history of epilepsy and other diseases of the nervous system: Secondary | ICD-10-CM

## 2018-01-11 DIAGNOSIS — R51 Headache: Secondary | ICD-10-CM

## 2018-01-11 DIAGNOSIS — G4733 Obstructive sleep apnea (adult) (pediatric): Secondary | ICD-10-CM

## 2018-01-11 DIAGNOSIS — G4720 Circadian rhythm sleep disorder, unspecified type: Secondary | ICD-10-CM

## 2018-01-11 DIAGNOSIS — Z8673 Personal history of transient ischemic attack (TIA), and cerebral infarction without residual deficits: Secondary | ICD-10-CM

## 2018-01-11 DIAGNOSIS — G4719 Other hypersomnia: Secondary | ICD-10-CM

## 2018-01-11 DIAGNOSIS — Z9861 Coronary angioplasty status: Secondary | ICD-10-CM

## 2018-01-13 ENCOUNTER — Telehealth: Payer: Self-pay

## 2018-01-13 ENCOUNTER — Other Ambulatory Visit: Payer: Self-pay | Admitting: Cardiovascular Disease

## 2018-01-13 NOTE — Procedures (Signed)
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 His baseline PSG from 12/20/2017 showed a total AHI of 20.8/h and low spo2 of 75%. The patient endorsed the Epworth Sleepiness Scale at 12 points. The patient's weight 194 pounds with a height of 71 (inches), resulting in a BMI of 27.2 kg/m2. The patient's neck circumference measured 17 inches.  CURRENT MEDICATIONS: Tylenol, Plavix, Flonase, Amaryl, Cozaar, Metformin, Nitrostat, Protonix, Crestor   PROCEDURE:  This is a multichannel digital polysomnogram utilizing the SomnoStar 11.2 system.  Electrodes and sensors were applied and monitored per AASM Specifications.   EEG, EOG, Chin and Limb EMG, were sampled at 200 Hz.  ECG, Snore and Nasal Pressure, Thermal Airflow, Respiratory Effort, CPAP Flow and Pressure, Oximetry was sampled at 50 Hz. Digital video and audio were recorded.      The patient was fitted with large P10 nasal pillows. CPAP was initiated at 5 cmH20 with heated humidity per AASM standards and pressure was advanced to 13 cmH20 because of hypopneas, apneas and desaturations.  At a PAP pressure of 13 cmH20, there was a reduction of the AHI to 0/hour with brief supine REM sleep achieved and O2 nadir of 90%.  Lights Out was at 22:27 and Lights On at 04:37. Total recording time (TRT) was 370.5 minutes, with a total sleep time (TST) of 291 minutes. The patient's sleep latency was 15.5 minutes. REM latency was 33 minutes, which is reduced. The sleep efficiency was 78.5 %.    SLEEP ARCHITECTURE: WASO (Wake after sleep onset)   was 59.5 minutes with mild to moderate sleep fragmentation noted. There were 36.5 minutes in Stage N1, 144 minutes Stage N2, 59 minutes Stage N3 and 51.5 minutes in Stage REM.  The percentage of Stage N1 was 12.5%, which is increased, Stage N2 was 49.5%, which is normal, Stage N3 was 20.3% and Stage R (REM sleep) was 17.7%, which is near-normal. The arousals were noted as: 37 were spontaneous, 2 were associated with PLMs, 2 were associated with respiratory events.  RESPIRATORY ANALYSIS:  There was a total of 14 respiratory events: 0 obstructive apneas, 0 central apneas and 0 mixed apneas with a total of 0 apneas and an apnea index (AI) of 0 /hour. There were 14 hypopneas with a hypopnea index of 2.9/hour. The patient also had 1 respiratory event related arousals (RERAs).      The total APNEA/HYPOPNEA INDEX  (AHI) was 2.9 /hour and the total RESPIRATORY DISTURBANCE INDEX was 3.1 0./hour  8 events occurred in REM sleep and 6 events in NREM. The REM AHI was 9.3 /hour versus a non-REM AHI of 1.5 0./hour.  The patient spent 36.5 minutes of total sleep time in the supine position and 255 minutes in non-supine. The supine AHI was 6.6, versus a non-supine AHI of 2.4.  OXYGEN SATURATION & C02:  The baseline 02 saturation was 94%, with the lowest being 90%. Time spent below 89% saturation equaled 0 minutes.  PERIODIC LIMB MOVEMENTS:  The patient had a total of 5 Periodic Limb Movements. The Periodic Limb Movement (PLM)  index was 1. and the PLM Arousal index was .4 /hour.  Audio and video analysis did not show any abnormal or unusual movements, behaviors, phonations or vocalizations. The patient took 1 bathroom break. The EKG was in keeping with normal sinus rhythm (NSR).  Post-study, the patient indicated that sleep was worse than usual.   IMPRESSION:   1. Obstructive Sleep Apnea (OSA) 2. Dysfunctions associated with sleep stages or arousal from sleep   RECOMMENDATIONS:   1. This study demonstrates  resolution of the patient's obstructive sleep apnea with CPAP therapy. I will, therefore, start the patient on home CPAP treatment at a pressure of 13 cm via large nasal pillows with heated humidity. The patient should be reminded to be fully compliant with PAP therapy to improve sleep related symptoms and decrease long term cardiovascular risks. The patient should be reminded, that it may take up to 3 months to get fully used to using PAP with all planned sleep. The earlier full compliance is achieved, the better long term compliance tends to be. Please note that untreated obstructive sleep apnea may carry additional perioperative morbidity. Patients with significant obstructive sleep apnea should receive perioperative PAP therapy and the surgeons and particularly the anesthesiologist should be informed of the diagnosis and the severity of the sleep disordered breathing. 2. This study shows sleep fragmentation and abnormal sleep stage percentages; these are nonspecific findings and per se do not signify an intrinsic sleep disorder or a cause for the patient's sleep-related symptoms. Causes include (but are not limited to) the first night effect of the sleep study, circadian rhythm disturbances, medication effect or an underlying mood disorder or medical problem.  3. The patient should be cautioned not to drive, work at heights, or operate dangerous or heavy equipment when tired or sleepy. Review and reiteration of good sleep hygiene measures should be pursued with any patient. 4. The patient will be seen in follow-up in the sleep clinic at [facility] for discussion of the test results, symptom and treatment compliance review, further management strategies, etc. The referring provider will be notified of the test results.   I certify that I have reviewed the entire raw data recording prior to the issuance of this report in accordance with the Standards of Accreditation of the American Academy of Sleep Medicine  (AASM)   Star Age, MD, PhD Diplomat, American Board of Neurology and Sleep Medicine (Neurology and Sleep Medicine)

## 2018-01-13 NOTE — Telephone Encounter (Signed)
-----   Message from Star Age, MD sent at 01/13/2018 11:52 AM EDT ----- Patient referred by Jose Duarte., seen by me on 11/28/17, diagnostic PSG on 12/20/17. Patient had a CPAP titration study on 01/11/18.  Please call and inform patient that I have entered an order for treatment with positive airway pressure (PAP) treatment for obstructive sleep apnea (OSA). He did well during the latest sleep study with CPAP. We will, therefore, arrange for a machine for home use through a DME (durable medical equipment) company of His choice; and I will see the patient back in follow-up in about 10 weeks. Please also explain to the patient that I will be looking out for compliance data, which can be downloaded from the machine (stored on an SD card, that is inserted in the machine) or via remote access through a modem, that is built into the machine. At the time of the followup appointment we will discuss sleep study results and how it is going with PAP treatment at home. Please advise patient to bring His machine at the time of the first FU visit, even though this is cumbersome. Bringing the machine for every visit after that will likely not be needed, but often helps for the first visit to troubleshoot if needed. Please re-enforce the importance of compliance with treatment and the need for [place] to monitor compliance data - often an insurance requirement and actually good feedback for the patient as far as how they are doing.  Also remind patient, that any interim PAP machine or mask issues should be first addressed with the DME company, as they can often help better with technical and mask fit issues. Please ask if patient has a preference regarding DME company.  Please also make sure, the patient has a follow-up appointment with me in about 10 weeks from the setup date, thanks. May see one of our nurse practitioners if needed for proper timing of the FU appointment.  Please fax or rout report to the referring provider. Thanks,    Star Age, MD, PhD [facility])

## 2018-01-13 NOTE — Telephone Encounter (Signed)
I called pt. I advised pt that Dr. Rexene Alberts reviewed their sleep study results and found that pt did well with the cpap during his latest sleep study. Dr. Rexene Alberts recommends that pt start a cpap at home. I reviewed PAP compliance expectations with the pt. Pt is agreeable to starting a CPAP. I advised pt that an order will be sent to a DME, [facility], and [employer] will call the pt within about one week after they file with the pt's insurance. [employer] will show the pt how to use the machine, fit for masks, and troubleshoot the CPAP if needed. A follow up appt was made for insurance purposes with Dr. Rexene Alberts on 04/03/18 at 8:30am. Pt verbalized understanding to arrive 15 minutes early and bring their CPAP. A letter with all of this information in it will be mailed to the pt as a reminder. I verified with the pt that the address we have on file is correct. Pt verbalized understanding of results. Pt had no questions at this time but was encouraged to call back if questions arise.

## 2018-01-13 NOTE — Progress Notes (Signed)
Patient referred by Gertie Gowda., seen by me on 11/28/17, diagnostic PSG on 12/20/17. Patient had a CPAP titration study on 01/11/18.  Please call and inform patient that I have entered an order for treatment with positive airway pressure (PAP) treatment for obstructive sleep apnea (OSA). He did well during the latest sleep study with CPAP. We will, therefore, arrange for a machine for home use through a DME (durable medical equipment) company of His choice; and I will see the patient back in follow-up in about 10 weeks. Please also explain to the patient that I will be looking out for compliance data, which can be downloaded from the machine (stored on an SD card, that is inserted in the machine) or via remote access through a modem, that is built into the machine. At the time of the followup appointment we will discuss sleep study results and how it is going with PAP treatment at home. Please advise patient to bring His machine at the time of the first FU visit, even though this is cumbersome. Bringing the machine for every visit after that will likely not be needed, but often helps for the first visit to troubleshoot if needed. Please re-enforce the importance of compliance with treatment and the need for [place] to monitor compliance data - often an insurance requirement and actually good feedback for the patient as far as how they are doing.  Also remind patient, that any interim PAP machine or mask issues should be first addressed with the DME company, as they can often help better with technical and mask fit issues. Please ask if patient has a preference regarding DME company.  Please also make sure, the patient has a follow-up appointment with me in about 10 weeks from the setup date, thanks. May see one of our nurse practitioners if needed for proper timing of the FU appointment.  Please fax or rout report to the referring provider. Thanks,   Star Age, MD, PhD [facility])

## 2018-01-13 NOTE — Addendum Note (Signed)
Addended by: Star Age on: 01/13/2018 11:52 AM   Modules accepted: Orders

## 2018-01-27 LAB — CUP PACEART REMOTE DEVICE CHECK
Implantable Pulse Generator Implant Date: 20180927
MDC IDC SESS DTM: 20190709173754

## 2018-01-30 ENCOUNTER — Ambulatory Visit (INDEPENDENT_AMBULATORY_CARE_PROVIDER_SITE_OTHER): Payer: Medicare HMO | Admitting: *Deleted

## 2018-01-30 DIAGNOSIS — I639 Cerebral infarction, unspecified: Secondary | ICD-10-CM | POA: Diagnosis not present

## 2018-01-30 NOTE — Progress Notes (Signed)
Carelink Summary Report / Loop Recorder 

## 2018-02-08 ENCOUNTER — Other Ambulatory Visit: Payer: Self-pay | Admitting: Internal Medicine

## 2018-02-27 ENCOUNTER — Ambulatory Visit: Payer: Medicare HMO | Admitting: Adult Health

## 2018-03-02 ENCOUNTER — Other Ambulatory Visit: Payer: Self-pay | Admitting: Internal Medicine

## 2018-03-03 ENCOUNTER — Ambulatory Visit (INDEPENDENT_AMBULATORY_CARE_PROVIDER_SITE_OTHER): Payer: Medicare HMO | Admitting: *Deleted

## 2018-03-03 DIAGNOSIS — I639 Cerebral infarction, unspecified: Secondary | ICD-10-CM

## 2018-03-05 NOTE — Progress Notes (Signed)
Carelink Summary Report / Loop Recorder 

## 2018-03-08 LAB — CUP PACEART REMOTE DEVICE CHECK
Implantable Pulse Generator Implant Date: 20180927
MDC IDC SESS DTM: 20190811173803

## 2018-03-18 LAB — CUP PACEART REMOTE DEVICE CHECK
Date Time Interrogation Session: 20190913181002
MDC IDC PG IMPLANT DT: 20180927

## 2018-03-23 ENCOUNTER — Other Ambulatory Visit: Payer: Self-pay | Admitting: Internal Medicine

## 2018-03-29 ENCOUNTER — Encounter: Payer: Self-pay | Admitting: Neurology

## 2018-04-03 ENCOUNTER — Encounter: Payer: Self-pay | Admitting: Neurology

## 2018-04-03 ENCOUNTER — Telehealth: Payer: Self-pay

## 2018-04-03 ENCOUNTER — Ambulatory Visit: Payer: Medicare HMO | Admitting: Neurology

## 2018-04-03 VITALS — BP 153/79 | HR 61 | Ht 71.0 in | Wt 204.0 lb

## 2018-04-03 DIAGNOSIS — Z9989 Dependence on other enabling machines and devices: Secondary | ICD-10-CM | POA: Diagnosis not present

## 2018-04-03 DIAGNOSIS — G4733 Obstructive sleep apnea (adult) (pediatric): Secondary | ICD-10-CM

## 2018-04-03 NOTE — Telephone Encounter (Signed)
Pt was sent to sleep lab by Dr Rexene Alberts for a mask re-fit.  Pt had appt today and download showed a very high leak(nasal mask) along with pt c/o very dry mouth.  Pt was fitted with a Dreamwear full face mask, medium headgear and large full face cushion.  Pt was tried on multiple pressures to check for leaks. Pt liked this mask very much and stated it was very comfortable.  Pt did mention that he felt his CPAP pressure may be too high but is willing to try ffm to see if it helps.  Pt was given my direct number to call me tomorrow to let me know how mask did and about how the pressure felt.

## 2018-04-03 NOTE — Progress Notes (Signed)
Subjective:    Patient ID: Jose Duarte is a 77 y.o. male.  HPI     Interim history:   Jose Duarte a 77 year old right-handed gentleman with an underlying complex medical history of hypertension, hyperlipidemia, stroke, right bundle branch block, ischemic cardiomyopathy with history of MI and stent placement, diabetes with neuropathy, diverticulosis, reflux disease and overweight state, who presents for follow-up consultation of his obstructive sleep apnea, after sleep study testing and starting CPAP therapy at home. The patient is unaccompanied today. I first met him on 11/28/2017 at the request of Sherald Barge, NP, at which time he reported snoring and excessive daytime somnolence as well as a prior diagnosis of sleep apnea. He was advised to proceed with a sleep study. He had a baseline sleep study, followed by a CPAP titration study. I went over his test results with him in detail today. Baseline sleep study from 12/20/2017 showed a sleep efficiency of 84.3%, sleep latency of 26.5 minutes and REM latency reduced at 32.5 minutes. He had a normal percentage of stage II sleep and a normal percentage of REM sleep. Total AHI was in the moderate range at 20.8 per hour, REM AHI 26.8 per hour, supine AHI 66.3 per hour, average oxygen saturation was 94%, nadir was 75%. He was advised to return for a full night CPAP titration study. He had this on 01/11/2018. Sleep efficiency was 78.5%, sleep latency 15.5 minutes, REM latency 33 minutes, he had a normal percentage of light stage sleep, REM sleep slightly reduced at 17.7%. He was titrated via nasal pillows from 5 cm to 13 cm. On the final pressure his AHI was 0 per hour with brief supine REM sleep achieved an O2 nadir of 90%. Based on his test results I prescribed CPAP therapy for home use at a pressure of 13 cm.  Today, 04/03/2018: I reviewed his CPAP compliance data from 02/28/2018 through 03/29/2018, which is a total of 30 days, during which time  he used his CPAP every night with percent used days greater than 4 hours at 97%, indicating excellent compliance with an average usage of 6 hours and 22 minutes, residual AHI at goal at 1.3 per hour, leak high with the 95th percentile at 49.4 L/m on a pressure of 13 cm with EPR of 3. He reports doing well with CPAP. He sleeps more consolidated, sleep seems to be better quality. Nevertheless, he still has some issues including mouth dryness. His wife has noticed noisy noisiness from the machine, likely from the air leaking. Otherwise, he is fully compliant, he is motivated to continue with treatment. He canceled an appointment in the interim with Janett Billow as he felt he did not need it.  Previously:   11/28/2017: (He) reports snoring and excessive daytime somnolence. He had a sleep study in the 70s. He has never been on CPAP therapy, but was told to start CPAP therapy, could not afford it at the time. I reviewed your office from 08/26/2017. His Epworth sleepiness score is 12 out of 24, fatigue score is 34 out of 63. He is a nonsmoker and does not utilize alcohol, he drinks caffeine in the form of coffee, 4 cups per day and occasional soda or tea. He has a loop recorder in place.  He is moving to a new house soon, would like to settle in before doing a sleep study. He retired from working at the [employer] for 46 years. He has nocturia about once or twice per average night, he has  had occasional morning headaches. His wife reports to him that he still has apneas, not as bad as it used to be. His bedtime is around 9 and rise time often around 5. He has 1 dog, not in the bedroom with them typically. He has 2 sons, one has sleep apnea and uses a CPAP machine.  His Past Medical History Is Significant For: Past Medical History:  Diagnosis Date  . Arthritis   . CAD S/P percutaneous coronary angioplasty - PCI RCA cardiologist-  dr Johnsie Cancel   03-01-2013 Promus Premier DES 3.0 mm x 16 mm - post-dilated to 3.2 mm -  Dr. Claiborne Billings   . Cataract   . Diverticulosis of colon   . Family history of adverse reaction to anesthesia    brother --  ponv  . GERD (gastroesophageal reflux disease)   . History of adenomatous polyp of colon   . History of CVA (cerebrovascular accident)    01-06-2009  right posterior temporal and occipital lobe  . History of nonmelanoma skin cancer    EXCISION OF EAR  . History of ST elevation myocardial infarction (STEMI) 03/01/2013   inferolateral s/p  DES to RI  . History of transient ischemic attack (TIA)    2006  approx  . HTN (hypertension), benign 03/02/2013  . Hyperlipidemia   . Hypertension   . Incomplete right bundle branch block   . Ischemic cardiomyopathy    ef 45-50% per last echo 2016 and myview stress ef 52% 2016  . Myocardial infarction ([place])   . Nocturia   . OSA (obstructive sleep apnea)    study done  approx 2006--  no cpap  . Peripheral neuropathy   . Phimosis   . PONV (postoperative nausea and vomiting)   . S/P drug eluting coronary stent placement 03/01/2013   x1 to [place]  . Stroke [facility])    2 "mini" strokes, no residual  . Type 2 diabetes mellitus ([place])   . Wears glasses     His Past Surgical History Is Significant For: Past Surgical History:  Procedure Laterality Date  . CARDIAC CATHETERIZATION  09-18-2004   [facility]   no significant cad, normal LVF, ef 60%  . CARDIOVASCULAR STRESS TEST  03-24-2015   dr Johnsie Cancel   Low risk nuclear study w/ no ischemia;  (previous known lateral wall MI) non-reversible large moderate severity defect in the basal and mid inferolateral , basal and mid anterolateral ;  nuclear stress ef 52%  . CERVICAL SPINE SURGERY  1980's  . CIRCUMCISION N/A 03/26/2016   Procedure: CIRCUMCISION ADULT;  Surgeon: Kathie Rhodes, MD;  Location: [facility];  Service: Urology;  Laterality: N/A;  . COLON SURGERY    . DIAGNOSTIC LAPAROSCOPY    . EXPLORATORY LAPAROTOMY/  RIGHT COLECTOMY/  DRAINAGE INTRA-ABDOMINAL SUBPHRENIC ABSCESS AND  EXCISION INFLAMMATORY MESENTERY ABSCESS WITHIN TRANSVERSE COLON  09-01-2009   dr Rise Patience  . HERNIA REPAIR  1970's  . INGUINAL HERNIA REPAIR Left 09/18/2012   Procedure: LAPAROSCOPIC INGUINAL HERNIA;  Surgeon: Ralene Ok, MD;  Location: [facility];  Service: General;  Laterality: Left;  . INGUINAL HERNIA REPAIR Left 07/03/2014   Procedure: LEFT INGUINAL HERNIA REPAIR WITH MESH;  Surgeon: Pedro Earls, MD;  Location: [facility];  Service: General;  Laterality: Left;  . INSERTION OF MESH Left 09/18/2012   Procedure: INSERTION OF MESH;  Surgeon: Ralene Ok, MD;  Location: [facility];  Service: General;  Laterality: Left;  . LAPAROSCOPIC CHOLECYSTECTOMY  11-22-5   dr Rosana Hoes  .  LAPAROSCOPIC LYSIS OF ADHESIONS N/A 09/18/2012   Procedure: LAPAROSCOPIC LYSIS OF ADHESIONS;  Surgeon: Ralene Ok, MD;  Location: [facility];  Service: General;  Laterality: N/A;  . LEFT HEART CATHETERIZATION WITH CORONARY ANGIOGRAM N/A 03/01/2013   Procedure: LEFT HEART CATHETERIZATION WITH CORONARY ANGIOGRAM;  Surgeon: Troy Sine, MD;  Location: [facility];  Service: Cardiovascular;  Laterality: N/A;  PTCA, thrombectomy, and DES stent to right proximal ramus intermediate;  diffuse 30-35% RCA,  diffuse 50% LAD;  preserved LVF w/ moderate anterolateral hypocontractility  . LOOP RECORDER INSERTION N/A 03/17/2017   Procedure: LOOP RECORDER INSERTION;  Surgeon: Deboraha Sprang, MD;  Location: [facility];  Service: Cardiovascular;  Laterality: N/A;  . TRANSTHORACIC ECHOCARDIOGRAM  03/24/2015   basal, mid inferor and inferolateral hypokinesis, mild focal basal LVH, ef 30-86%, grade 1 diastolic dysfunction/  mild AV sclerosis without stenosis/  mild AR, MR, and TR/  mild LAE  . VENTRAL HERNIA REPAIR N/A 09/18/2012   Procedure: LAPAROSCOPIC VENTRAL HERNIA;  Surgeon: Ralene Ok, MD;  Location: [facility];  Service: General;  Laterality: N/A;    His Family History Is Significant For: Family History  Problem Relation Age  of Onset  . Cancer Sister        lung    His Social History Is Significant For: Social History   Socioeconomic History  . Marital status: Married    Spouse name: Not on file  . Number of children: Not on file  . Years of education: Not on file  . Highest education level: Not on file  Occupational History  . Not on file  Social Needs  . Financial resource strain: Not on file  . Food insecurity:    Worry: Not on file    Inability: Not on file  . Transportation needs:    Medical: Not on file    Non-medical: Not on file  Tobacco Use  . Smoking status: Never Smoker  . Smokeless tobacco: Former Systems developer    Types: Chew  . Tobacco comment: quit 2003  Substance and Sexual Activity  . Alcohol use: Yes    Alcohol/week: 1.0 standard drinks    Types: 1 Glasses of wine per week    Comment: occasionally  . Drug use: No  . Sexual activity: Not on file  Lifestyle  . Physical activity:    Days per week: Not on file    Minutes per session: Not on file  . Stress: Not on file  Relationships  . Social connections:    Talks on phone: Not on file    Gets together: Not on file    Attends religious service: Not on file    Active member of club or organization: Not on file    Attends meetings of clubs or organizations: Not on file    Relationship status: Not on file  Other Topics Concern  . Not on file  Social History Narrative  . Not on file    His Allergies Are:  Allergies  Allergen Reactions  . Penicillins Anaphylaxis    Knots on head; tongue swelling Has patient had a PCN reaction causing immediate rash, facial/tongue/throat swelling, SOB or lightheadedness with hypotension: Yes Has patient had a PCN reaction causing severe rash involving mucus membranes or skin necrosis:No Has patient had a PCN reaction that required hospitalization: Unknown Has patient had a PCN reaction occurring within the last 10 years: No If all of the above answers are "NO", then may proceed with  Cephalosporin use.   :  His Current Medications Are:  Outpatient Encounter Medications as of 04/03/2018  Medication Sig  . acetaminophen (TYLENOL) 500 MG tablet Take 1,000 mg by mouth every 6 (six) hours as needed for mild pain or headache.  . clopidogrel (PLAVIX) 75 MG tablet Take 1 tablet (75 mg total) daily by mouth.  . fluticasone (FLONASE) 50 MCG/ACT nasal spray Place 2 sprays into the nose daily as needed for allergies.  Marland Kitchen glimepiride (AMARYL) 4 MG tablet Take 4 mg by mouth daily.  Marland Kitchen losartan (COZAAR) 50 MG tablet TAKE 1 TABLET BY MOUTH ONCE DAILY  . Melatonin 5 MG TABS Take 5 mg by mouth at bedtime as needed.  . metFORMIN (GLUCOPHAGE) 500 MG tablet Take 500 mg by mouth 2 (two) times daily with a meal.  . nitroGLYCERIN (NITROSTAT) 0.4 MG SL tablet DISSOLVE ONE TABLET UNDER THE TONGUE EVERY 5 MINUTES AS NEEDED FOR CHEST PAIN.  DO NOT EXCEED A TOTAL OF 3 DOSES IN 15 MINUTES  . pantoprazole (PROTONIX) 40 MG tablet Take 40 mg by mouth daily.  . rosuvastatin (CRESTOR) 10 MG tablet Take 1 tablet (10 mg total) by mouth daily.   No facility-administered encounter medications on file as of 04/03/2018.   :  Review of Systems:  Out of a complete 14 point review of systems, all are reviewed and negative with the exception of these symptoms as listed below: Review of Systems  Neurological:       Pt presents today for follow up. Pt reports that he is doing well.    Objective:  Neurological Exam  Physical Exam Physical Examination:   Vitals:   04/03/18 0814  BP: (!) 153/79  Pulse: 61   General Examination: The patient is a very pleasant 77 y.o. male in no acute distress. He appears well-developed and well-nourished and well groomed.   HEENT: Normocephalic, atraumatic, pupils are equal, round and reactive to light and accommodation. Extraocular tracking is good without limitation to gaze excursion or nystagmus noted. He's wearing corrective eyeglasses. Normal smooth pursuit is noted.  Hearing is impaired (has hearing aids, but does not use them often). Face is symmetric with normal facial animation and normal facial sensation. Speech is clear with no dysarthria noted. There is no hypophonia. There is no lip, neck/head, jaw or voice tremor. Neck with FROM. Oropharynx exam reveals: mild mouth dryness, adequate dental hygiene and mild airway crowding, due to wider uvula and smaller airway entry. Mallampati is class II. Tongue protrudes centrally and palate elevates symmetrically.   Chest: Clear to auscultation without wheezing, rhonchi or crackles noted.  Heart: S1+S2+0, regular and normal without murmurs, rubs or gallops noted.   Abdomen: Soft, non-tender and non-distended with normal bowel sounds appreciated on auscultation.  Extremities: There is no pitting edema in the distal lower extremities bilaterally.  Skin: Warm and dry without trophic changes noted. There are varicose veins in the L>R calves.  Musculoskeletal: exam reveals no obvious joint deformities, tenderness or joint swelling or erythema.   Neurologically:  Mental status: The patient is awake, alert and oriented in all 4 spheres. His immediate and remote memory, attention, language skills and fund of knowledge are appropriate. There is no evidence of aphasia, agnosia, apraxia or anomia. Speech is clear with normal prosody and enunciation. Thought process is linear. Mood is normal and affect is normal.  Cranial nerves II - XII are as described above under HEENT exam. In addition: shoulder shrug is normal with equal shoulder height noted. Motor exam: Normal bulk, strength and  tone is noted. There is no tremor. Romberg is negative, except for mild sway. Fine motor skills and coordination: grossly intact.   Cerebellar testing: No dysmetria or intention tremor. There is no truncal or gait ataxia.  Sensory exam: intact to light touch in the upper and lower extremities.  Gait, station and balance: He stands  easily. No veering to one side is noted. No leaning to one side is noted. Posture is age-appropriate to mildly stooped in the lower back, and stance is narrow based. Gait shows normal stride length and normal pace. No problems turning are noted.   Assessment and Plan:  In summary, DARLY FAILS is a very pleasant 77 year old male with an underlying complex medical history of hypertension, hyperlipidemia, stroke, right bundle branch block, ischemic cardiomyopathy with history of MI and stent placement, diabetes with neur), diverticulosis, reflux disease and mildly overweight state, who presents for follow-up consultation of his moderate to severe obstructive sleep apnea. He had sleep study testing in early July 2019, followed by a CPAP titration study in late July 2019. He did fairly well with CPAP of 13 cm. He is fully compliant with treatment and highly commended for this. He has benefited from treatment so far in that his sleep consolidation sleep quality her better. He is motivated to continue with treatment. He is experiencing mouth dryness and air escapes quite a bit most likely because of mouth opening. He's using nasal pillows. He is advised to stop by on his way out in our sleep lab to see if we can fit him with a better mask option, likely full facemask. He is agreeable to trying this. He is advised to continue with full compliance and advised to follow-up routinely with Janett Billow in 6 months, sooner if needed. From there, he can likely be seen in follow-up in sleep clinic yearly. I answered all his questions today, we reviewed both sleep study results in detail and also his compliance data together. I answered all his questions today and he was in agreement.  I spent 25 minutes in total face-to-face time with the patient, more than 50% of which was spent in counseling and coordination of care, reviewing test results, reviewing medication and discussing or reviewing the diagnosis of OSA, its  prognosis and treatment options. Pertinent laboratory and imaging test results that were available during this visit with the patient were reviewed by me and considered in my medical decision making (see chart for details).

## 2018-04-03 NOTE — Patient Instructions (Addendum)
Please continue using your CPAP regularly. While your insurance requires that you use CPAP at least 4 hours each night on 70% of the nights, I recommend, that you not skip any nights and use it throughout the night if you can. Getting used to CPAP and staying with the treatment long term does take time and patience and discipline. Untreated obstructive sleep apnea when it is moderate to severe can have an adverse impact on cardiovascular health and raise her risk for heart disease, arrhythmias, hypertension, congestive heart failure, stroke and diabetes. Untreated obstructive sleep apnea causes sleep disruption, nonrestorative sleep, and sleep deprivation. This can have an impact on your day to day functioning and cause daytime sleepiness and impairment of cognitive function, memory loss, mood disturbance, and problems focussing. Using CPAP regularly can improve these symptoms.  Keep up the good work! We will see you back in 6 months for sleep apnea check up.   You can see Janett Billow in 6 months routinely.   Please stop by the sleep lab on your way out, ask for Robin or Meagan.

## 2018-04-05 ENCOUNTER — Ambulatory Visit (INDEPENDENT_AMBULATORY_CARE_PROVIDER_SITE_OTHER): Payer: Medicare HMO | Admitting: *Deleted

## 2018-04-05 DIAGNOSIS — I639 Cerebral infarction, unspecified: Secondary | ICD-10-CM

## 2018-04-06 NOTE — Progress Notes (Signed)
Carelink Summary Report / Loop Recorder 

## 2018-04-17 ENCOUNTER — Other Ambulatory Visit: Payer: Self-pay

## 2018-04-17 DIAGNOSIS — Z9861 Coronary angioplasty status: Secondary | ICD-10-CM

## 2018-04-17 DIAGNOSIS — E785 Hyperlipidemia, unspecified: Secondary | ICD-10-CM

## 2018-04-17 DIAGNOSIS — I2510 Atherosclerotic heart disease of native coronary artery without angina pectoris: Secondary | ICD-10-CM

## 2018-04-17 LAB — CUP PACEART REMOTE DEVICE CHECK
Date Time Interrogation Session: 20191016183647
MDC IDC PG IMPLANT DT: 20180927

## 2018-04-17 MED ORDER — ROSUVASTATIN CALCIUM 10 MG PO TABS: 10.0000 mg | ORAL_TABLET | Freq: Every day | ORAL | 1 refills | Status: DC

## 2018-04-18 NOTE — Progress Notes (Signed)
Patient Care Team: Leonard Downing, MD as PCP - General (Family Medicine)   HPI  Jose Duarte is a 77 y.o. male Seen in followup for ILR implanted for Cryptogenic Stroke  CAD with History of stenting acutely of an occluded ramus 9/14.    DATE TEST    10/16    Myoview   EF 52 % Old lateral wall MI   10/16    Echo   EF 45-50 %          The patient denies chest pain, shortness of breath, nocturnal dyspnea, orthopnea or peripheral edema.  There have been no palpitations, lightheadedness or syncope.    Thromboembolic risk factors ( age - -2, HTN-1, TIA/CVA-2, DM-1, Vasc disease -1) for a CHADSVASc Score of 7   Records and Results Reviewed   Past Medical History:  Diagnosis Date  . Arthritis   . CAD S/P percutaneous coronary angioplasty - PCI RCA cardiologist-  dr Johnsie Cancel   03-01-2013 Promus Premier DES 3.0 mm x 16 mm - post-dilated to 3.2 mm - Dr. Claiborne Billings   . Cataract   . Diverticulosis of colon   . Family history of adverse reaction to anesthesia    brother --  ponv  . GERD (gastroesophageal reflux disease)   . History of adenomatous polyp of colon   . History of CVA (cerebrovascular accident)    01-06-2009  right posterior temporal and occipital lobe  . History of nonmelanoma skin cancer    EXCISION OF EAR  . History of ST elevation myocardial infarction (STEMI) 03/01/2013   inferolateral s/p  DES to RI  . History of transient ischemic attack (TIA)    2006  approx  . HTN (hypertension), benign 03/02/2013  . Hyperlipidemia   . Hypertension   . Incomplete right bundle branch block   . Ischemic cardiomyopathy    ef 45-50% per last echo 2016 and myview stress ef 52% 2016  . Myocardial infarction ([place])   . Nocturia   . OSA (obstructive sleep apnea)    study done  approx 2006--  no cpap  . Peripheral neuropathy   . Phimosis   . PONV (postoperative nausea and vomiting)   . S/P drug eluting coronary stent placement 03/01/2013   x1 to Ramus    . Stroke [facility])    2 "mini" strokes, no residual  . Type 2 diabetes mellitus ([place])   . Wears glasses     Past Surgical History:  Procedure Laterality Date  . CARDIAC CATHETERIZATION  09-18-2004   [facility]   no significant cad, normal LVF, ef 60%  . CARDIOVASCULAR STRESS TEST  03-24-2015   dr Johnsie Cancel   Low risk nuclear study w/ no ischemia;  (previous known lateral wall MI) non-reversible large moderate severity defect in the basal and mid inferolateral , basal and mid anterolateral ;  nuclear stress ef 52%  . CERVICAL SPINE SURGERY  1980's  . CIRCUMCISION N/A 03/26/2016   Procedure: CIRCUMCISION ADULT;  Surgeon: Kathie Rhodes, MD;  Location: [facility];  Service: Urology;  Laterality: N/A;  . COLON SURGERY    . DIAGNOSTIC LAPAROSCOPY    . EXPLORATORY LAPAROTOMY/  RIGHT COLECTOMY/  DRAINAGE INTRA-ABDOMINAL SUBPHRENIC ABSCESS AND EXCISION INFLAMMATORY MESENTERY ABSCESS WITHIN TRANSVERSE COLON  09-01-2009   dr Rise Patience  . HERNIA REPAIR  1970's  . INGUINAL HERNIA REPAIR Left 09/18/2012   Procedure: LAPAROSCOPIC INGUINAL HERNIA;  Surgeon: Ralene Ok, MD;  Location: [facility];  Service: General;  Laterality: Left;  . INGUINAL HERNIA REPAIR Left 07/03/2014   Procedure: LEFT INGUINAL HERNIA REPAIR WITH MESH;  Surgeon: Pedro Earls, MD;  Location: [facility];  Service: General;  Laterality: Left;  . INSERTION OF MESH Left 09/18/2012   Procedure: INSERTION OF MESH;  Surgeon: Ralene Ok, MD;  Location: [facility];  Service: General;  Laterality: Left;  . LAPAROSCOPIC CHOLECYSTECTOMY  11-22-5   dr Rosana Hoes  . LAPAROSCOPIC LYSIS OF ADHESIONS N/A 09/18/2012   Procedure: LAPAROSCOPIC LYSIS OF ADHESIONS;  Surgeon: Ralene Ok, MD;  Location: [facility];  Service: General;  Laterality: N/A;  . LEFT HEART CATHETERIZATION WITH CORONARY ANGIOGRAM N/A 03/01/2013   Procedure: LEFT HEART CATHETERIZATION WITH CORONARY ANGIOGRAM;  Surgeon: Troy Sine, MD;  Location: [facility] CATH LAB;  Service:  Cardiovascular;  Laterality: N/A;  PTCA, thrombectomy, and DES stent to right proximal ramus intermediate;  diffuse 30-35% RCA,  diffuse 50% LAD;  preserved LVF w/ moderate anterolateral hypocontractility  . LOOP RECORDER INSERTION N/A 03/17/2017   Procedure: LOOP RECORDER INSERTION;  Surgeon: Deboraha Sprang, MD;  Location: [place] CV LAB;  Service: Cardiovascular;  Laterality: N/A;  . TRANSTHORACIC ECHOCARDIOGRAM  03/24/2015   basal, mid inferor and inferolateral hypokinesis, mild focal basal LVH, ef 65-78%, grade 1 diastolic dysfunction/  mild AV sclerosis without stenosis/  mild AR, MR, and TR/  mild LAE  . VENTRAL HERNIA REPAIR N/A 09/18/2012   Procedure: LAPAROSCOPIC VENTRAL HERNIA;  Surgeon: Ralene Ok, MD;  Location: [facility];  Service: General;  Laterality: N/A;    Current Meds  Medication Sig  . acetaminophen (TYLENOL) 500 MG tablet Take 1,000 mg by mouth every 6 (six) hours as needed for mild pain or headache.  . clopidogrel (PLAVIX) 75 MG tablet Take 1 tablet (75 mg total) daily by mouth.  . fluticasone (FLONASE) 50 MCG/ACT nasal spray Place 2 sprays into the nose daily as needed for allergies.  Marland Kitchen glimepiride (AMARYL) 4 MG tablet Take 4 mg by mouth daily.  Marland Kitchen losartan (COZAAR) 50 MG tablet TAKE 1 TABLET BY MOUTH ONCE DAILY  . Melatonin 5 MG TABS Take 5 mg by mouth at bedtime as needed.  . metFORMIN (GLUCOPHAGE) 500 MG tablet Take 500 mg by mouth 2 (two) times daily with a meal.  . nitroGLYCERIN (NITROSTAT) 0.4 MG SL tablet DISSOLVE ONE TABLET UNDER THE TONGUE EVERY 5 MINUTES AS NEEDED FOR CHEST PAIN.  DO NOT EXCEED A TOTAL OF 3 DOSES IN 15 MINUTES  . pantoprazole (PROTONIX) 40 MG tablet Take 40 mg by mouth daily.  . rosuvastatin (CRESTOR) 10 MG tablet Take 1 tablet (10 mg total) by mouth daily.    Allergies  Allergen Reactions  . Penicillins Anaphylaxis    Knots on head; tongue swelling Has patient had a PCN reaction causing immediate rash, facial/tongue/throat swelling,  SOB or lightheadedness with hypotension: Yes Has patient had a PCN reaction causing severe rash involving mucus membranes or skin necrosis:No Has patient had a PCN reaction that required hospitalization: Unknown Has patient had a PCN reaction occurring within the last 10 years: No If all of the above answers are "NO", then may proceed with Cephalosporin use.       Review of Systems negative except from HPI and PMH  Physical Exam BP (!) 148/90   Pulse (!) 54   Ht 5\' 11"  (1.803 m)   Wt 203 lb 3.2 oz (92.2 kg)   SpO2 98%   BMI 28.34 kg/m  Well developed and nourished in no  acute distress HENT normal Neck supple with JVP-flat Clear Regular rate and rhythm, no murmurs or gallops Abd-soft with active BS No Clubbing cyanosis edema Skin-warm and dry A & Oriented  Grossly normal sensory and motor function  ECG sinus 54 17/16/48 RBBB/LAFB  Assessment and  Plan  Cryptogenic Stroke  Implantable loop recorder  Ischemic cardiomyopathy  BP elevated   No intercurrent atrial fibrillation or flutter  Without symptoms of ischemia  Concern re freq of transimissions  BP is elevated at home he says at 130-140-ish but checked irregularly.  I will have him check it regularly and patient have his follow-up visit with Dr. Raynaldo Opitz  Will seek to figure out a way to review transmission policy    We spent more than 50% of our >25 min visit in face to face counseling regarding the above     Current medicines are reviewed at length with the patient today .  The patient does not  have concerns regarding medicines.

## 2018-04-19 ENCOUNTER — Ambulatory Visit: Payer: Medicare HMO | Admitting: Internal Medicine

## 2018-04-19 ENCOUNTER — Encounter: Payer: Self-pay | Admitting: Internal Medicine

## 2018-04-19 VITALS — BP 148/90 | HR 54 | Ht 71.0 in | Wt 203.2 lb

## 2018-04-19 DIAGNOSIS — I639 Cerebral infarction, unspecified: Secondary | ICD-10-CM | POA: Diagnosis not present

## 2018-04-19 DIAGNOSIS — I255 Ischemic cardiomyopathy: Secondary | ICD-10-CM

## 2018-04-19 LAB — CUP PACEART INCLINIC DEVICE CHECK
Date Time Interrogation Session: 20191030125943
Implantable Pulse Generator Implant Date: 20180927

## 2018-04-19 NOTE — Patient Instructions (Addendum)
Medication Instructions:  Your physician recommends that you continue on your current medications as directed. Please refer to the Current Medication list given to you today.  If you need a refill on your cardiac medications before your next appointment, please call your pharmacy.   Lab work: None ordered  Testing/Procedures: None ordered  Follow-Up: You will continue with monthly monitoring checks of your loop recorder.  Your next scheduled remote date is 05/08/2018.  No follow up is needed at this time with Dr. Caryl Comes.  He will see you on an as needed basis.  Thank you for choosing [facility]!!

## 2018-05-08 ENCOUNTER — Ambulatory Visit (INDEPENDENT_AMBULATORY_CARE_PROVIDER_SITE_OTHER): Payer: Medicare HMO

## 2018-05-08 DIAGNOSIS — I255 Ischemic cardiomyopathy: Secondary | ICD-10-CM | POA: Diagnosis not present

## 2018-05-08 DIAGNOSIS — I639 Cerebral infarction, unspecified: Secondary | ICD-10-CM

## 2018-05-09 NOTE — Progress Notes (Signed)
Carelink Summary Report / Loop Recorder 

## 2018-05-29 ENCOUNTER — Other Ambulatory Visit: Payer: Self-pay | Admitting: Cardiovascular Disease

## 2018-06-07 NOTE — Progress Notes (Signed)
Patient ID: Jose Duarte, male   DOB: 10-11-1940, 77 y.o.   MRN: [id number]   77 y.o.  self referral. Previously seen by [name]. Did not like [facility] practice. Wanted to change to [place]. On 03/01/13 He was taken acutely to the cath lab which revealed a totally occluded ramus branch. This was stented with a 3.0x16 mm Promus Premier DES stent postdilated 3.2 mm with 100% occlusion reduced to 0% and resumption of TIMI 3 flow.   03/06/14  ETT normal HTN response and PVCls BP meds adjusted 03/24/15 Myovue old scar no ischemia EF 52%  Medical Rx recommended  Echo 03/24/15  EF a bit better than immediate post PCI/Stent EF 45-50%   He had multiple concerns today Some SSCP not clear if its epigastric or heart Crestor causing stiffness Lipitor was worse  Discussed Entresto for his ischemic DCM Concerned about cost  ROS: Denies fever, malais, weight loss, blurry vision, decreased visual acuity, cough, sputum, SOB, hemoptysis, pleuritic pain, palpitaitons, heartburn, abdominal pain, melena, lower extremity edema, claudication, or rash.  All other systems reviewed and negative  General: Affect appropriate Healthy:  appears stated age 88: normal Neck supple with no adenopathy JVP normal no bruits no thyromegaly Lungs clear with no wheezing and good diaphragmatic motion Heart:  S1/S2 no murmur, no rub, gallop or click PMI normal Abdomen: benighn, BS positve, no tenderness, no AAA recent left inguinal hernia repair  no bruit.  No HSM or HJR Distal pulses intact with no bruits No edema Neuro non-focal Skin warm and dry No muscular weakness   Current Outpatient Medications  Medication Sig Dispense Refill  . acetaminophen (TYLENOL) 500 MG tablet Take 1,000 mg by mouth every 6 (six) hours as needed for mild pain or headache.    . clopidogrel (PLAVIX) 75 MG tablet Take 1 tablet (75 mg total) by mouth daily. Please keep upcoming appt with Dr. Johnsie Cancel in December for future refills. Thank you 90  tablet 0  . fluticasone (FLONASE) 50 MCG/ACT nasal spray Place 2 sprays into the nose daily as needed for allergies.    Marland Kitchen glipiZIDE (GLUCOTROL) 5 MG tablet Take 5 mg by mouth 2 (two) times daily.  0  . losartan (COZAAR) 50 MG tablet TAKE 1 TABLET BY MOUTH ONCE DAILY 30 tablet 7  . Melatonin 5 MG TABS Take 5 mg by mouth at bedtime as needed.    . metFORMIN (GLUCOPHAGE) 500 MG tablet Take 500 mg by mouth 2 (two) times daily with a meal.    . nitroGLYCERIN (NITROSTAT) 0.4 MG SL tablet DISSOLVE ONE TABLET UNDER THE TONGUE EVERY 5 MINUTES AS NEEDED FOR CHEST PAIN.  DO NOT EXCEED A TOTAL OF 3 DOSES IN 15 MINUTES 25 tablet 3  . pantoprazole (PROTONIX) 40 MG tablet Take 40 mg by mouth daily.    . rosuvastatin (CRESTOR) 10 MG tablet Take 1 tablet (10 mg total) by mouth daily. 30 tablet 1   No current facility-administered medications for this visit.     Allergies  Penicillins  Electrocardiogram:  SB rate 51 PVC  11/15  03/13/15  Sr rate 61 LAD ICRBBB 09/17/16 SR RBBB rate 58  Assessment and Plan CAD:  Stent to ramus in 2014 no ischemia on myovue 03/2014 needs to be updated With new symptoms  HTN:  BP readings up will hopefully improve with escalating doses of Entresto If he Can't afford this will increase Cozaar to 100 mg daily  DCM:  Ischemic EF  45-50% needs updated TTE  ordered  Chol:  Needs Crestor holiday for 4 weeks If he has less myalgias would consider this failure Of 2 statins Would start zocor 20 mg and refer to lipid clinic for possible PSK 9  Prostate:  F/u urology consider changing flomax to proscar to avoid headaches  Neuropathy:  From DM affecting feet discuss with primary use of neurontin DM:  macular degeneration   F/U with me next available   [employer]

## 2018-06-09 ENCOUNTER — Ambulatory Visit: Payer: Medicare HMO | Admitting: Cardiovascular Disease

## 2018-06-09 ENCOUNTER — Encounter: Payer: Self-pay | Admitting: Cardiovascular Disease

## 2018-06-09 VITALS — BP 132/70 | HR 61 | Ht 71.0 in | Wt 205.0 lb

## 2018-06-09 DIAGNOSIS — R079 Chest pain, unspecified: Secondary | ICD-10-CM | POA: Diagnosis not present

## 2018-06-09 DIAGNOSIS — I509 Heart failure, unspecified: Secondary | ICD-10-CM | POA: Diagnosis not present

## 2018-06-09 DIAGNOSIS — I420 Dilated cardiomyopathy: Secondary | ICD-10-CM

## 2018-06-09 DIAGNOSIS — I38 Endocarditis, valve unspecified: Secondary | ICD-10-CM

## 2018-06-09 MED ORDER — SACUBITRIL-VALSARTAN 24-26 MG PO TABS: 1.0000 | ORAL_TABLET | Freq: Two times a day (BID) | ORAL | 11 refills | Status: DC

## 2018-06-09 NOTE — Patient Instructions (Signed)
Medication Instructions:  Your physician has recommended you make the following change in your medication:  1-STOP Losartan  2-START Entresto 24/26 mg by mouth twice daily.  If you need a refill on your cardiac medications before your next appointment, please call your pharmacy.   Lab work: Your physician recommends that you return for lab work at the time of your test are scheduled for fasting lipid panel, liver panel, BMET and BNP.   If you have labs (blood work) drawn today and your tests are completely normal, you will receive your results only by: Marland Kitchen MyChart Message (if you have MyChart) OR . A paper copy in the mail If you have any lab test that is abnormal or we need to change your treatment, we will call you to review the results.  Testing/Procedures: Your physician has requested that you have an echocardiogram. Echocardiography is a painless test that uses sound waves to create images of your heart. It provides your doctor with information about the size and shape of your heart and how well your heart's chambers and valves are working. This procedure takes approximately one hour. There are no restrictions for this procedure.  Your physician has requested that you have en exercise stress myoview. For further information please visit [email or web address]. Please follow instruction sheet, as given.  Follow-Up: At [facility], you and your health needs are our priority.  As part of our continuing mission to provide you with exceptional heart care, we have created designated Provider Care Teams.  These Care Teams include your primary Cardiologist (physician) and Advanced Practice Providers (APPs -  Physician Assistants and Nurse Practitioners) who all work together to provide you with the care you need, when you need it. Your physician recommends that you schedule a follow-up appointment next available after test complete with Dr. Johnsie Cancel.

## 2018-06-12 ENCOUNTER — Ambulatory Visit (INDEPENDENT_AMBULATORY_CARE_PROVIDER_SITE_OTHER): Payer: Medicare HMO

## 2018-06-12 DIAGNOSIS — I639 Cerebral infarction, unspecified: Secondary | ICD-10-CM | POA: Diagnosis not present

## 2018-06-12 LAB — CUP PACEART REMOTE DEVICE CHECK
Date Time Interrogation Session: 20191221193821
MDC IDC PG IMPLANT DT: 20180927

## 2018-06-12 NOTE — Progress Notes (Signed)
Carelink Summary Report / Loop Recorder 

## 2018-06-19 ENCOUNTER — Telehealth (HOSPITAL_COMMUNITY): Payer: Self-pay | Admitting: *Deleted

## 2018-06-19 ENCOUNTER — Other Ambulatory Visit: Payer: Medicare HMO

## 2018-06-19 NOTE — Telephone Encounter (Signed)
Left message on voicemail per DPR in reference to upcoming appointment scheduled on 06/23/17 with detailed instructions given per Myocardial Perfusion Study Information Sheet for the test. LM to arrive 15 minutes early, and that it is imperative to arrive on time for appointment to keep from having the test rescheduled. If you need to cancel or reschedule your appointment, please call the office within 24 hours of your appointment. Failure to do so may result in a cancellation of your appointment, and a $50 no show fee. Phone number given for call back for any questions. Kirstie Peri

## 2018-06-23 ENCOUNTER — Ambulatory Visit (HOSPITAL_BASED_OUTPATIENT_CLINIC_OR_DEPARTMENT_OTHER): Payer: Medicare HMO

## 2018-06-23 ENCOUNTER — Ambulatory Visit (HOSPITAL_COMMUNITY): Payer: Medicare HMO | Attending: Cardiology

## 2018-06-23 ENCOUNTER — Other Ambulatory Visit: Payer: Medicare HMO | Admitting: *Deleted

## 2018-06-23 ENCOUNTER — Other Ambulatory Visit: Payer: Self-pay

## 2018-06-23 DIAGNOSIS — I509 Heart failure, unspecified: Secondary | ICD-10-CM

## 2018-06-23 DIAGNOSIS — I420 Dilated cardiomyopathy: Secondary | ICD-10-CM

## 2018-06-23 DIAGNOSIS — I38 Endocarditis, valve unspecified: Secondary | ICD-10-CM

## 2018-06-23 DIAGNOSIS — R079 Chest pain, unspecified: Secondary | ICD-10-CM

## 2018-06-23 DIAGNOSIS — E785 Hyperlipidemia, unspecified: Secondary | ICD-10-CM

## 2018-06-23 LAB — BASIC METABOLIC PANEL
BUN / CREAT RATIO: 21 (ref 10–24)
BUN: 19 mg/dL (ref 8–27)
CO2: 22 mmol/L (ref 20–29)
Calcium: 10 mg/dL (ref 8.6–10.2)
Chloride: 102 mmol/L (ref 96–106)
Creatinine, Ser: 0.89 mg/dL (ref 0.76–1.27)
GFR calc non Af Amer: 83 mL/min/{1.73_m2} (ref >59)
GFR, EST AFRICAN AMERICAN: 96 mL/min/{1.73_m2} (ref >59)
Glucose: 115 mg/dL — ABNORMAL HIGH (ref 65–99)
Potassium: 4.3 mmol/L (ref 3.5–5.2)
SODIUM: 140 mmol/L (ref 134–144)

## 2018-06-23 LAB — LIPID PANEL
Chol/HDL Ratio: 4.8 ratio (ref 0.0–5.0)
Cholesterol, Total: 191 mg/dL (ref 100–199)
HDL: 40 mg/dL (ref >39)
LDL Calculated: 122 mg/dL — ABNORMAL HIGH (ref 0–99)
TRIGLYCERIDES: 143 mg/dL (ref 0–149)
VLDL Cholesterol Cal: 29 mg/dL (ref 5–40)

## 2018-06-23 LAB — MYOCARDIAL PERFUSION IMAGING
CHL CUP RESTING HR STRESS: 55 {beats}/min
LV dias vol: 108 mL (ref 62–150)
LVSYSVOL: 51 mL
Peak HR: 108 {beats}/min
SDS: 4
SRS: 4
SSS: 8
TID: 0.79

## 2018-06-23 LAB — HEPATIC FUNCTION PANEL
ALT: 29 IU/L (ref 0–44)
AST: 23 IU/L (ref 0–40)
Albumin: 4.5 g/dL (ref 3.5–4.8)
Alkaline Phosphatase: 67 IU/L (ref 39–117)
Bilirubin Total: 0.7 mg/dL (ref 0.0–1.2)
Bilirubin, Direct: 0.18 mg/dL (ref 0.00–0.40)
Total Protein: 6.8 g/dL (ref 6.0–8.5)

## 2018-06-23 LAB — PRO B NATRIURETIC PEPTIDE: NT-Pro BNP: 40 pg/mL (ref 0–486)

## 2018-06-23 MED ORDER — TECHNETIUM TC 99M TETROFOSMIN IV KIT
31.4000 | PACK | Freq: Once | INTRAVENOUS | Status: AC | PRN
  Administered 2018-06-23: 31.4 via INTRAVENOUS
  Filled 2018-06-23: qty 32

## 2018-06-23 MED ORDER — TECHNETIUM TC 99M TETROFOSMIN IV KIT
10.3000 | PACK | Freq: Once | INTRAVENOUS | Status: AC | PRN
  Administered 2018-06-23: 10.3 via INTRAVENOUS
  Filled 2018-06-23: qty 11

## 2018-06-23 MED ORDER — REGADENOSON 0.4 MG/5ML IV SOLN
0.4000 mg | Freq: Once | INTRAVENOUS | Status: AC
  Administered 2018-06-23: 0.4 mg via INTRAVENOUS

## 2018-06-25 LAB — CUP PACEART REMOTE DEVICE CHECK
Implantable Pulse Generator Implant Date: 20180927
MDC IDC SESS DTM: 20191118193948

## 2018-06-26 NOTE — Progress Notes (Signed)
Patient ID: DAIVEON MARKMAN, male   DOB: April 20, 1941, 78 y.o.   MRN: [id number]   77 y.o.  self referral. Previously seen by [name]. Did not like [facility] practice. Wanted to change to [place]. On 03/01/13 He was taken acutely to the cath lab which revealed a totally occluded ramus branch. This was stented with a 3.0x16 mm Promus Premier DES stent postdilated 3.2 mm with 100% occlusion reduced to 0% and resumption of TIMI 3 flow.   03/06/14  ETT normal HTN response and PVCls BP meds adjusted 03/24/15 Myovue old scar no ischemia EF 52%  Medical Rx recommended  Echo 03/24/15  EF a bit better than immediate post PCI/Stent EF 45-50%   Had some atypical chest pain and myalgias last visit  Myovue 06/23/18  non ischemic TTE EF improved 50-55% 06/23/18   Can't afford Entresto wants to go back on ARB. Myalgias better off crestor but LDL 122 And does not want to take PSK-9 shots   ROS: Denies fever, malais, weight loss, blurry vision, decreased visual acuity, cough, sputum, SOB, hemoptysis, pleuritic pain, palpitaitons, heartburn, abdominal pain, melena, lower extremity edema, claudication, or rash.  All other systems reviewed and negative  General: Affect appropriate Healthy:  appears stated age 61: normal Neck supple with no adenopathy JVP normal no bruits no thyromegaly Lungs clear with no wheezing and good diaphragmatic motion Heart:  S1/S2 no murmur, no rub, gallop or click PMI normal ILR in pre pectoral [place] tissue  Abdomen: benighn, BS positve, no tenderness, no AAA recent left inguinal hernia repair  no bruit.  No HSM or HJR Distal pulses intact with no bruits No edema Neuro non-focal Skin warm and dry No muscular weakness   Current Outpatient Medications  Medication Sig Dispense Refill  . acetaminophen (TYLENOL) 500 MG tablet Take 1,000 mg by mouth every 6 (six) hours as needed for mild pain or headache.    . clopidogrel (PLAVIX) 75 MG tablet Take 1 tablet (75 mg total) by mouth daily.  Please keep upcoming appt with Dr. Johnsie Cancel in December for future refills. Thank you 90 tablet 0  . fluticasone (FLONASE) 50 MCG/ACT nasal spray Place 2 sprays into the nose daily as needed for allergies.    Marland Kitchen glipiZIDE (GLUCOTROL) 5 MG tablet Take 5 mg by mouth 2 (two) times daily.  0  . Melatonin 5 MG TABS Take 5 mg by mouth at bedtime as needed.    . metFORMIN (GLUCOPHAGE) 500 MG tablet Take 500 mg by mouth 2 (two) times daily with a meal.    . nitroGLYCERIN (NITROSTAT) 0.4 MG SL tablet DISSOLVE ONE TABLET UNDER THE TONGUE EVERY 5 MINUTES AS NEEDED FOR CHEST PAIN.  DO NOT EXCEED A TOTAL OF 3 DOSES IN 15 MINUTES 25 tablet 3  . pantoprazole (PROTONIX) 40 MG tablet Take 40 mg by mouth daily.    . rosuvastatin (CRESTOR) 5 MG tablet Take 1 tablet (5 mg total) by mouth daily. 90 tablet 3  . losartan (COZAAR) 50 MG tablet Take 1 tablet (50 mg total) by mouth 2 (two) times daily. 180 tablet 3   No current facility-administered medications for this visit.     Allergies  Penicillins  Electrocardiogram:  SB rate 51 PVC  11/15  03/13/15  Sr rate 61 LAD ICRBBB 09/17/16 SR RBBB rate 58  Assessment and Plan CAD:  Stent to ramus in 2014 no ischemia on myovue 06/23/18 continue medical Rx HTN:  Start cozaar 50 bid  DCM:  Ischemic EF  Improved on TTE done 06/23/18 50-55% can't afford Entresto using ARB Chol:  Start crestor 5 mg qod and reassess lipids in 3 months  Neuropathy:  From DM affecting feet discuss with primary use of neurontin DM:  macular degeneration Cryptogenic stroke:  ILR followed by Dr Caryl Comes no PAF    F/U with me 6 months    Jenkins Rouge

## 2018-06-27 ENCOUNTER — Encounter: Payer: Self-pay | Admitting: Cardiovascular Disease

## 2018-06-27 ENCOUNTER — Ambulatory Visit: Payer: Medicare HMO | Admitting: Cardiovascular Disease

## 2018-06-27 VITALS — BP 140/80 | HR 60 | Ht 71.0 in | Wt 206.0 lb

## 2018-06-27 DIAGNOSIS — I10 Essential (primary) hypertension: Secondary | ICD-10-CM | POA: Diagnosis not present

## 2018-06-27 DIAGNOSIS — I2510 Atherosclerotic heart disease of native coronary artery without angina pectoris: Secondary | ICD-10-CM | POA: Diagnosis not present

## 2018-06-27 DIAGNOSIS — I2583 Coronary atherosclerosis due to lipid rich plaque: Secondary | ICD-10-CM

## 2018-06-27 DIAGNOSIS — I420 Dilated cardiomyopathy: Secondary | ICD-10-CM | POA: Diagnosis not present

## 2018-06-27 DIAGNOSIS — I639 Cerebral infarction, unspecified: Secondary | ICD-10-CM

## 2018-06-27 DIAGNOSIS — E785 Hyperlipidemia, unspecified: Secondary | ICD-10-CM | POA: Diagnosis not present

## 2018-06-27 DIAGNOSIS — Z9861 Coronary angioplasty status: Secondary | ICD-10-CM

## 2018-06-27 MED ORDER — ROSUVASTATIN CALCIUM 5 MG PO TABS: 5.0000 mg | ORAL_TABLET | Freq: Every day | ORAL | 3 refills | Status: DC

## 2018-06-27 MED ORDER — LOSARTAN POTASSIUM 50 MG PO TABS: 50.0000 mg | ORAL_TABLET | Freq: Two times a day (BID) | ORAL | 3 refills | Status: DC

## 2018-06-27 NOTE — Patient Instructions (Addendum)
Medication Instructions:  Your physician has recommended you make the following change in your medication:  1-TAKE Crestor 5 mg by mouth every other day or every day as tolerated 2-STOP Entresto 3-TAKE Losartan 50 mg by mouth twice daily   If you need a refill on your cardiac medications before your next appointment, please call your pharmacy.   Lab work: Your physician recommends that you return for lab work in: 3 months for fasting lipid and liver panel.  If you have labs (blood work) drawn today and your tests are completely normal, you will receive your results only by: Marland Kitchen MyChart Message (if you have MyChart) OR . A paper copy in the mail If you have any lab test that is abnormal or we need to change your treatment, we will call you to review the results.  Testing/Procedures: None ordered today.  Follow-Up: At [facility], you and your health needs are our priority.  As part of our continuing mission to provide you with exceptional heart care, we have created designated Provider Care Teams.  These Care Teams include your primary Cardiologist (physician) and Advanced Practice Providers (APPs -  Physician Assistants and Nurse Practitioners) who all work together to provide you with the care you need, when you need it. You will need a follow up appointment in 6 months.  Please call our office 2 months in advance to schedule this appointment.  You may see Dr. Johnsie Cancel or one of the following Advanced Practice Providers on your designated Care Team:   Truitt Merle, NP Cecilie Kicks, NP . Kathyrn Drown, NP

## 2018-07-13 ENCOUNTER — Ambulatory Visit (INDEPENDENT_AMBULATORY_CARE_PROVIDER_SITE_OTHER): Payer: Medicare HMO

## 2018-07-13 DIAGNOSIS — I639 Cerebral infarction, unspecified: Secondary | ICD-10-CM | POA: Diagnosis not present

## 2018-07-14 NOTE — Progress Notes (Signed)
Carelink Summary Report / Loop Recorder 

## 2018-07-16 LAB — CUP PACEART REMOTE DEVICE CHECK
Date Time Interrogation Session: 20200123193606
Implantable Pulse Generator Implant Date: 20180927

## 2018-07-28 ENCOUNTER — Telehealth: Payer: Self-pay | Admitting: Cardiology

## 2018-07-28 NOTE — Telephone Encounter (Signed)
Patient called and stated that he is no longer monitoring his loop recorder w/ his home monitor.

## 2018-09-05 ENCOUNTER — Other Ambulatory Visit: Payer: Self-pay | Admitting: Cardiovascular Disease

## 2018-09-26 ENCOUNTER — Other Ambulatory Visit: Payer: Medicare HMO

## 2018-10-03 ENCOUNTER — Other Ambulatory Visit: Payer: Self-pay

## 2018-10-03 ENCOUNTER — Encounter: Payer: Self-pay | Admitting: Adult Health

## 2018-10-03 ENCOUNTER — Ambulatory Visit (INDEPENDENT_AMBULATORY_CARE_PROVIDER_SITE_OTHER): Payer: Medicare HMO | Admitting: Adult Health

## 2018-10-03 DIAGNOSIS — G4733 Obstructive sleep apnea (adult) (pediatric): Secondary | ICD-10-CM

## 2018-10-03 DIAGNOSIS — Z9989 Dependence on other enabling machines and devices: Secondary | ICD-10-CM | POA: Diagnosis not present

## 2018-10-03 NOTE — Progress Notes (Addendum)
[facility] [address] [place]. [place] [address] [phone]     Virtual Visit via Telephone Note  I connected with Jose Duarte on 10/03/18 at 10:15 AM EDT by telephone located remotely within my own home and verified that I am speaking with the correct person using two identifiers who reports being located within his own home.    I discussed the limitations, risks, security and privacy concerns of performing an evaluation and management service by telephone and the availability of in person appointments. I also discussed with the patient that there may be a patient responsible charge related to this service. The patient expressed understanding and agreed to proceed.   History of Present Illness:  Jose Duarte is a 78 y.o. male who has been followed in this office for diagnosis of OSA with initiation of CPAP after sleep study 12/20/2017. He was initially scheduled for face-to-face office visit today at this time for CPAP compliance visit but due to [place], face-to-face office visit rescheduled for non-face-to-face telephone visit.  CPAP download from 09/03/2018 -10/02/2018 shows 30 out of 30 usage days with 21 days greater than 4 hours for 70% overall compliance.  Average usage 5 hours and 7 minutes with residual AHI 1.1.  Leaks in the 95th percentile 15.7 L/min on a set pressure of 13 cm H2O and EPR level 3. He states he will place mask on nightly but at times can become restless with tossing and turning and his mask will become loose causing a leak and will remove at that time.  He does feel as though his overall fatigue has greatly improved with use of his CPAP and can go all day without feeling fatigued.  He typically becomes fatigued around 9 PM each night and at that time he will go to bed.  He continues to receive new supplies through [employer] and is not in need of any supplies at this time.  No concerns at this time.     Observations/Objective:   General: Pleasant elderly Caucasian male answering and asking questions appropriately throughout conversation  Epworth Sleepiness Scale: 9      Assessment and Plan:  Jose Duarte is a 78 y.o. Caucasian male with underlying medical history of arthritis, CAD status post percutaneous coronary angioplasty, history of stroke, HTN, HLD, DM with neuropathy, ischemic cardiomyopathy, and OSA on CPAP.  He has been doing well on CPAP for OSA management with optimal residual AHI 1.1.  Discussion regarding importance of increasing days where he uses mask for greater than 4 hours.  Advised him that if he experiences leaking from tossing and turning to attempt to tighten straps instead of removing mask to improve his greater than 4-hour compliance rate.  Also advised him to reach out to his DME company if he continues to experience difficulty.  No indication for CPAP changes at this time.  He is aware to reach out to DME company [employer] with any needed supplies for CPAP concerns.    Follow Up Instructions:   Follow-up in 1 year with Jinny Blossom, NP or call earlier if needed    I discussed the assessment and treatment plan with the patient along with a review of his CPAP download.  The patient was provided an opportunity to ask questions and all were answered to their satisfaction. The patient agreed with the plan and verbalized an understanding of the instructions.   I provided 26 minutes of non-face-to-face time during this encounter.    Venancio Poisson,  AGNP-BC  [facility] [address] [place], [place] [address]  Phone [phone] Fax [phone] Note: This document was prepared with digital dictation and possible smart phrase technology. Any transcriptional errors that result from this process are unintentional.  I reviewed the above note and documentation by the Nurse Practitioner and agree with the history, assessment and plan as outlined above. I  was immediately available for phone consultation. Star Age, MD, PhD [facility])

## 2018-11-20 ENCOUNTER — Telehealth: Payer: Self-pay | Admitting: *Deleted

## 2018-11-20 NOTE — Telephone Encounter (Signed)
    COVID-19 Pre-Screening Questions:  . In the past 7 to 10 days have you had a cough,  shortness of breath, headache, congestion, fever (100 or greater) body aches, chills, sore throat, or sudden loss of taste or sense of smell? . Have you been around anyone with known Covid 19. . Have you been around anyone who is awaiting Covid 19 test results in the past 7 to 10 days? . Have you been around anyone who has been exposed to Covid 19, or has mentioned symptoms of Covid 19 within the past 7 to 10 days?  If you have any concerns/questions about symptoms patients report during screening (either on the phone or at threshold). Contact the provider seeing the patient or DOD for further guidance.  If neither are available contact a member of the leadership team.        Contacted via telephone call. All no's to Covid 19 questionaire. Has own mask.  KB

## 2018-11-21 ENCOUNTER — Other Ambulatory Visit: Payer: Medicare HMO

## 2018-11-21 ENCOUNTER — Other Ambulatory Visit: Payer: Self-pay

## 2018-11-21 DIAGNOSIS — E785 Hyperlipidemia, unspecified: Secondary | ICD-10-CM

## 2018-11-21 DIAGNOSIS — I2583 Coronary atherosclerosis due to lipid rich plaque: Secondary | ICD-10-CM

## 2018-11-21 DIAGNOSIS — I10 Essential (primary) hypertension: Secondary | ICD-10-CM

## 2018-11-21 DIAGNOSIS — I2510 Atherosclerotic heart disease of native coronary artery without angina pectoris: Secondary | ICD-10-CM

## 2018-11-21 DIAGNOSIS — I639 Cerebral infarction, unspecified: Secondary | ICD-10-CM

## 2018-11-21 DIAGNOSIS — Z9861 Coronary angioplasty status: Secondary | ICD-10-CM

## 2018-11-21 DIAGNOSIS — I420 Dilated cardiomyopathy: Secondary | ICD-10-CM

## 2018-11-21 LAB — LIPID PANEL
Chol/HDL Ratio: 2.9 ratio (ref 0.0–5.0)
Cholesterol, Total: 120 mg/dL (ref 100–199)
HDL: 41 mg/dL (ref >39)
LDL Calculated: 60 mg/dL (ref 0–99)
Triglycerides: 93 mg/dL (ref 0–149)
VLDL Cholesterol Cal: 19 mg/dL (ref 5–40)

## 2018-11-21 LAB — HEPATIC FUNCTION PANEL
ALT: 30 IU/L (ref 0–44)
AST: 32 IU/L (ref 0–40)
Albumin: 4.4 g/dL (ref 3.7–4.7)
Alkaline Phosphatase: 71 IU/L (ref 39–117)
Bilirubin Total: 0.8 mg/dL (ref 0.0–1.2)
Bilirubin, Direct: 0.21 mg/dL (ref 0.00–0.40)
Total Protein: 6.7 g/dL (ref 6.0–8.5)

## 2019-02-08 NOTE — Progress Notes (Signed)
Patient ID: Jose Duarte, male   DOB: 1940/08/22, 78 y.o.   MRN: [id number]   78 y.o.  self referral. Previously seen by [name]. Did not like [facility] practice. Wanted to change to [place]. On 03/01/13 He was taken acutely to the cath lab which revealed a totally occluded ramus branch. This was stented with a 3.0x16 mm Promus Premier DES stent postdilated 3.2 mm with 100% occlusion reduced to 0% and resumption of TIMI 3 flow. EF has been 45-50% by echo Last myovue January 2020 with old  Scar no ischemia EF 53%.    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  All other systems reviewed and negative  General: Affect appropriate Healthy:  appears stated age 22: normal Neck supple with no adenopathy JVP normal no bruits no thyromegaly Lungs clear with no wheezing and good diaphragmatic motion Heart:  S1/S2 no murmur, no rub, gallop or click PMI normal Abdomen: benighn, BS positve, no tenderness, no AAA recent left inguinal hernia repair  no bruit.  No HSM or HJR Distal pulses intact with no bruits No edema Neuro non-focal Skin warm and dry No muscular weakness   Current Outpatient Medications  Medication Sig Dispense Refill  . acetaminophen (TYLENOL) 500 MG tablet Take 1,000 mg by mouth every 6 (six) hours as needed for mild pain or headache.    . clopidogrel (PLAVIX) 75 MG tablet TAKE 1 TABLET BY MOUTH ONCE DAILY 90 tablet 3  . fluticasone (FLONASE) 50 MCG/ACT nasal spray Place 2 sprays into the nose daily as needed for allergies.    Marland Kitchen glipiZIDE (GLUCOTROL) 5 MG tablet Take 5 mg  by mouth 2 (two) times daily.  0  . losartan (COZAAR) 50 MG tablet Take 1 tablet (50 mg total) by mouth 2 (two) times daily. 180 tablet 3  . nitroGLYCERIN (NITROSTAT) 0.4 MG SL tablet DISSOLVE ONE TABLET UNDER THE TONGUE EVERY 5 MINUTES AS NEEDED FOR CHEST PAIN.  DO NOT EXCEED A TOTAL OF 3 DOSES IN 15 MINUTES 25 tablet 3  . pantoprazole (PROTONIX) 40 MG tablet Take 40 mg by mouth daily.    . rosuvastatin (CRESTOR) 5 MG tablet Take 1 tablet (5 mg total) by mouth daily. 90 tablet 3   No current facility-administered medications for this visit.     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  Continue current medications and low sodium Dash type diet.   DCM:  Ischemic EF  45-50% needs updated TTE ordered  Chol:  On crestor labs with primary  Prostate:  F/u urology consider changing flomax to proscar to avoid headaches  Neuropathy:  From DM affecting feet discuss with primary use of neurontin DM:  Discussed low carb diet.  Target hemoglobin A1c is 6.5 or less.  Continue current medications.    F/U with me in 6 months echo for ischemic DCM  Jenkins Rouge

## 2019-02-21 ENCOUNTER — Encounter: Payer: Self-pay | Admitting: Cardiovascular Disease

## 2019-02-21 ENCOUNTER — Ambulatory Visit (INDEPENDENT_AMBULATORY_CARE_PROVIDER_SITE_OTHER): Payer: Medicare HMO | Admitting: Cardiovascular Disease

## 2019-02-21 ENCOUNTER — Other Ambulatory Visit: Payer: Self-pay

## 2019-02-21 VITALS — BP 126/70 | HR 53 | Ht 71.0 in | Wt 203.4 lb

## 2019-02-21 DIAGNOSIS — I10 Essential (primary) hypertension: Secondary | ICD-10-CM | POA: Diagnosis not present

## 2019-02-21 DIAGNOSIS — I255 Ischemic cardiomyopathy: Secondary | ICD-10-CM | POA: Diagnosis not present

## 2019-02-21 DIAGNOSIS — I2119 ST elevation (STEMI) myocardial infarction involving other coronary artery of inferior wall: Secondary | ICD-10-CM

## 2019-02-21 NOTE — Patient Instructions (Signed)
Your physician recommends that you continue on your current medications as directed. Please refer to the Current Medication list given to you today.   Your physician has requested that you have an echocardiogram. Echocardiography is a painless test that uses sound waves to create images of your heart. It provides your doctor with information about the size and shape of your heart and how well your heart's chambers and valves are working. This procedure takes approximately one hour. There are no restrictions for this procedure.   Your physician wants you to follow-up in: 6 MONTHS WITH DR NISHAN   You will receive a reminder letter in the mail two months in advance. If you don't receive a letter, please call our office to schedule the follow-up appointment.  

## 2019-02-27 ENCOUNTER — Other Ambulatory Visit: Payer: Self-pay

## 2019-02-27 ENCOUNTER — Ambulatory Visit (HOSPITAL_COMMUNITY): Payer: Medicare HMO | Attending: Cardiovascular Disease

## 2019-02-27 DIAGNOSIS — I255 Ischemic cardiomyopathy: Secondary | ICD-10-CM | POA: Diagnosis not present

## 2019-04-05 ENCOUNTER — Other Ambulatory Visit: Payer: Self-pay | Admitting: Family Medicine

## 2019-04-05 ENCOUNTER — Ambulatory Visit
Admission: RE | Admit: 2019-04-05 | Discharge: 2019-04-05 | Disposition: A | Discharge status: Home / Self Care | Payer: Medicare HMO | Source: Ambulatory Visit | Attending: Family Medicine | Admitting: Family Medicine

## 2019-04-05 DIAGNOSIS — R05 Cough: Secondary | ICD-10-CM

## 2019-04-05 DIAGNOSIS — R059 Cough, unspecified: Secondary | ICD-10-CM

## 2019-07-05 ENCOUNTER — Ambulatory Visit: Payer: Medicare Other | Attending: Internal Medicine

## 2019-07-05 DIAGNOSIS — Z23 Encounter for immunization: Secondary | ICD-10-CM | POA: Insufficient documentation

## 2019-07-05 NOTE — Progress Notes (Signed)
   Covid-19 Vaccination Clinic  Name:  SHEPHEN LASHLEE    MRN: [id number] DOB: [birth year]  07/05/2019  Mr. Landry was observed post Covid-19 immunization for 30 minutes based on pre-vaccination screening without incidence. He was provided with Vaccine Information Sheet and instruction to access the V-Safe system.   Mr. Hochstein was instructed to call 911 with any severe reactions post vaccine: Marland Kitchen Difficulty breathing  . Swelling of your face and throat  . A fast heartbeat  . A bad rash all over your body  . Dizziness and weakness    Immunizations Administered    Name Date Dose VIS Date Route   Pfizer COVID-19 Vaccine 07/05/2019  9:03 AM 0.3 mL 06/01/2019 Intramuscular   Manufacturer: [place]   Lot: [id number]   [place]: [id number]

## 2019-07-23 ENCOUNTER — Ambulatory Visit: Payer: Medicare HMO | Attending: Internal Medicine

## 2019-07-23 ENCOUNTER — Ambulatory Visit: Payer: Medicare HMO

## 2019-07-23 DIAGNOSIS — Z23 Encounter for immunization: Secondary | ICD-10-CM

## 2019-07-23 NOTE — Progress Notes (Signed)
   Covid-19 Vaccination Clinic  Name:  [name]    MRN: [id number] DOB: [birth year]  07/23/2019  Jose Duarte was observed post Covid-19 immunization for 30 minutes based on pre-vaccination screening without incidence. He was provided with Vaccine Information Sheet and instruction to access the V-Safe system.   Jose Duarte was instructed to call 911 with any severe reactions post vaccine: Marland Kitchen Difficulty breathing  . Swelling of your face and throat  . A fast heartbeat  . A bad rash all over your body  . Dizziness and weakness    Immunizations Administered    Name Date Dose VIS Date Route   Pfizer COVID-19 Vaccine 07/23/2019  8:56 AM 0.3 mL 06/01/2019 Intramuscular   Manufacturer: [place]   Lot: [id number]   [place]: [id number]

## 2019-09-04 ENCOUNTER — Other Ambulatory Visit: Payer: Self-pay | Admitting: Cardiovascular Disease

## 2019-11-07 ENCOUNTER — Other Ambulatory Visit: Payer: Self-pay | Admitting: Cardiovascular Disease

## 2019-12-10 NOTE — Progress Notes (Signed)
Patient ID: Jose Duarte, male   DOB: 10/24/1940, 79 y.o.   MRN: [id number]   79 y.o.  self referral. Previously seen by [name]. Did not like [facility] practice. Wanted to change to [place]. On 03/01/13 He was taken acutely to the cath lab which revealed a totally occluded ramus branch. This was stented with a 3.0x16 mm Promus Premier DES stent postdilated 3.2 mm with 100% occlusion reduced to 0% and resumption of TIMI 3 flow. EF has been 45-50% by echo Last myovue January 2020 with old  Scar no ischemia EF 53%.    Some myalgias with lipitor less with crestor Crestor dose increased to 10 mg with LDL now in 60 level   Some peripheral neuropathy affects walking   Echo reviewed 02/27/19 EF 60-65% mild MR  Had COVID vaccine February 2021   Still seeing Dr Arelia Sneddon Inquired about Viagra but told him with CAD would not advice He has had some back issues and received cortisone shots / PT Still bothersome   ROS: Denies fever, malais, weight loss, blurry vision, decreased visual acuity, cough, sputum, SOB, hemoptysis, pleuritic pain, palpitaitons, heartburn, abdominal pain, melena, lower extremity edema, claudication, or rash.  All other systems reviewed and negative  General: Affect appropriate Healthy:  appears stated age 79: normal Neck supple with no adenopathy JVP normal no bruits no thyromegaly Lungs clear with no wheezing and good diaphragmatic motion Heart:  S1/S2 no murmur, no rub, gallop or click PMI normal Abdomen: benighn, BS positve, no tenderness, no AAA recent left inguinal hernia repair  no bruit.  No HSM or HJR Distal pulses intact with no bruits No edema Neuro non-focal Skin warm and dry No muscular weakness   Current Outpatient Medications  Medication Sig Dispense Refill  . acetaminophen (TYLENOL) 500 MG tablet Take 1,000 mg by mouth every 6 (six) hours as needed for mild pain or headache.    . clopidogrel (PLAVIX) 75 MG tablet Take 1 tablet by mouth once daily 90  tablet 1  . glipiZIDE (GLUCOTROL) 5 MG tablet Take 10 mg by mouth daily before breakfast.   0  . losartan (COZAAR) 50 MG tablet Take 1 tablet by mouth daily.    . nitroGLYCERIN (NITROSTAT) 0.4 MG SL tablet DISSOLVE ONE TABLET UNDER THE TONGUE EVERY 5 MINUTES AS NEEDED FOR CHEST PAIN.  DO NOT EXCEED A TOTAL OF 3 DOSES IN 15 MINUTES 25 tablet 3  . pantoprazole (PROTONIX) 40 MG tablet Take 40 mg by mouth daily.     No current facility-administered medications for this visit.    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  Continue current medications and low sodium Dash type diet.   DCM:  Ischemic TTE 02/27/19 EF 60-65% mild MR improved  Chol:  On crestor labs with primary LDL 60 11/21/18  Prostate:  F/u urology consider changing flomax to proscar to avoid headaches  Neuropathy:  From DM affecting feet discuss with primary use of neurontin DM:  Discussed low carb diet.  Target hemoglobin A1c is 6.5 or less.  Continue current medications.    F/U with me in  A year with echo for DCM/MR  Jenkins Rouge

## 2019-12-12 ENCOUNTER — Ambulatory Visit: Payer: Medicare HMO | Admitting: Cardiovascular Disease

## 2019-12-12 ENCOUNTER — Other Ambulatory Visit: Payer: Self-pay

## 2019-12-12 ENCOUNTER — Encounter: Payer: Self-pay | Admitting: Cardiovascular Disease

## 2019-12-12 VITALS — BP 142/80 | HR 59 | Ht 71.0 in | Wt 198.0 lb

## 2019-12-12 DIAGNOSIS — I340 Nonrheumatic mitral (valve) insufficiency: Secondary | ICD-10-CM

## 2019-12-12 DIAGNOSIS — I2510 Atherosclerotic heart disease of native coronary artery without angina pectoris: Secondary | ICD-10-CM | POA: Diagnosis not present

## 2019-12-12 NOTE — Patient Instructions (Addendum)
Medication Instructions:  *If you need a refill on your cardiac medications before your next appointment, please call your pharmacy*  Lab Work: If you have labs (blood work) drawn today and your tests are completely normal, you will receive your results only by: . MyChart Message (if you have MyChart) OR . A paper copy in the mail If you have any lab test that is abnormal or we need to change your treatment, we will call you to review the results.  Testing/Procedures: Your physician has requested that you have an echocardiogram in one year. Echocardiography is a painless test that uses sound waves to create images of your heart. It provides your doctor with information about the size and shape of your heart and how well your heart's chambers and valves are working. This procedure takes approximately one hour. There are no restrictions for this procedure.  Follow-Up: At [facility], you and your health needs are our priority.  As part of our continuing mission to provide you with exceptional heart care, we have created designated Provider Care Teams.  These Care Teams include your primary Cardiologist (physician) and Advanced Practice Providers (APPs -  Physician Assistants and Nurse Practitioners) who all work together to provide you with the care you need, when you need it.  We recommend signing up for the patient portal called "MyChart".  Sign up information is provided on this After Visit Summary.  MyChart is used to connect with patients for Virtual Visits (Telemedicine).  Patients are able to view lab/test results, encounter notes, upcoming appointments, etc.  Non-urgent messages can be sent to your provider as well.   To learn more about what you can do with MyChart, go to [email or web address].    Your next appointment:   12 month(s)  The format for your next appointment:   In Person  Provider:   You may see Dr. Nishan or one of the following Advanced Practice Providers on your  designated Care Team:    Lori Gerhardt, NP  Laura Ingold, NP  Jill McDaniel, NP     

## 2020-02-13 ENCOUNTER — Other Ambulatory Visit: Payer: Self-pay

## 2020-02-13 MED ORDER — LOSARTAN POTASSIUM 50 MG PO TABS: 50.0000 mg | ORAL_TABLET | Freq: Every day | ORAL | 2 refills | Status: DC

## 2020-03-20 ENCOUNTER — Other Ambulatory Visit: Payer: Self-pay | Admitting: Cardiovascular Disease

## 2020-07-17 DIAGNOSIS — Z0000 Encounter for general adult medical examination without abnormal findings: Secondary | ICD-10-CM | POA: Diagnosis not present

## 2020-07-17 DIAGNOSIS — E669 Obesity, unspecified: Secondary | ICD-10-CM | POA: Diagnosis not present

## 2020-07-17 DIAGNOSIS — N189 Chronic kidney disease, unspecified: Secondary | ICD-10-CM | POA: Diagnosis not present

## 2020-07-17 DIAGNOSIS — Z049 Encounter for examination and observation for unspecified reason: Secondary | ICD-10-CM | POA: Diagnosis not present

## 2020-07-17 DIAGNOSIS — E119 Type 2 diabetes mellitus without complications: Secondary | ICD-10-CM | POA: Diagnosis not present

## 2020-07-17 DIAGNOSIS — E039 Hypothyroidism, unspecified: Secondary | ICD-10-CM | POA: Diagnosis not present

## 2020-07-17 DIAGNOSIS — I10 Essential (primary) hypertension: Secondary | ICD-10-CM | POA: Diagnosis not present

## 2020-07-17 DIAGNOSIS — Z125 Encounter for screening for malignant neoplasm of prostate: Secondary | ICD-10-CM | POA: Diagnosis not present

## 2020-07-17 DIAGNOSIS — I2510 Atherosclerotic heart disease of native coronary artery without angina pectoris: Secondary | ICD-10-CM | POA: Diagnosis not present

## 2020-07-21 DIAGNOSIS — R1084 Generalized abdominal pain: Secondary | ICD-10-CM | POA: Diagnosis not present

## 2020-07-21 DIAGNOSIS — Z1289 Encounter for screening for malignant neoplasm of other sites: Secondary | ICD-10-CM | POA: Diagnosis not present

## 2020-07-22 ENCOUNTER — Other Ambulatory Visit: Payer: Self-pay | Admitting: Family Medicine

## 2020-07-22 ENCOUNTER — Ambulatory Visit
Admission: RE | Admit: 2020-07-22 | Discharge: 2020-07-22 | Disposition: A | Discharge status: Home / Self Care | Payer: Medicare HMO | Source: Ambulatory Visit | Attending: Family Medicine | Admitting: Family Medicine

## 2020-07-22 DIAGNOSIS — L299 Pruritus, unspecified: Secondary | ICD-10-CM

## 2020-07-22 DIAGNOSIS — J9811 Atelectasis: Secondary | ICD-10-CM | POA: Diagnosis not present

## 2020-07-22 DIAGNOSIS — J929 Pleural plaque without asbestos: Secondary | ICD-10-CM | POA: Diagnosis not present

## 2020-07-22 DIAGNOSIS — M419 Scoliosis, unspecified: Secondary | ICD-10-CM | POA: Diagnosis not present

## 2020-07-22 DIAGNOSIS — Z872 Personal history of diseases of the skin and subcutaneous tissue: Secondary | ICD-10-CM | POA: Diagnosis not present

## 2020-07-22 DIAGNOSIS — R091 Pleurisy: Secondary | ICD-10-CM

## 2020-08-20 DIAGNOSIS — E119 Type 2 diabetes mellitus without complications: Secondary | ICD-10-CM | POA: Diagnosis not present

## 2020-09-16 DIAGNOSIS — H524 Presbyopia: Secondary | ICD-10-CM | POA: Diagnosis not present

## 2020-09-16 DIAGNOSIS — Z961 Presence of intraocular lens: Secondary | ICD-10-CM | POA: Diagnosis not present

## 2020-09-16 DIAGNOSIS — H31112 Age-related choroidal atrophy, left eye: Secondary | ICD-10-CM | POA: Diagnosis not present

## 2020-09-16 DIAGNOSIS — Z7984 Long term (current) use of oral hypoglycemic drugs: Secondary | ICD-10-CM | POA: Diagnosis not present

## 2020-09-16 DIAGNOSIS — H353110 Nonexudative age-related macular degeneration, right eye, stage unspecified: Secondary | ICD-10-CM | POA: Diagnosis not present

## 2020-09-16 DIAGNOSIS — E119 Type 2 diabetes mellitus without complications: Secondary | ICD-10-CM | POA: Diagnosis not present

## 2020-09-16 DIAGNOSIS — H26491 Other secondary cataract, right eye: Secondary | ICD-10-CM | POA: Diagnosis not present

## 2020-09-19 DIAGNOSIS — Z20828 Contact with and (suspected) exposure to other viral communicable diseases: Secondary | ICD-10-CM | POA: Diagnosis not present

## 2020-09-19 DIAGNOSIS — K219 Gastro-esophageal reflux disease without esophagitis: Secondary | ICD-10-CM | POA: Diagnosis not present

## 2020-10-13 DIAGNOSIS — M545 Low back pain, unspecified: Secondary | ICD-10-CM | POA: Diagnosis not present

## 2020-10-13 DIAGNOSIS — M4696 Unspecified inflammatory spondylopathy, lumbar region: Secondary | ICD-10-CM | POA: Diagnosis not present

## 2020-10-16 DIAGNOSIS — E119 Type 2 diabetes mellitus without complications: Secondary | ICD-10-CM | POA: Diagnosis not present

## 2020-10-16 DIAGNOSIS — M109 Gout, unspecified: Secondary | ICD-10-CM | POA: Diagnosis not present

## 2020-10-16 DIAGNOSIS — E785 Hyperlipidemia, unspecified: Secondary | ICD-10-CM | POA: Diagnosis not present

## 2020-11-03 DIAGNOSIS — M47816 Spondylosis without myelopathy or radiculopathy, lumbar region: Secondary | ICD-10-CM | POA: Diagnosis not present

## 2020-11-25 DIAGNOSIS — M47816 Spondylosis without myelopathy or radiculopathy, lumbar region: Secondary | ICD-10-CM | POA: Diagnosis not present

## 2020-12-02 DIAGNOSIS — D229 Melanocytic nevi, unspecified: Secondary | ICD-10-CM | POA: Diagnosis not present

## 2020-12-02 DIAGNOSIS — L821 Other seborrheic keratosis: Secondary | ICD-10-CM | POA: Diagnosis not present

## 2020-12-02 DIAGNOSIS — L570 Actinic keratosis: Secondary | ICD-10-CM | POA: Diagnosis not present

## 2020-12-02 DIAGNOSIS — L814 Other melanin hyperpigmentation: Secondary | ICD-10-CM | POA: Diagnosis not present

## 2020-12-02 DIAGNOSIS — L718 Other rosacea: Secondary | ICD-10-CM | POA: Diagnosis not present

## 2020-12-02 DIAGNOSIS — C44519 Basal cell carcinoma of skin of other part of trunk: Secondary | ICD-10-CM | POA: Diagnosis not present

## 2020-12-03 ENCOUNTER — Telehealth: Payer: Self-pay | Admitting: Cardiovascular Disease

## 2020-12-03 MED ORDER — LOSARTAN POTASSIUM 50 MG PO TABS: 50.0000 mg | ORAL_TABLET | Freq: Every day | ORAL | 0 refills | Status: DC

## 2020-12-03 NOTE — Telephone Encounter (Signed)
Pt's medication was sent to pt's pharmacy as requested. Confirmation received.  °

## 2020-12-03 NOTE — Telephone Encounter (Signed)
*  STAT* If patient is at the pharmacy, call can be transferred to refill team.   1. Which medications need to be refilled? (please list name of each medication and dose if known) losartan (COZAAR) 50 MG tablet  2. Which pharmacy/location (including street and city if local pharmacy) is medication to be sent to? [place] [address]  3. Do they need a 30 day or 90 day supply? [place]

## 2020-12-03 NOTE — Addendum Note (Signed)
Addended by: Carter Kitten D on: 12/03/2020 04:35 PM   Modules accepted: Orders

## 2020-12-09 ENCOUNTER — Other Ambulatory Visit: Payer: Self-pay

## 2020-12-09 ENCOUNTER — Ambulatory Visit (HOSPITAL_COMMUNITY): Payer: Medicare HMO | Attending: Cardiovascular Disease

## 2020-12-09 DIAGNOSIS — I2510 Atherosclerotic heart disease of native coronary artery without angina pectoris: Secondary | ICD-10-CM | POA: Diagnosis not present

## 2020-12-09 DIAGNOSIS — I340 Nonrheumatic mitral (valve) insufficiency: Secondary | ICD-10-CM | POA: Diagnosis not present

## 2020-12-09 LAB — ECHOCARDIOGRAM COMPLETE
Area-P 1/2: 2.04 cm2
S' Lateral: 3.2 cm

## 2020-12-11 ENCOUNTER — Telehealth: Payer: Self-pay | Admitting: Cardiovascular Disease

## 2020-12-11 NOTE — Telephone Encounter (Signed)
Called patient with echo results as written below. Patient verbalized understanding.    Josue Hector, MD  Michaelyn Barter, RN EF normal just mild MR overall good

## 2020-12-11 NOTE — Telephone Encounter (Signed)
Patient was returning call for results. Please advise °

## 2020-12-15 ENCOUNTER — Other Ambulatory Visit: Payer: Self-pay | Admitting: Cardiovascular Disease

## 2020-12-23 DIAGNOSIS — M7062 Trochanteric bursitis, left hip: Secondary | ICD-10-CM | POA: Diagnosis not present

## 2020-12-23 DIAGNOSIS — M47816 Spondylosis without myelopathy or radiculopathy, lumbar region: Secondary | ICD-10-CM | POA: Diagnosis not present

## 2020-12-28 NOTE — Progress Notes (Signed)
Patient ID: Jose Duarte, male   DOB: 01/16/1941, 80 y.o.   MRN: [id number]   79 y.o.  f/u.  Previously seen by [name]. Did not like [facility] practice. Wanted to change to [place]. On 03/01/13 He was taken acutely to the cath lab which revealed a totally occluded ramus branch. This was stented with a 3.0x16 mm Promus Premier DES stent postdilated 3.2 mm with 100% occlusion reduced to 0% and resumption of TIMI 3 flow. EF has been 45-50% by echo Last myovue January 2020 with old  Scar no ischemia EF 53%.    Some myalgias with lipitor less with crestor Crestor dose increased to 10 mg with LDL now in 60 level   Some peripheral neuropathy affects walking   Echo reviewed 12/09/20 EF 68% mild MR improved   Now off all cholesterol meds Both Crestor and lipitor caused severe myalgias  Taking two oral hypoglycemics now with A1c 7.2   ROS: Denies fever, malais, weight loss, blurry vision, decreased visual acuity, cough, sputum, SOB, hemoptysis, pleuritic pain, palpitaitons, heartburn, abdominal pain, melena, lower extremity edema, claudication, or rash.  All other systems reviewed and negative  General: Affect appropriate Healthy:  appears stated age 89: normal Neck supple with no adenopathy JVP normal no bruits no thyromegaly Lungs clear with no wheezing and good diaphragmatic motion Heart:  S1/S2 no murmur, no rub, gallop or click PMI normal Abdomen: benighn, BS positve, no tenderness, no AAA recent left inguinal hernia repair  no bruit.  No HSM or HJR Distal pulses intact with no bruits No edema Neuro non-focal Skin warm and dry No muscular weakness   Current Outpatient Medications  Medication Sig Dispense Refill   acetaminophen (TYLENOL) 500 MG tablet Take 1,000 mg by mouth every 6 (six) hours as needed for mild pain or headache.     clopidogrel (PLAVIX) 75 MG tablet Take 1 tablet (75 mg total) by mouth daily. Please keep upcoming appt in July 2022 with Dr. Johnsie Cancel before anymore  refills. Thank you 90 tablet 0   glipiZIDE (GLUCOTROL) 5 MG tablet Take 10 mg by mouth daily before breakfast.   0   losartan (COZAAR) 50 MG tablet Take 1 tablet (50 mg total) by mouth daily. 90 tablet 0   nitroGLYCERIN (NITROSTAT) 0.4 MG SL tablet DISSOLVE ONE TABLET UNDER THE TONGUE EVERY 5 MINUTES AS NEEDED FOR CHEST PAIN.  DO NOT EXCEED A TOTAL OF 3 DOSES IN 15 MINUTES 25 tablet 3   pantoprazole (PROTONIX) 40 MG tablet Take 40 mg by mouth daily.     No current facility-administered medications for this visit.    Allergies  Penicillins  Electrocardiogram:  01/06/2021 rate 55 RBBB LAD no acute changes   Assessment and Plan CAD:  Stent to ramus in 2014 no ischemia on myovue 06/23/18 continue medical RX HTN:  Well controlled.  Continue current medications and low sodium Dash type diet.   DCM:  Ischemic EF normalized by TTE 12/09/20 68% continue medical Rx  Chol:  will get labs from Dr Arelia Sneddon and refer to lipid clinic for PSK 9 or nexlitol  Prostate:  F/u urology flomax d/c ? 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  Target hemoglobin A1c is 6.5 or less.  Continue current medications.    F/U with me in  A year    Jenkins Rouge

## 2021-01-06 ENCOUNTER — Encounter: Payer: Self-pay | Admitting: Cardiovascular Disease

## 2021-01-06 ENCOUNTER — Ambulatory Visit: Payer: Medicare HMO | Admitting: Cardiovascular Disease

## 2021-01-06 ENCOUNTER — Other Ambulatory Visit: Payer: Self-pay

## 2021-01-06 VITALS — BP 136/92 | HR 55 | Ht 71.0 in | Wt 194.0 lb

## 2021-01-06 DIAGNOSIS — I2510 Atherosclerotic heart disease of native coronary artery without angina pectoris: Secondary | ICD-10-CM | POA: Diagnosis not present

## 2021-01-06 DIAGNOSIS — I255 Ischemic cardiomyopathy: Secondary | ICD-10-CM

## 2021-01-06 DIAGNOSIS — I10 Essential (primary) hypertension: Secondary | ICD-10-CM

## 2021-01-06 DIAGNOSIS — E118 Type 2 diabetes mellitus with unspecified complications: Secondary | ICD-10-CM | POA: Diagnosis not present

## 2021-01-06 DIAGNOSIS — E782 Mixed hyperlipidemia: Secondary | ICD-10-CM | POA: Diagnosis not present

## 2021-01-06 NOTE — Patient Instructions (Addendum)
Medication Instructions:  *If you need a refill on your cardiac medications before your next appointment, please call your pharmacy*   Lab Work: If you have labs (blood work) drawn today and your tests are completely normal, you will receive your results only by: [place] (if you have MyChart) OR A paper copy in the mail If you have any lab test that is abnormal or we need to change your treatment, we will call you to review the results.   Testing/Procedures: None ordered today.  Follow-Up: At [facility], you and your health needs are our priority.  As part of our continuing mission to provide you with exceptional heart care, we have created designated Provider Care Teams.  These Care Teams include your primary Cardiologist (physician) and Advanced Practice Providers (APPs -  Physician Assistants and Nurse Practitioners) who all work together to provide you with the care you need, when you need it.  We recommend signing up for the patient portal called "MyChart".  Sign up information is provided on this After Visit Summary.  MyChart is used to connect with patients for Virtual Visits (Telemedicine).  Patients are able to view lab/test results, encounter notes, upcoming appointments, etc.  Non-urgent messages can be sent to your provider as well.   To learn more about what you can do with MyChart, go to [email or web address].    Your next appointment:   12 month(s)  The format for your next appointment:   In Person  Provider:   You may see Dr. Johnsie Cancel or one of the following Advanced Practice Providers on your designated Care Team:   Cecilie Kicks, NP  You have been referred to [facility].

## 2021-01-15 DIAGNOSIS — E785 Hyperlipidemia, unspecified: Secondary | ICD-10-CM | POA: Diagnosis not present

## 2021-01-15 DIAGNOSIS — E119 Type 2 diabetes mellitus without complications: Secondary | ICD-10-CM | POA: Diagnosis not present

## 2021-01-20 ENCOUNTER — Ambulatory Visit: Payer: Medicare HMO

## 2021-01-26 DIAGNOSIS — K219 Gastro-esophageal reflux disease without esophagitis: Secondary | ICD-10-CM | POA: Diagnosis not present

## 2021-01-26 DIAGNOSIS — M545 Low back pain, unspecified: Secondary | ICD-10-CM | POA: Diagnosis not present

## 2021-01-26 DIAGNOSIS — J040 Acute laryngitis: Secondary | ICD-10-CM | POA: Diagnosis not present

## 2021-01-26 DIAGNOSIS — R3915 Urgency of urination: Secondary | ICD-10-CM | POA: Diagnosis not present

## 2021-02-18 DIAGNOSIS — M461 Sacroiliitis, not elsewhere classified: Secondary | ICD-10-CM | POA: Diagnosis not present

## 2021-02-18 DIAGNOSIS — M9903 Segmental and somatic dysfunction of lumbar region: Secondary | ICD-10-CM | POA: Diagnosis not present

## 2021-02-18 DIAGNOSIS — M9905 Segmental and somatic dysfunction of pelvic region: Secondary | ICD-10-CM | POA: Diagnosis not present

## 2021-02-18 DIAGNOSIS — M48062 Spinal stenosis, lumbar region with neurogenic claudication: Secondary | ICD-10-CM | POA: Diagnosis not present

## 2021-02-18 DIAGNOSIS — M9902 Segmental and somatic dysfunction of thoracic region: Secondary | ICD-10-CM | POA: Diagnosis not present

## 2021-02-18 DIAGNOSIS — M5136 Other intervertebral disc degeneration, lumbar region: Secondary | ICD-10-CM | POA: Diagnosis not present

## 2021-02-19 DIAGNOSIS — M545 Low back pain, unspecified: Secondary | ICD-10-CM | POA: Diagnosis not present

## 2021-02-19 DIAGNOSIS — N5201 Erectile dysfunction due to arterial insufficiency: Secondary | ICD-10-CM | POA: Diagnosis not present

## 2021-02-19 DIAGNOSIS — N401 Enlarged prostate with lower urinary tract symptoms: Secondary | ICD-10-CM | POA: Diagnosis not present

## 2021-02-19 DIAGNOSIS — R351 Nocturia: Secondary | ICD-10-CM | POA: Diagnosis not present

## 2021-02-25 DIAGNOSIS — M9905 Segmental and somatic dysfunction of pelvic region: Secondary | ICD-10-CM | POA: Diagnosis not present

## 2021-02-25 DIAGNOSIS — M9903 Segmental and somatic dysfunction of lumbar region: Secondary | ICD-10-CM | POA: Diagnosis not present

## 2021-02-25 DIAGNOSIS — M5136 Other intervertebral disc degeneration, lumbar region: Secondary | ICD-10-CM | POA: Diagnosis not present

## 2021-02-25 DIAGNOSIS — M48062 Spinal stenosis, lumbar region with neurogenic claudication: Secondary | ICD-10-CM | POA: Diagnosis not present

## 2021-02-25 DIAGNOSIS — M9902 Segmental and somatic dysfunction of thoracic region: Secondary | ICD-10-CM | POA: Diagnosis not present

## 2021-02-25 DIAGNOSIS — M461 Sacroiliitis, not elsewhere classified: Secondary | ICD-10-CM | POA: Diagnosis not present

## 2021-03-03 DIAGNOSIS — M9902 Segmental and somatic dysfunction of thoracic region: Secondary | ICD-10-CM | POA: Diagnosis not present

## 2021-03-03 DIAGNOSIS — M9905 Segmental and somatic dysfunction of pelvic region: Secondary | ICD-10-CM | POA: Diagnosis not present

## 2021-03-03 DIAGNOSIS — M48062 Spinal stenosis, lumbar region with neurogenic claudication: Secondary | ICD-10-CM | POA: Diagnosis not present

## 2021-03-03 DIAGNOSIS — M461 Sacroiliitis, not elsewhere classified: Secondary | ICD-10-CM | POA: Diagnosis not present

## 2021-03-03 DIAGNOSIS — M5136 Other intervertebral disc degeneration, lumbar region: Secondary | ICD-10-CM | POA: Diagnosis not present

## 2021-03-03 DIAGNOSIS — M9903 Segmental and somatic dysfunction of lumbar region: Secondary | ICD-10-CM | POA: Diagnosis not present

## 2021-03-10 ENCOUNTER — Ambulatory Visit (INDEPENDENT_AMBULATORY_CARE_PROVIDER_SITE_OTHER): Payer: Medicare HMO | Admitting: Otolaryngology

## 2021-03-10 ENCOUNTER — Other Ambulatory Visit: Payer: Self-pay

## 2021-03-10 DIAGNOSIS — R490 Dysphonia: Secondary | ICD-10-CM | POA: Diagnosis not present

## 2021-03-10 DIAGNOSIS — H6123 Impacted cerumen, bilateral: Secondary | ICD-10-CM | POA: Diagnosis not present

## 2021-03-10 DIAGNOSIS — L299 Pruritus, unspecified: Secondary | ICD-10-CM | POA: Diagnosis not present

## 2021-03-10 DIAGNOSIS — K219 Gastro-esophageal reflux disease without esophagitis: Secondary | ICD-10-CM

## 2021-03-10 NOTE — Progress Notes (Signed)
HPI: Jose Duarte is a 80 y.o. male who presents is referred by his PCP Dr. Arelia Sneddon for evaluation of hoarseness.  He complains of intermittent hoarseness.  He is actually doing better today and on conversation in the office today he really has only minimal hoarseness.  He also has history of GE reflux disease and takes Protonix in the mornings.  He sometimes takes Mylanta at night which seems to help.  He has more coughing in the morning.  He was diagnosed with acid reflux back in 2006. He used to chew tobacco but stopped this years ago in 2006.Marland Kitchen  He does not smoke.Marland Kitchen He also complains of some itching in the right ear.  Past Medical History:  Diagnosis Date   Arthritis    CAD S/P percutaneous coronary angioplasty - PCI RCA cardiologist-  dr Johnsie Cancel   03-01-2013 Promus Premier DES 3.0 mm x 16 mm - post-dilated to 3.2 mm - Dr. Claiborne Billings    Cataract    Diverticulosis of colon    Family history of adverse reaction to anesthesia    brother --  ponv   GERD (gastroesophageal reflux disease)    History of adenomatous polyp of colon    History of CVA (cerebrovascular accident)    01-06-2009  right posterior temporal and occipital lobe   History of nonmelanoma skin cancer    EXCISION OF EAR   History of ST elevation myocardial infarction (STEMI) 03/01/2013   inferolateral s/p  DES to RI   History of transient ischemic attack (TIA)    2006  approx   HTN (hypertension), benign 03/02/2013   Hyperlipidemia    Hypertension    Incomplete right bundle branch block    Ischemic cardiomyopathy    ef 45-50% per last echo 2016 and myview stress ef 52% 2016   Myocardial infarction ([place])    Nocturia    OSA (obstructive sleep apnea)    study done  approx 2006--  no cpap   Peripheral neuropathy    Phimosis    PONV (postoperative nausea and vomiting)    S/P drug eluting coronary stent placement 03/01/2013   x1 to Ramus   Stroke ([place])    2 "mini" strokes, no residual   Type 2 diabetes mellitus ([place])     Wears glasses    Past Surgical History:  Procedure Laterality Date   CARDIAC CATHETERIZATION  09-18-2004   [facility]   no significant cad, normal LVF, ef 60%   CARDIOVASCULAR STRESS TEST  03-24-2015   dr Johnsie Cancel   Low risk nuclear study w/ no ischemia;  (previous known lateral wall MI) non-reversible large moderate severity defect in the basal and mid inferolateral , basal and mid anterolateral ;  nuclear stress ef 52%   CERVICAL SPINE SURGERY  1980's   CIRCUMCISION N/A 03/26/2016   Procedure: CIRCUMCISION ADULT;  Surgeon: Kathie Rhodes, MD;  Location: [facility];  Service: Urology;  Laterality: N/A;   COLON SURGERY     DIAGNOSTIC LAPAROSCOPY     EXPLORATORY LAPAROTOMY/  RIGHT COLECTOMY/  DRAINAGE INTRA-ABDOMINAL SUBPHRENIC ABSCESS AND EXCISION INFLAMMATORY MESENTERY ABSCESS WITHIN TRANSVERSE COLON  09-01-2009   dr Rise Patience   HERNIA REPAIR  1970's   INGUINAL HERNIA REPAIR Left 09/18/2012   Procedure: LAPAROSCOPIC INGUINAL HERNIA;  Surgeon: Ralene Ok, MD;  Location: [facility];  Service: General;  Laterality: Left;   INGUINAL HERNIA REPAIR Left 07/03/2014   Procedure: LEFT INGUINAL HERNIA REPAIR WITH MESH;  Surgeon: Pedro Earls, MD;  Location: [facility];  Service: General;  Laterality: Left;   INSERTION OF MESH Left 09/18/2012   Procedure: INSERTION OF MESH;  Surgeon: Ralene Ok, MD;  Location: [facility];  Service: General;  Laterality: Left;   LAPAROSCOPIC CHOLECYSTECTOMY  11-22-5   dr Rosana Hoes   LAPAROSCOPIC LYSIS OF ADHESIONS N/A 09/18/2012   Procedure: LAPAROSCOPIC LYSIS OF ADHESIONS;  Surgeon: Ralene Ok, MD;  Location: [facility];  Service: General;  Laterality: N/A;   LEFT HEART CATHETERIZATION WITH CORONARY ANGIOGRAM N/A 03/01/2013   Procedure: LEFT HEART CATHETERIZATION WITH CORONARY ANGIOGRAM;  Surgeon: Troy Sine, MD;  Location: [facility] CATH LAB;  Service: Cardiovascular;  Laterality: N/A;  PTCA, thrombectomy, and DES stent to right proximal ramus intermediate;  diffuse  30-35% RCA,  diffuse 50% LAD;  preserved LVF w/ moderate anterolateral hypocontractility   LOOP RECORDER INSERTION N/A 03/17/2017   Procedure: LOOP RECORDER INSERTION;  Surgeon: Deboraha Sprang, MD;  Location: [place] CV LAB;  Service: Cardiovascular;  Laterality: N/A;   TRANSTHORACIC ECHOCARDIOGRAM  03/24/2015   basal, mid inferor and inferolateral hypokinesis, mild focal basal LVH, ef 16-10%, grade 1 diastolic dysfunction/  mild AV sclerosis without stenosis/  mild AR, MR, and TR/  mild LAE   VENTRAL HERNIA REPAIR N/A 09/18/2012   Procedure: LAPAROSCOPIC VENTRAL HERNIA;  Surgeon: Ralene Ok, MD;  Location: [facility];  Service: General;  Laterality: N/A;   Social History   Socioeconomic History   Marital status: Married    Spouse name: Not on file   Number of children: Not on file   Years of education: Not on file   Highest education level: Not on file  Occupational History   Not on file  Tobacco Use   Smoking status: Never   Smokeless tobacco: Former    Types: Chew   Tobacco comments:    quit 2003  Vaping Use   Vaping Use: Never used  Substance and Sexual Activity   Alcohol use: Yes    Alcohol/week: 1.0 standard drink    Types: 1 Glasses of wine per week    Comment: occasionally   Drug use: No   Sexual activity: Not on file  Other Topics Concern   Not on file  Social History Narrative   Not on file   Social Determinants of Health   Financial Resource Strain: Not on file  Food Insecurity: Not on file  Transportation Needs: Not on file  Physical Activity: Not on file  Stress: Not on file  Social Connections: Not on file   Family History  Problem Relation Age of Onset   Cancer Sister        lung   Allergies  Allergen Reactions   Penicillins Anaphylaxis    Knots on head; tongue swelling Has patient had a PCN reaction causing immediate rash, facial/tongue/throat swelling, SOB or lightheadedness with hypotension: Yes Has patient had a PCN reaction causing  severe rash involving mucus membranes or skin necrosis:No Has patient had a PCN reaction that required hospitalization: Unknown Has patient had a PCN reaction occurring within the last 10 years: No If all of the above answers are "NO", then may proceed with Cephalosporin use.    Prior to Admission medications   Medication Sig Start Date End Date Taking? Authorizing Provider  acetaminophen (TYLENOL) 500 MG tablet Take 1,000 mg by mouth every 6 (six) hours as needed for mild pain or headache.    [provider]  clopidogrel (PLAVIX) 75 MG tablet Take 1 tablet (75 mg total) by mouth  daily. Please keep upcoming appt in July 2022 with Dr. Johnsie Cancel before anymore refills. Thank you 12/16/20   Josue Hector, MD  glipiZIDE (GLUCOTROL) 10 MG tablet Take 10 mg by mouth 2 (two) times daily. 12/16/20   [provider]  losartan (COZAAR) 50 MG tablet Take 1 tablet (50 mg total) by mouth daily. 12/03/20   Josue Hector, MD  metFORMIN (GLUCOPHAGE) 1000 MG tablet Take 1,000 mg by mouth 2 (two) times daily. 10/17/20   [provider]  nitroGLYCERIN (NITROSTAT) 0.4 MG SL tablet DISSOLVE ONE TABLET UNDER THE TONGUE EVERY 5 MINUTES AS NEEDED FOR CHEST PAIN.  DO NOT EXCEED A TOTAL OF 3 DOSES IN 15 MINUTES 01/13/18   Josue Hector, MD  pantoprazole (PROTONIX) 40 MG tablet Take 40 mg by mouth daily. 01/15/15   [provider]     Positive ROS: Otherwise negative  All other systems have been reviewed and were otherwise negative with the exception of those mentioned in the HPI and as above.  Physical Exam: Constitutional: Alert, well-appearing, no acute distress.  Voice sounds reasonably good in the office today.  Perhaps mild raspiness. Ears: External ears without lesions or tenderness.  He had a moderate amount of wax in both ear canals that was cleaned with curettes.  The TMs were clear bilaterally.  No inflammatory changes of the ear canal on either side. Nasal: External nose  without lesions. Septum with mild deformity to the right.  After decongesting nose with Afrin both millimeters regions were clear.  No polyps noted.. Oral: Lips and gums without lesions. Tongue and palate mucosa without lesions. Posterior oropharynx clear.  Tonsil regions are benign in appearance bilaterally.  Indirect laryngoscopy revealed a clear base of tongue vallecula and epiglottis.  The vocal cords were little difficult to visualize. Fiberoptic laryngoscopy was performed through the left nostril.  The nasopharynx was clear.  The base of tongue vallecula epiglottis were normal.  On evaluation the vocal cords both vocal cords were clear bilaterally with no vocal lesions noted.  Vocal cords had symmetric mobility.  Mild arytenoid edema but normal-appearing mucosa otherwise.  Piriform sinuses were clear. Neck: No palpable adenopathy or masses. Respiratory: Breathing comfortably  Skin: No facial/neck lesions or rash noted.  Laryngoscopy  Date/Time: 03/10/2021 4:39 PM Performed by: Rozetta Nunnery, MD Authorized by: Rozetta Nunnery, MD   Consent:    Consent obtained:  Verbal   Consent given by:  Patient Procedure details:    Indications: hoarseness, dysphagia, or aspiration     Medication:  Afrin   Instrument: flexible fiberoptic laryngoscope     Scope location: left nare   Sinus:    Left middle meatus: normal     Left nasopharynx: normal   Mouth:    Oropharynx: normal     Vallecula: normal     Base of tongue: normal     Epiglottis: normal   Throat:    Pyriform sinus: normal     True vocal cords: normal   Comments:     On fiberoptic laryngoscopy the vocal cords are clear bilaterally with normal vocal mobility. Cerumen impaction removal  Date/Time: 03/10/2021 4:41 PM Performed by: Rozetta Nunnery, MD Authorized by: Rozetta Nunnery, MD   Consent:    Consent obtained:  Verbal   Consent given by:  Patient   Risks discussed:  Pain and bleeding Procedure  details:    Location:  L ear and R ear   Procedure type: curette  Post-procedure details:    Inspection:  TM intact and canal normal   Hearing quality:  Improved   Procedure completion:  Tolerated well, no immediate complications Comments:     Patient with a mod amount of wax in both ears that was cleaned with curettes.  TMs were otherwise clear.   Assessment: History of hoarseness most likely related to GE reflux symptoms. Normal vocal cord examination on fiberoptic laryngoscopy  Plan: Reassured patient of normal vocal cord examination and discussed that I think his hoarseness problems are probably related to reflux symptoms.  Would recommend taking Protonix before dinner instead of first thing in the morning as this will provide better nighttime coverage when he is having more of his reflux.  He mostly takes his Mylanta at night and can continue with this as needed.  If reflux symptoms persist consider further evaluation with gastroenterology.   Radene Journey, MD   CC:

## 2021-03-17 DIAGNOSIS — M48062 Spinal stenosis, lumbar region with neurogenic claudication: Secondary | ICD-10-CM | POA: Diagnosis not present

## 2021-03-17 DIAGNOSIS — M461 Sacroiliitis, not elsewhere classified: Secondary | ICD-10-CM | POA: Diagnosis not present

## 2021-03-17 DIAGNOSIS — M9902 Segmental and somatic dysfunction of thoracic region: Secondary | ICD-10-CM | POA: Diagnosis not present

## 2021-03-17 DIAGNOSIS — M5136 Other intervertebral disc degeneration, lumbar region: Secondary | ICD-10-CM | POA: Diagnosis not present

## 2021-03-17 DIAGNOSIS — M9905 Segmental and somatic dysfunction of pelvic region: Secondary | ICD-10-CM | POA: Diagnosis not present

## 2021-03-17 DIAGNOSIS — M9903 Segmental and somatic dysfunction of lumbar region: Secondary | ICD-10-CM | POA: Diagnosis not present

## 2021-03-19 DIAGNOSIS — M1712 Unilateral primary osteoarthritis, left knee: Secondary | ICD-10-CM | POA: Diagnosis not present

## 2021-03-26 DIAGNOSIS — K2100 Gastro-esophageal reflux disease with esophagitis, without bleeding: Secondary | ICD-10-CM | POA: Diagnosis not present

## 2021-03-27 ENCOUNTER — Other Ambulatory Visit: Payer: Self-pay | Admitting: Cardiovascular Disease

## 2021-04-02 DIAGNOSIS — M48062 Spinal stenosis, lumbar region with neurogenic claudication: Secondary | ICD-10-CM | POA: Diagnosis not present

## 2021-04-02 DIAGNOSIS — M461 Sacroiliitis, not elsewhere classified: Secondary | ICD-10-CM | POA: Diagnosis not present

## 2021-04-02 DIAGNOSIS — M9902 Segmental and somatic dysfunction of thoracic region: Secondary | ICD-10-CM | POA: Diagnosis not present

## 2021-04-02 DIAGNOSIS — M9903 Segmental and somatic dysfunction of lumbar region: Secondary | ICD-10-CM | POA: Diagnosis not present

## 2021-04-02 DIAGNOSIS — M5136 Other intervertebral disc degeneration, lumbar region: Secondary | ICD-10-CM | POA: Diagnosis not present

## 2021-04-02 DIAGNOSIS — M9905 Segmental and somatic dysfunction of pelvic region: Secondary | ICD-10-CM | POA: Diagnosis not present

## 2021-04-06 DIAGNOSIS — Z23 Encounter for immunization: Secondary | ICD-10-CM | POA: Diagnosis not present

## 2021-04-09 DIAGNOSIS — M48062 Spinal stenosis, lumbar region with neurogenic claudication: Secondary | ICD-10-CM | POA: Diagnosis not present

## 2021-04-09 DIAGNOSIS — M9902 Segmental and somatic dysfunction of thoracic region: Secondary | ICD-10-CM | POA: Diagnosis not present

## 2021-04-09 DIAGNOSIS — M9903 Segmental and somatic dysfunction of lumbar region: Secondary | ICD-10-CM | POA: Diagnosis not present

## 2021-04-09 DIAGNOSIS — M9905 Segmental and somatic dysfunction of pelvic region: Secondary | ICD-10-CM | POA: Diagnosis not present

## 2021-04-09 DIAGNOSIS — M5136 Other intervertebral disc degeneration, lumbar region: Secondary | ICD-10-CM | POA: Diagnosis not present

## 2021-04-09 DIAGNOSIS — M461 Sacroiliitis, not elsewhere classified: Secondary | ICD-10-CM | POA: Diagnosis not present

## 2021-04-16 DIAGNOSIS — M9902 Segmental and somatic dysfunction of thoracic region: Secondary | ICD-10-CM | POA: Diagnosis not present

## 2021-04-16 DIAGNOSIS — M461 Sacroiliitis, not elsewhere classified: Secondary | ICD-10-CM | POA: Diagnosis not present

## 2021-04-16 DIAGNOSIS — M48062 Spinal stenosis, lumbar region with neurogenic claudication: Secondary | ICD-10-CM | POA: Diagnosis not present

## 2021-04-16 DIAGNOSIS — M9903 Segmental and somatic dysfunction of lumbar region: Secondary | ICD-10-CM | POA: Diagnosis not present

## 2021-04-16 DIAGNOSIS — M9905 Segmental and somatic dysfunction of pelvic region: Secondary | ICD-10-CM | POA: Diagnosis not present

## 2021-04-16 DIAGNOSIS — M5136 Other intervertebral disc degeneration, lumbar region: Secondary | ICD-10-CM | POA: Diagnosis not present

## 2021-04-30 DIAGNOSIS — M5136 Other intervertebral disc degeneration, lumbar region: Secondary | ICD-10-CM | POA: Diagnosis not present

## 2021-04-30 DIAGNOSIS — M9902 Segmental and somatic dysfunction of thoracic region: Secondary | ICD-10-CM | POA: Diagnosis not present

## 2021-04-30 DIAGNOSIS — M461 Sacroiliitis, not elsewhere classified: Secondary | ICD-10-CM | POA: Diagnosis not present

## 2021-04-30 DIAGNOSIS — M9905 Segmental and somatic dysfunction of pelvic region: Secondary | ICD-10-CM | POA: Diagnosis not present

## 2021-04-30 DIAGNOSIS — M9903 Segmental and somatic dysfunction of lumbar region: Secondary | ICD-10-CM | POA: Diagnosis not present

## 2021-04-30 DIAGNOSIS — M48062 Spinal stenosis, lumbar region with neurogenic claudication: Secondary | ICD-10-CM | POA: Diagnosis not present

## 2021-05-10 ENCOUNTER — Other Ambulatory Visit: Payer: Self-pay | Admitting: Cardiovascular Disease

## 2021-05-21 DIAGNOSIS — M5136 Other intervertebral disc degeneration, lumbar region: Secondary | ICD-10-CM | POA: Diagnosis not present

## 2021-05-21 DIAGNOSIS — M9903 Segmental and somatic dysfunction of lumbar region: Secondary | ICD-10-CM | POA: Diagnosis not present

## 2021-05-21 DIAGNOSIS — M9902 Segmental and somatic dysfunction of thoracic region: Secondary | ICD-10-CM | POA: Diagnosis not present

## 2021-05-21 DIAGNOSIS — M461 Sacroiliitis, not elsewhere classified: Secondary | ICD-10-CM | POA: Diagnosis not present

## 2021-05-21 DIAGNOSIS — M9905 Segmental and somatic dysfunction of pelvic region: Secondary | ICD-10-CM | POA: Diagnosis not present

## 2021-05-21 DIAGNOSIS — M48062 Spinal stenosis, lumbar region with neurogenic claudication: Secondary | ICD-10-CM | POA: Diagnosis not present

## 2021-05-28 DIAGNOSIS — Z86010 Personal history of colonic polyps: Secondary | ICD-10-CM | POA: Diagnosis not present

## 2021-05-28 DIAGNOSIS — K2100 Gastro-esophageal reflux disease with esophagitis, without bleeding: Secondary | ICD-10-CM | POA: Diagnosis not present

## 2021-06-03 DIAGNOSIS — D225 Melanocytic nevi of trunk: Secondary | ICD-10-CM | POA: Diagnosis not present

## 2021-06-03 DIAGNOSIS — Z85828 Personal history of other malignant neoplasm of skin: Secondary | ICD-10-CM | POA: Diagnosis not present

## 2021-06-03 DIAGNOSIS — L821 Other seborrheic keratosis: Secondary | ICD-10-CM | POA: Diagnosis not present

## 2021-06-03 DIAGNOSIS — L570 Actinic keratosis: Secondary | ICD-10-CM | POA: Diagnosis not present

## 2021-06-03 DIAGNOSIS — L814 Other melanin hyperpigmentation: Secondary | ICD-10-CM | POA: Diagnosis not present

## 2021-06-03 DIAGNOSIS — Z08 Encounter for follow-up examination after completed treatment for malignant neoplasm: Secondary | ICD-10-CM | POA: Diagnosis not present

## 2021-06-07 DIAGNOSIS — R059 Cough, unspecified: Secondary | ICD-10-CM | POA: Diagnosis not present

## 2021-06-07 DIAGNOSIS — J3489 Other specified disorders of nose and nasal sinuses: Secondary | ICD-10-CM | POA: Diagnosis not present

## 2021-06-07 DIAGNOSIS — Z20828 Contact with and (suspected) exposure to other viral communicable diseases: Secondary | ICD-10-CM | POA: Diagnosis not present

## 2021-06-18 DIAGNOSIS — M461 Sacroiliitis, not elsewhere classified: Secondary | ICD-10-CM | POA: Diagnosis not present

## 2021-06-18 DIAGNOSIS — M5136 Other intervertebral disc degeneration, lumbar region: Secondary | ICD-10-CM | POA: Diagnosis not present

## 2021-06-18 DIAGNOSIS — M9903 Segmental and somatic dysfunction of lumbar region: Secondary | ICD-10-CM | POA: Diagnosis not present

## 2021-06-18 DIAGNOSIS — M48062 Spinal stenosis, lumbar region with neurogenic claudication: Secondary | ICD-10-CM | POA: Diagnosis not present

## 2021-06-18 DIAGNOSIS — M9902 Segmental and somatic dysfunction of thoracic region: Secondary | ICD-10-CM | POA: Diagnosis not present

## 2021-06-18 DIAGNOSIS — M9905 Segmental and somatic dysfunction of pelvic region: Secondary | ICD-10-CM | POA: Diagnosis not present

## 2021-06-23 ENCOUNTER — Telehealth: Payer: Self-pay | Admitting: *Deleted

## 2021-06-23 NOTE — Telephone Encounter (Signed)
° °  Pre-operative Risk Assessment    Patient Name: Jose Duarte  DOB: 03/19/1941 MRN: [id number]      Request for Surgical Clearance    Procedure:   ENDOSCOPY    Date of Surgery:  Clearance 06/25/21                                 Surgeon:  DR. Watt Climes Surgeon's Group or Practice Name:   [facility] Phone number:   [phone] Fax number:   [phone]   Type of Clearance Requested:   - Pharmacy:  Hold Clopidogrel (Plavix) NOT INDICATED   Type of Anesthesia:   PROPOFOL    Additional requests/questions:      Astrid Divine   06/23/2021, 12:20 PM

## 2021-06-24 NOTE — Telephone Encounter (Signed)
° °  Name: Jose Duarte  DOB: [birth year]  MRN: [id number]   Primary Cardiologist: Jenkins Rouge, MD  Chart reviewed as part of pre-operative protocol coverage. Patient was contacted 06/24/2021 in reference to pre-operative risk assessment for pending surgery as outlined below.  Jose Duarte was last seen on 01/06/2021 by Dr. Johnsie Cancel.  Since that day, Jose Duarte has done well without exertional chest pain or worsening dyspnea.  Therefore, based on ACC/AHA guidelines, the patient would be at acceptable risk for the planned procedure without further cardiovascular testing.   Note, we typically told Plavix for 5 days prior to GI procedure, patient's last dose of plavix was yesterday morning 1/3, procedure is tomorrow 1/5. Talking with Raford Pitcher who is Dr. Perley Jain nurse, they can do the procedure with holding the plavix today and tomorrow morning. I have instructed the patient to start holding plavix until after the procedure. We recommend he restart the plavix as soon as possible afterward at the surgeon's discretion.   The patient was advised that if he develops new symptoms prior to surgery to contact our office to arrange for a follow-up visit, and he verbalized understanding.  I will route this recommendation to the requesting party via Epic fax function and remove from pre-op pool. Please call with questions.  Almyra Deforest, [place] 06/24/2021, 8:58 AM

## 2021-06-25 DIAGNOSIS — K219 Gastro-esophageal reflux disease without esophagitis: Secondary | ICD-10-CM | POA: Diagnosis not present

## 2021-06-25 DIAGNOSIS — K449 Diaphragmatic hernia without obstruction or gangrene: Secondary | ICD-10-CM | POA: Diagnosis not present

## 2021-07-16 DIAGNOSIS — M461 Sacroiliitis, not elsewhere classified: Secondary | ICD-10-CM | POA: Diagnosis not present

## 2021-07-16 DIAGNOSIS — M5136 Other intervertebral disc degeneration, lumbar region: Secondary | ICD-10-CM | POA: Diagnosis not present

## 2021-07-16 DIAGNOSIS — M9902 Segmental and somatic dysfunction of thoracic region: Secondary | ICD-10-CM | POA: Diagnosis not present

## 2021-07-16 DIAGNOSIS — M9905 Segmental and somatic dysfunction of pelvic region: Secondary | ICD-10-CM | POA: Diagnosis not present

## 2021-07-16 DIAGNOSIS — M9903 Segmental and somatic dysfunction of lumbar region: Secondary | ICD-10-CM | POA: Diagnosis not present

## 2021-07-16 DIAGNOSIS — M48062 Spinal stenosis, lumbar region with neurogenic claudication: Secondary | ICD-10-CM | POA: Diagnosis not present

## 2021-07-23 DIAGNOSIS — M9903 Segmental and somatic dysfunction of lumbar region: Secondary | ICD-10-CM | POA: Diagnosis not present

## 2021-07-23 DIAGNOSIS — M461 Sacroiliitis, not elsewhere classified: Secondary | ICD-10-CM | POA: Diagnosis not present

## 2021-07-23 DIAGNOSIS — M9905 Segmental and somatic dysfunction of pelvic region: Secondary | ICD-10-CM | POA: Diagnosis not present

## 2021-07-23 DIAGNOSIS — M9902 Segmental and somatic dysfunction of thoracic region: Secondary | ICD-10-CM | POA: Diagnosis not present

## 2021-07-23 DIAGNOSIS — M5136 Other intervertebral disc degeneration, lumbar region: Secondary | ICD-10-CM | POA: Diagnosis not present

## 2021-07-23 DIAGNOSIS — M48062 Spinal stenosis, lumbar region with neurogenic claudication: Secondary | ICD-10-CM | POA: Diagnosis not present

## 2021-08-05 DIAGNOSIS — E119 Type 2 diabetes mellitus without complications: Secondary | ICD-10-CM | POA: Diagnosis not present

## 2021-08-05 DIAGNOSIS — I2510 Atherosclerotic heart disease of native coronary artery without angina pectoris: Secondary | ICD-10-CM | POA: Diagnosis not present

## 2021-08-05 DIAGNOSIS — E785 Hyperlipidemia, unspecified: Secondary | ICD-10-CM | POA: Diagnosis not present

## 2021-08-13 DIAGNOSIS — M48062 Spinal stenosis, lumbar region with neurogenic claudication: Secondary | ICD-10-CM | POA: Diagnosis not present

## 2021-08-13 DIAGNOSIS — M9905 Segmental and somatic dysfunction of pelvic region: Secondary | ICD-10-CM | POA: Diagnosis not present

## 2021-08-13 DIAGNOSIS — M461 Sacroiliitis, not elsewhere classified: Secondary | ICD-10-CM | POA: Diagnosis not present

## 2021-08-13 DIAGNOSIS — M9903 Segmental and somatic dysfunction of lumbar region: Secondary | ICD-10-CM | POA: Diagnosis not present

## 2021-08-13 DIAGNOSIS — M5136 Other intervertebral disc degeneration, lumbar region: Secondary | ICD-10-CM | POA: Diagnosis not present

## 2021-08-13 DIAGNOSIS — M9902 Segmental and somatic dysfunction of thoracic region: Secondary | ICD-10-CM | POA: Diagnosis not present

## 2021-09-10 DIAGNOSIS — M5136 Other intervertebral disc degeneration, lumbar region: Secondary | ICD-10-CM | POA: Diagnosis not present

## 2021-09-10 DIAGNOSIS — M9903 Segmental and somatic dysfunction of lumbar region: Secondary | ICD-10-CM | POA: Diagnosis not present

## 2021-09-10 DIAGNOSIS — M9902 Segmental and somatic dysfunction of thoracic region: Secondary | ICD-10-CM | POA: Diagnosis not present

## 2021-09-10 DIAGNOSIS — M461 Sacroiliitis, not elsewhere classified: Secondary | ICD-10-CM | POA: Diagnosis not present

## 2021-09-10 DIAGNOSIS — M9905 Segmental and somatic dysfunction of pelvic region: Secondary | ICD-10-CM | POA: Diagnosis not present

## 2021-09-10 DIAGNOSIS — M48062 Spinal stenosis, lumbar region with neurogenic claudication: Secondary | ICD-10-CM | POA: Diagnosis not present

## 2021-10-08 DIAGNOSIS — H353110 Nonexudative age-related macular degeneration, right eye, stage unspecified: Secondary | ICD-10-CM | POA: Diagnosis not present

## 2021-10-08 DIAGNOSIS — H31112 Age-related choroidal atrophy, left eye: Secondary | ICD-10-CM | POA: Diagnosis not present

## 2021-10-08 DIAGNOSIS — Z961 Presence of intraocular lens: Secondary | ICD-10-CM | POA: Diagnosis not present

## 2021-10-08 DIAGNOSIS — H524 Presbyopia: Secondary | ICD-10-CM | POA: Diagnosis not present

## 2021-10-08 DIAGNOSIS — H26491 Other secondary cataract, right eye: Secondary | ICD-10-CM | POA: Diagnosis not present

## 2021-10-08 DIAGNOSIS — Z7984 Long term (current) use of oral hypoglycemic drugs: Secondary | ICD-10-CM | POA: Diagnosis not present

## 2021-10-08 DIAGNOSIS — E119 Type 2 diabetes mellitus without complications: Secondary | ICD-10-CM | POA: Diagnosis not present

## 2021-10-13 DIAGNOSIS — M9905 Segmental and somatic dysfunction of pelvic region: Secondary | ICD-10-CM | POA: Diagnosis not present

## 2021-10-13 DIAGNOSIS — M9902 Segmental and somatic dysfunction of thoracic region: Secondary | ICD-10-CM | POA: Diagnosis not present

## 2021-10-13 DIAGNOSIS — M5136 Other intervertebral disc degeneration, lumbar region: Secondary | ICD-10-CM | POA: Diagnosis not present

## 2021-10-13 DIAGNOSIS — M48062 Spinal stenosis, lumbar region with neurogenic claudication: Secondary | ICD-10-CM | POA: Diagnosis not present

## 2021-10-13 DIAGNOSIS — M9903 Segmental and somatic dysfunction of lumbar region: Secondary | ICD-10-CM | POA: Diagnosis not present

## 2021-10-13 DIAGNOSIS — M461 Sacroiliitis, not elsewhere classified: Secondary | ICD-10-CM | POA: Diagnosis not present

## 2021-10-27 DIAGNOSIS — E7800 Pure hypercholesterolemia, unspecified: Secondary | ICD-10-CM | POA: Diagnosis not present

## 2021-10-27 DIAGNOSIS — M545 Low back pain, unspecified: Secondary | ICD-10-CM | POA: Diagnosis not present

## 2021-10-27 DIAGNOSIS — E119 Type 2 diabetes mellitus without complications: Secondary | ICD-10-CM | POA: Diagnosis not present

## 2021-10-27 DIAGNOSIS — R531 Weakness: Secondary | ICD-10-CM | POA: Diagnosis not present

## 2021-11-06 DIAGNOSIS — M48062 Spinal stenosis, lumbar region with neurogenic claudication: Secondary | ICD-10-CM | POA: Diagnosis not present

## 2021-11-06 DIAGNOSIS — M9905 Segmental and somatic dysfunction of pelvic region: Secondary | ICD-10-CM | POA: Diagnosis not present

## 2021-11-06 DIAGNOSIS — M461 Sacroiliitis, not elsewhere classified: Secondary | ICD-10-CM | POA: Diagnosis not present

## 2021-11-06 DIAGNOSIS — M9902 Segmental and somatic dysfunction of thoracic region: Secondary | ICD-10-CM | POA: Diagnosis not present

## 2021-11-06 DIAGNOSIS — M5136 Other intervertebral disc degeneration, lumbar region: Secondary | ICD-10-CM | POA: Diagnosis not present

## 2021-11-06 DIAGNOSIS — S335XXA Sprain of ligaments of lumbar spine, initial encounter: Secondary | ICD-10-CM | POA: Diagnosis not present

## 2021-11-06 DIAGNOSIS — S39012A Strain of muscle, fascia and tendon of lower back, initial encounter: Secondary | ICD-10-CM | POA: Diagnosis not present

## 2021-11-06 DIAGNOSIS — M9903 Segmental and somatic dysfunction of lumbar region: Secondary | ICD-10-CM | POA: Diagnosis not present

## 2021-11-10 DIAGNOSIS — M9902 Segmental and somatic dysfunction of thoracic region: Secondary | ICD-10-CM | POA: Diagnosis not present

## 2021-11-10 DIAGNOSIS — M9905 Segmental and somatic dysfunction of pelvic region: Secondary | ICD-10-CM | POA: Diagnosis not present

## 2021-11-10 DIAGNOSIS — M48062 Spinal stenosis, lumbar region with neurogenic claudication: Secondary | ICD-10-CM | POA: Diagnosis not present

## 2021-11-10 DIAGNOSIS — M461 Sacroiliitis, not elsewhere classified: Secondary | ICD-10-CM | POA: Diagnosis not present

## 2021-11-10 DIAGNOSIS — S39012A Strain of muscle, fascia and tendon of lower back, initial encounter: Secondary | ICD-10-CM | POA: Diagnosis not present

## 2021-11-10 DIAGNOSIS — S335XXA Sprain of ligaments of lumbar spine, initial encounter: Secondary | ICD-10-CM | POA: Diagnosis not present

## 2021-11-10 DIAGNOSIS — M5136 Other intervertebral disc degeneration, lumbar region: Secondary | ICD-10-CM | POA: Diagnosis not present

## 2021-11-10 DIAGNOSIS — M9903 Segmental and somatic dysfunction of lumbar region: Secondary | ICD-10-CM | POA: Diagnosis not present

## 2021-11-18 DIAGNOSIS — M9903 Segmental and somatic dysfunction of lumbar region: Secondary | ICD-10-CM | POA: Diagnosis not present

## 2021-11-18 DIAGNOSIS — M9905 Segmental and somatic dysfunction of pelvic region: Secondary | ICD-10-CM | POA: Diagnosis not present

## 2021-11-18 DIAGNOSIS — M9902 Segmental and somatic dysfunction of thoracic region: Secondary | ICD-10-CM | POA: Diagnosis not present

## 2021-11-18 DIAGNOSIS — M461 Sacroiliitis, not elsewhere classified: Secondary | ICD-10-CM | POA: Diagnosis not present

## 2021-11-18 DIAGNOSIS — S39012A Strain of muscle, fascia and tendon of lower back, initial encounter: Secondary | ICD-10-CM | POA: Diagnosis not present

## 2021-11-18 DIAGNOSIS — M5136 Other intervertebral disc degeneration, lumbar region: Secondary | ICD-10-CM | POA: Diagnosis not present

## 2021-11-18 DIAGNOSIS — M48062 Spinal stenosis, lumbar region with neurogenic claudication: Secondary | ICD-10-CM | POA: Diagnosis not present

## 2021-11-18 DIAGNOSIS — S335XXA Sprain of ligaments of lumbar spine, initial encounter: Secondary | ICD-10-CM | POA: Diagnosis not present

## 2021-12-01 DIAGNOSIS — M9902 Segmental and somatic dysfunction of thoracic region: Secondary | ICD-10-CM | POA: Diagnosis not present

## 2021-12-01 DIAGNOSIS — M9903 Segmental and somatic dysfunction of lumbar region: Secondary | ICD-10-CM | POA: Diagnosis not present

## 2021-12-01 DIAGNOSIS — M48062 Spinal stenosis, lumbar region with neurogenic claudication: Secondary | ICD-10-CM | POA: Diagnosis not present

## 2021-12-01 DIAGNOSIS — S39012A Strain of muscle, fascia and tendon of lower back, initial encounter: Secondary | ICD-10-CM | POA: Diagnosis not present

## 2021-12-01 DIAGNOSIS — M461 Sacroiliitis, not elsewhere classified: Secondary | ICD-10-CM | POA: Diagnosis not present

## 2021-12-01 DIAGNOSIS — S335XXA Sprain of ligaments of lumbar spine, initial encounter: Secondary | ICD-10-CM | POA: Diagnosis not present

## 2021-12-01 DIAGNOSIS — M9905 Segmental and somatic dysfunction of pelvic region: Secondary | ICD-10-CM | POA: Diagnosis not present

## 2021-12-01 DIAGNOSIS — M5136 Other intervertebral disc degeneration, lumbar region: Secondary | ICD-10-CM | POA: Diagnosis not present

## 2021-12-02 DIAGNOSIS — D225 Melanocytic nevi of trunk: Secondary | ICD-10-CM | POA: Diagnosis not present

## 2021-12-02 DIAGNOSIS — L821 Other seborrheic keratosis: Secondary | ICD-10-CM | POA: Diagnosis not present

## 2021-12-02 DIAGNOSIS — L570 Actinic keratosis: Secondary | ICD-10-CM | POA: Diagnosis not present

## 2021-12-02 DIAGNOSIS — L578 Other skin changes due to chronic exposure to nonionizing radiation: Secondary | ICD-10-CM | POA: Diagnosis not present

## 2021-12-02 DIAGNOSIS — L814 Other melanin hyperpigmentation: Secondary | ICD-10-CM | POA: Diagnosis not present

## 2021-12-15 DIAGNOSIS — S335XXA Sprain of ligaments of lumbar spine, initial encounter: Secondary | ICD-10-CM | POA: Diagnosis not present

## 2021-12-15 DIAGNOSIS — M9903 Segmental and somatic dysfunction of lumbar region: Secondary | ICD-10-CM | POA: Diagnosis not present

## 2021-12-15 DIAGNOSIS — M48062 Spinal stenosis, lumbar region with neurogenic claudication: Secondary | ICD-10-CM | POA: Diagnosis not present

## 2021-12-15 DIAGNOSIS — M461 Sacroiliitis, not elsewhere classified: Secondary | ICD-10-CM | POA: Diagnosis not present

## 2021-12-15 DIAGNOSIS — S39012A Strain of muscle, fascia and tendon of lower back, initial encounter: Secondary | ICD-10-CM | POA: Diagnosis not present

## 2021-12-15 DIAGNOSIS — M9905 Segmental and somatic dysfunction of pelvic region: Secondary | ICD-10-CM | POA: Diagnosis not present

## 2021-12-15 DIAGNOSIS — M5136 Other intervertebral disc degeneration, lumbar region: Secondary | ICD-10-CM | POA: Diagnosis not present

## 2021-12-15 DIAGNOSIS — M9902 Segmental and somatic dysfunction of thoracic region: Secondary | ICD-10-CM | POA: Diagnosis not present

## 2021-12-23 DIAGNOSIS — Z86010 Personal history of colonic polyps: Secondary | ICD-10-CM | POA: Diagnosis not present

## 2021-12-23 DIAGNOSIS — R140 Abdominal distension (gaseous): Secondary | ICD-10-CM | POA: Diagnosis not present

## 2021-12-23 DIAGNOSIS — K219 Gastro-esophageal reflux disease without esophagitis: Secondary | ICD-10-CM | POA: Diagnosis not present

## 2021-12-31 ENCOUNTER — Other Ambulatory Visit: Payer: Self-pay | Admitting: Cardiovascular Disease

## 2021-12-31 DIAGNOSIS — M9905 Segmental and somatic dysfunction of pelvic region: Secondary | ICD-10-CM | POA: Diagnosis not present

## 2021-12-31 DIAGNOSIS — M9902 Segmental and somatic dysfunction of thoracic region: Secondary | ICD-10-CM | POA: Diagnosis not present

## 2021-12-31 DIAGNOSIS — S39012A Strain of muscle, fascia and tendon of lower back, initial encounter: Secondary | ICD-10-CM | POA: Diagnosis not present

## 2021-12-31 DIAGNOSIS — S335XXA Sprain of ligaments of lumbar spine, initial encounter: Secondary | ICD-10-CM | POA: Diagnosis not present

## 2021-12-31 DIAGNOSIS — M48062 Spinal stenosis, lumbar region with neurogenic claudication: Secondary | ICD-10-CM | POA: Diagnosis not present

## 2021-12-31 DIAGNOSIS — M9903 Segmental and somatic dysfunction of lumbar region: Secondary | ICD-10-CM | POA: Diagnosis not present

## 2021-12-31 DIAGNOSIS — M5136 Other intervertebral disc degeneration, lumbar region: Secondary | ICD-10-CM | POA: Diagnosis not present

## 2021-12-31 DIAGNOSIS — M461 Sacroiliitis, not elsewhere classified: Secondary | ICD-10-CM | POA: Diagnosis not present

## 2022-02-05 ENCOUNTER — Other Ambulatory Visit: Payer: Self-pay | Admitting: Cardiovascular Disease

## 2022-02-18 DIAGNOSIS — I2510 Atherosclerotic heart disease of native coronary artery without angina pectoris: Secondary | ICD-10-CM | POA: Diagnosis not present

## 2022-02-18 DIAGNOSIS — I10 Essential (primary) hypertension: Secondary | ICD-10-CM | POA: Diagnosis not present

## 2022-02-18 DIAGNOSIS — Z23 Encounter for immunization: Secondary | ICD-10-CM | POA: Diagnosis not present

## 2022-02-18 DIAGNOSIS — E7800 Pure hypercholesterolemia, unspecified: Secondary | ICD-10-CM | POA: Diagnosis not present

## 2022-02-18 DIAGNOSIS — R202 Paresthesia of skin: Secondary | ICD-10-CM | POA: Diagnosis not present

## 2022-02-18 DIAGNOSIS — E118 Type 2 diabetes mellitus with unspecified complications: Secondary | ICD-10-CM | POA: Diagnosis not present

## 2022-02-18 DIAGNOSIS — M545 Low back pain, unspecified: Secondary | ICD-10-CM | POA: Diagnosis not present

## 2022-02-24 DIAGNOSIS — K5900 Constipation, unspecified: Secondary | ICD-10-CM | POA: Diagnosis not present

## 2022-02-24 DIAGNOSIS — R140 Abdominal distension (gaseous): Secondary | ICD-10-CM | POA: Diagnosis not present

## 2022-02-24 DIAGNOSIS — K219 Gastro-esophageal reflux disease without esophagitis: Secondary | ICD-10-CM | POA: Diagnosis not present

## 2022-02-25 DIAGNOSIS — R351 Nocturia: Secondary | ICD-10-CM | POA: Diagnosis not present

## 2022-02-25 DIAGNOSIS — N401 Enlarged prostate with lower urinary tract symptoms: Secondary | ICD-10-CM | POA: Diagnosis not present

## 2022-03-02 DIAGNOSIS — E538 Deficiency of other specified B group vitamins: Secondary | ICD-10-CM | POA: Diagnosis not present

## 2022-03-04 DIAGNOSIS — M47896 Other spondylosis, lumbar region: Secondary | ICD-10-CM | POA: Diagnosis not present

## 2022-03-04 DIAGNOSIS — M545 Low back pain, unspecified: Secondary | ICD-10-CM | POA: Diagnosis not present

## 2022-03-09 DIAGNOSIS — E538 Deficiency of other specified B group vitamins: Secondary | ICD-10-CM | POA: Diagnosis not present

## 2022-03-11 NOTE — Progress Notes (Signed)
Patient ID: Jose Duarte, male   DOB: [birth year], 81 y.o.   MRN: [id number]   81 y.o.  f/u.  Previously seen by [name]. Did not like [facility] practice. Wanted to change to [place]. On 03/01/13 He was taken acutely to the cath lab which revealed a totally occluded ramus branch. This was stented with a 3.0x16 mm Promus Premier DES stent postdilated 3.2 mm with 100% occlusion reduced to 0% and resumption of TIMI 3 flow. EF has been 45-50% by echo Last myovue January 2020 with old  Scar no ischemia EF 53%.    Some myalgias with lipitor less with crestor but not on statin now LDL 137 in April 2023   Some peripheral neuropathy affects walking   Echo reviewed 12/09/20 EF 68% mild MR improved   Now off all cholesterol meds Both Crestor and lipitor caused severe myalgias  Taking two oral hypoglycemics now with A1c 7.2 LDL good at 45   Wife passed of pancreatic cancer a few months ago They were married 76 years   ROS: Denies fever, malais, weight loss, blurry vision, decreased visual acuity, cough, sputum, SOB, hemoptysis, pleuritic pain, palpitaitons, heartburn, abdominal pain, melena, lower extremity edema, claudication, or rash.  All other systems reviewed and negative  General: Affect appropriate Healthy:  appears stated age 4: normal Neck supple with no adenopathy JVP normal no bruits no thyromegaly Lungs clear with no wheezing and good diaphragmatic motion Heart:  S1/S2 no murmur, no rub, gallop or click PMI normal Abdomen: benighn, BS positve, no tenderness, no AAA recent left inguinal hernia repair  no bruit.  No HSM or HJR Distal pulses intact with no bruits No edema Neuro non-focal Skin warm and dry No muscular weakness   Current Outpatient Medications  Medication Sig Dispense Refill   acetaminophen (TYLENOL) 500 MG tablet Take 1,000 mg by mouth every 6 (six) hours as needed for mild pain or headache.     clopidogrel (PLAVIX) 75 MG tablet Take 1 tablet (75 mg total) by  mouth daily. 30 tablet 2   losartan (COZAAR) 50 MG tablet Take 1 tablet by mouth once daily (Patient taking differently: Take 100 mg by mouth daily.) 90 tablet 2   pantoprazole (PROTONIX) 40 MG tablet Take 40 mg by mouth daily.     glipiZIDE (GLUCOTROL XL) 10 MG 24 hr tablet Take 1 tablet by mouth daily with breakfast.     Lancets (ONETOUCH DELICA PLUS [id number]) MISC SMARTSIG:1 Topical Daily     metFORMIN (GLUCOPHAGE) 1000 MG tablet Take 1,000 mg by mouth 2 (two) times daily. (Patient not taking: Reported on 03/24/2022)     nitroGLYCERIN (NITROSTAT) 0.4 MG SL tablet DISSOLVE ONE TABLET UNDER THE TONGUE EVERY 5 MINUTES AS NEEDED FOR CHEST PAIN.  DO NOT EXCEED A TOTAL OF 3 DOSES IN 15 MINUTES (Patient not taking: Reported on 03/24/2022) 25 tablet 3   ONETOUCH VERIO test strip 1 each daily.     rosuvastatin (CRESTOR) 10 MG tablet Take 1 tablet (10 mg total) by mouth daily. 90 tablet 3   No current facility-administered medications for this visit.    Allergies  Penicillins  Electrocardiogram:  03/24/2022 rate 55 RBBB LAD no acute changes 03/24/2022 SR rate 50 RBBB LAFB  Assessment and Plan CAD:  Stent to ramus in 2014 no ischemia on myovue 06/23/18 continue medical RX HTN:  Well controlled.  Continue current medications and low sodium Dash type diet.   DCM:  Ischemic EF normalized by TTE 12/09/20 68% continue  medical Rx  Chol:  LDL 45 at goal continue crestor l  Prostate:  F/u urology flomax d/c ? 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  Target hemoglobin A1c is 6.5 or less.  Continue current medications.    F/U with me in  A year    Jenkins Rouge

## 2022-03-12 DIAGNOSIS — M545 Low back pain, unspecified: Secondary | ICD-10-CM | POA: Diagnosis not present

## 2022-03-16 DIAGNOSIS — E538 Deficiency of other specified B group vitamins: Secondary | ICD-10-CM | POA: Diagnosis not present

## 2022-03-22 DIAGNOSIS — M5416 Radiculopathy, lumbar region: Secondary | ICD-10-CM | POA: Diagnosis not present

## 2022-03-23 DIAGNOSIS — I2510 Atherosclerotic heart disease of native coronary artery without angina pectoris: Secondary | ICD-10-CM | POA: Diagnosis not present

## 2022-03-23 DIAGNOSIS — I10 Essential (primary) hypertension: Secondary | ICD-10-CM | POA: Diagnosis not present

## 2022-03-23 DIAGNOSIS — E538 Deficiency of other specified B group vitamins: Secondary | ICD-10-CM | POA: Diagnosis not present

## 2022-03-23 DIAGNOSIS — E118 Type 2 diabetes mellitus with unspecified complications: Secondary | ICD-10-CM | POA: Diagnosis not present

## 2022-03-24 ENCOUNTER — Encounter: Payer: Self-pay | Admitting: Cardiovascular Disease

## 2022-03-24 ENCOUNTER — Ambulatory Visit: Payer: Medicare HMO | Attending: Cardiovascular Disease | Admitting: Cardiovascular Disease

## 2022-03-24 VITALS — BP 126/70 | HR 50 | Ht 71.0 in | Wt 186.0 lb

## 2022-03-24 DIAGNOSIS — E782 Mixed hyperlipidemia: Secondary | ICD-10-CM | POA: Diagnosis not present

## 2022-03-24 DIAGNOSIS — I10 Essential (primary) hypertension: Secondary | ICD-10-CM | POA: Diagnosis not present

## 2022-03-24 DIAGNOSIS — E118 Type 2 diabetes mellitus with unspecified complications: Secondary | ICD-10-CM | POA: Diagnosis not present

## 2022-03-24 DIAGNOSIS — I2510 Atherosclerotic heart disease of native coronary artery without angina pectoris: Secondary | ICD-10-CM

## 2022-03-24 DIAGNOSIS — I255 Ischemic cardiomyopathy: Secondary | ICD-10-CM

## 2022-03-24 MED ORDER — ROSUVASTATIN CALCIUM 10 MG PO TABS: 10.0000 mg | ORAL_TABLET | Freq: Every day | ORAL | 3 refills | Status: DC

## 2022-03-24 NOTE — Patient Instructions (Signed)
Medication Instructions:  Your physician recommends that you continue on your current medications as directed. Please refer to the Current Medication list given to you today.  *If you need a refill on your cardiac medications before your next appointment, please call your pharmacy*  Lab Work: If you have labs (blood work) drawn today and your tests are completely normal, you will receive your results only by: [place] (if you have MyChart) OR A paper copy in the mail If you have any lab test that is abnormal or we need to change your treatment, we will call you to review the results.  Testing/Procedures: NONE ORDERED TODAY.  Follow-Up: At [facility], you and your health needs are our priority.  As part of our continuing mission to provide you with exceptional heart care, we have created designated Provider Care Teams.  These Care Teams include your primary Cardiologist (physician) and Advanced Practice Providers (APPs -  Physician Assistants and Nurse Practitioners) who all work together to provide you with the care you need, when you need it.  We recommend signing up for the patient portal called "MyChart".  Sign up information is provided on this After Visit Summary.  MyChart is used to connect with patients for Virtual Visits (Telemedicine).  Patients are able to view lab/test results, encounter notes, upcoming appointments, etc.  Non-urgent messages can be sent to your provider as well.   To learn more about what you can do with MyChart, go to [email or web address].    Your next appointment:   March  The format for your next appointment:   In Person  Provider:   Jenkins Rouge, MD      Important Information About Sugar

## 2022-04-06 DIAGNOSIS — M5416 Radiculopathy, lumbar region: Secondary | ICD-10-CM | POA: Diagnosis not present

## 2022-04-20 DIAGNOSIS — M48061 Spinal stenosis, lumbar region without neurogenic claudication: Secondary | ICD-10-CM | POA: Diagnosis not present

## 2022-04-20 DIAGNOSIS — M5416 Radiculopathy, lumbar region: Secondary | ICD-10-CM | POA: Diagnosis not present

## 2022-04-21 ENCOUNTER — Other Ambulatory Visit: Payer: Self-pay | Admitting: Cardiovascular Disease

## 2022-05-03 DIAGNOSIS — M5416 Radiculopathy, lumbar region: Secondary | ICD-10-CM | POA: Diagnosis not present

## 2022-05-20 DIAGNOSIS — I10 Essential (primary) hypertension: Secondary | ICD-10-CM | POA: Diagnosis not present

## 2022-05-20 DIAGNOSIS — G608 Other hereditary and idiopathic neuropathies: Secondary | ICD-10-CM | POA: Diagnosis not present

## 2022-05-20 DIAGNOSIS — N529 Male erectile dysfunction, unspecified: Secondary | ICD-10-CM | POA: Diagnosis not present

## 2022-05-20 DIAGNOSIS — E118 Type 2 diabetes mellitus with unspecified complications: Secondary | ICD-10-CM | POA: Diagnosis not present

## 2022-05-20 DIAGNOSIS — E7800 Pure hypercholesterolemia, unspecified: Secondary | ICD-10-CM | POA: Diagnosis not present

## 2022-05-20 DIAGNOSIS — I2510 Atherosclerotic heart disease of native coronary artery without angina pectoris: Secondary | ICD-10-CM | POA: Diagnosis not present

## 2022-05-20 DIAGNOSIS — E538 Deficiency of other specified B group vitamins: Secondary | ICD-10-CM | POA: Diagnosis not present

## 2022-06-16 DIAGNOSIS — N5201 Erectile dysfunction due to arterial insufficiency: Secondary | ICD-10-CM | POA: Diagnosis not present

## 2022-06-16 DIAGNOSIS — N401 Enlarged prostate with lower urinary tract symptoms: Secondary | ICD-10-CM | POA: Diagnosis not present

## 2022-06-16 DIAGNOSIS — N451 Epididymitis: Secondary | ICD-10-CM | POA: Diagnosis not present

## 2022-06-16 DIAGNOSIS — R351 Nocturia: Secondary | ICD-10-CM | POA: Diagnosis not present

## 2022-07-06 DIAGNOSIS — M9903 Segmental and somatic dysfunction of lumbar region: Secondary | ICD-10-CM | POA: Diagnosis not present

## 2022-07-06 DIAGNOSIS — M9905 Segmental and somatic dysfunction of pelvic region: Secondary | ICD-10-CM | POA: Diagnosis not present

## 2022-07-06 DIAGNOSIS — M48062 Spinal stenosis, lumbar region with neurogenic claudication: Secondary | ICD-10-CM | POA: Diagnosis not present

## 2022-07-06 DIAGNOSIS — M9902 Segmental and somatic dysfunction of thoracic region: Secondary | ICD-10-CM | POA: Diagnosis not present

## 2022-07-06 DIAGNOSIS — M5136 Other intervertebral disc degeneration, lumbar region: Secondary | ICD-10-CM | POA: Diagnosis not present

## 2022-07-06 DIAGNOSIS — M461 Sacroiliitis, not elsewhere classified: Secondary | ICD-10-CM | POA: Diagnosis not present

## 2022-07-13 DIAGNOSIS — M9902 Segmental and somatic dysfunction of thoracic region: Secondary | ICD-10-CM | POA: Diagnosis not present

## 2022-07-13 DIAGNOSIS — M9903 Segmental and somatic dysfunction of lumbar region: Secondary | ICD-10-CM | POA: Diagnosis not present

## 2022-07-13 DIAGNOSIS — M9905 Segmental and somatic dysfunction of pelvic region: Secondary | ICD-10-CM | POA: Diagnosis not present

## 2022-07-13 DIAGNOSIS — M48062 Spinal stenosis, lumbar region with neurogenic claudication: Secondary | ICD-10-CM | POA: Diagnosis not present

## 2022-07-13 DIAGNOSIS — M461 Sacroiliitis, not elsewhere classified: Secondary | ICD-10-CM | POA: Diagnosis not present

## 2022-07-13 DIAGNOSIS — M5136 Other intervertebral disc degeneration, lumbar region: Secondary | ICD-10-CM | POA: Diagnosis not present

## 2022-07-15 DIAGNOSIS — E538 Deficiency of other specified B group vitamins: Secondary | ICD-10-CM | POA: Diagnosis not present

## 2022-07-15 DIAGNOSIS — M4807 Spinal stenosis, lumbosacral region: Secondary | ICD-10-CM | POA: Diagnosis not present

## 2022-07-15 DIAGNOSIS — I10 Essential (primary) hypertension: Secondary | ICD-10-CM | POA: Diagnosis not present

## 2022-07-15 DIAGNOSIS — H9193 Unspecified hearing loss, bilateral: Secondary | ICD-10-CM | POA: Diagnosis not present

## 2022-07-15 DIAGNOSIS — Z0000 Encounter for general adult medical examination without abnormal findings: Secondary | ICD-10-CM | POA: Diagnosis not present

## 2022-07-15 DIAGNOSIS — K219 Gastro-esophageal reflux disease without esophagitis: Secondary | ICD-10-CM | POA: Diagnosis not present

## 2022-07-15 DIAGNOSIS — M792 Neuralgia and neuritis, unspecified: Secondary | ICD-10-CM | POA: Diagnosis not present

## 2022-07-15 DIAGNOSIS — E118 Type 2 diabetes mellitus with unspecified complications: Secondary | ICD-10-CM | POA: Diagnosis not present

## 2022-07-15 DIAGNOSIS — Z86010 Personal history of colonic polyps: Secondary | ICD-10-CM | POA: Diagnosis not present

## 2022-07-15 DIAGNOSIS — N529 Male erectile dysfunction, unspecified: Secondary | ICD-10-CM | POA: Diagnosis not present

## 2022-07-15 DIAGNOSIS — E7800 Pure hypercholesterolemia, unspecified: Secondary | ICD-10-CM | POA: Diagnosis not present

## 2022-07-15 DIAGNOSIS — R634 Abnormal weight loss: Secondary | ICD-10-CM | POA: Diagnosis not present

## 2022-07-19 DIAGNOSIS — M9903 Segmental and somatic dysfunction of lumbar region: Secondary | ICD-10-CM | POA: Diagnosis not present

## 2022-07-19 DIAGNOSIS — M9902 Segmental and somatic dysfunction of thoracic region: Secondary | ICD-10-CM | POA: Diagnosis not present

## 2022-07-19 DIAGNOSIS — M5136 Other intervertebral disc degeneration, lumbar region: Secondary | ICD-10-CM | POA: Diagnosis not present

## 2022-07-19 DIAGNOSIS — M48062 Spinal stenosis, lumbar region with neurogenic claudication: Secondary | ICD-10-CM | POA: Diagnosis not present

## 2022-07-19 DIAGNOSIS — M9905 Segmental and somatic dysfunction of pelvic region: Secondary | ICD-10-CM | POA: Diagnosis not present

## 2022-07-19 DIAGNOSIS — M461 Sacroiliitis, not elsewhere classified: Secondary | ICD-10-CM | POA: Diagnosis not present

## 2022-07-22 DIAGNOSIS — D225 Melanocytic nevi of trunk: Secondary | ICD-10-CM | POA: Diagnosis not present

## 2022-07-22 DIAGNOSIS — Z08 Encounter for follow-up examination after completed treatment for malignant neoplasm: Secondary | ICD-10-CM | POA: Diagnosis not present

## 2022-07-22 DIAGNOSIS — L821 Other seborrheic keratosis: Secondary | ICD-10-CM | POA: Diagnosis not present

## 2022-07-22 DIAGNOSIS — Z85828 Personal history of other malignant neoplasm of skin: Secondary | ICD-10-CM | POA: Diagnosis not present

## 2022-07-22 DIAGNOSIS — L814 Other melanin hyperpigmentation: Secondary | ICD-10-CM | POA: Diagnosis not present

## 2022-07-22 DIAGNOSIS — L570 Actinic keratosis: Secondary | ICD-10-CM | POA: Diagnosis not present

## 2022-07-28 DIAGNOSIS — M5136 Other intervertebral disc degeneration, lumbar region: Secondary | ICD-10-CM | POA: Diagnosis not present

## 2022-07-28 DIAGNOSIS — M48062 Spinal stenosis, lumbar region with neurogenic claudication: Secondary | ICD-10-CM | POA: Diagnosis not present

## 2022-07-28 DIAGNOSIS — M9902 Segmental and somatic dysfunction of thoracic region: Secondary | ICD-10-CM | POA: Diagnosis not present

## 2022-07-28 DIAGNOSIS — M461 Sacroiliitis, not elsewhere classified: Secondary | ICD-10-CM | POA: Diagnosis not present

## 2022-07-28 DIAGNOSIS — M9903 Segmental and somatic dysfunction of lumbar region: Secondary | ICD-10-CM | POA: Diagnosis not present

## 2022-07-28 DIAGNOSIS — M9905 Segmental and somatic dysfunction of pelvic region: Secondary | ICD-10-CM | POA: Diagnosis not present

## 2022-08-03 DIAGNOSIS — M9903 Segmental and somatic dysfunction of lumbar region: Secondary | ICD-10-CM | POA: Diagnosis not present

## 2022-08-03 DIAGNOSIS — M461 Sacroiliitis, not elsewhere classified: Secondary | ICD-10-CM | POA: Diagnosis not present

## 2022-08-03 DIAGNOSIS — M9902 Segmental and somatic dysfunction of thoracic region: Secondary | ICD-10-CM | POA: Diagnosis not present

## 2022-08-03 DIAGNOSIS — M5136 Other intervertebral disc degeneration, lumbar region: Secondary | ICD-10-CM | POA: Diagnosis not present

## 2022-08-03 DIAGNOSIS — M48062 Spinal stenosis, lumbar region with neurogenic claudication: Secondary | ICD-10-CM | POA: Diagnosis not present

## 2022-08-03 DIAGNOSIS — M9905 Segmental and somatic dysfunction of pelvic region: Secondary | ICD-10-CM | POA: Diagnosis not present

## 2022-08-04 DIAGNOSIS — M6281 Muscle weakness (generalized): Secondary | ICD-10-CM | POA: Diagnosis not present

## 2022-08-04 DIAGNOSIS — R200 Anesthesia of skin: Secondary | ICD-10-CM | POA: Diagnosis not present

## 2022-08-05 ENCOUNTER — Encounter: Payer: Self-pay | Admitting: Neurology

## 2022-08-10 DIAGNOSIS — H9313 Tinnitus, bilateral: Secondary | ICD-10-CM | POA: Diagnosis not present

## 2022-08-10 DIAGNOSIS — H903 Sensorineural hearing loss, bilateral: Secondary | ICD-10-CM | POA: Diagnosis not present

## 2022-08-11 DIAGNOSIS — M5136 Other intervertebral disc degeneration, lumbar region: Secondary | ICD-10-CM | POA: Diagnosis not present

## 2022-08-11 DIAGNOSIS — M48062 Spinal stenosis, lumbar region with neurogenic claudication: Secondary | ICD-10-CM | POA: Diagnosis not present

## 2022-08-11 DIAGNOSIS — M461 Sacroiliitis, not elsewhere classified: Secondary | ICD-10-CM | POA: Diagnosis not present

## 2022-08-11 DIAGNOSIS — M9903 Segmental and somatic dysfunction of lumbar region: Secondary | ICD-10-CM | POA: Diagnosis not present

## 2022-08-11 DIAGNOSIS — M9902 Segmental and somatic dysfunction of thoracic region: Secondary | ICD-10-CM | POA: Diagnosis not present

## 2022-08-11 DIAGNOSIS — M9905 Segmental and somatic dysfunction of pelvic region: Secondary | ICD-10-CM | POA: Diagnosis not present

## 2022-08-12 ENCOUNTER — Telehealth: Payer: Self-pay | Admitting: *Deleted

## 2022-08-12 DIAGNOSIS — K2100 Gastro-esophageal reflux disease with esophagitis, without bleeding: Secondary | ICD-10-CM | POA: Diagnosis not present

## 2022-08-12 DIAGNOSIS — Z86010 Personal history of colonic polyps: Secondary | ICD-10-CM | POA: Diagnosis not present

## 2022-08-12 DIAGNOSIS — R194 Change in bowel habit: Secondary | ICD-10-CM | POA: Diagnosis not present

## 2022-08-12 NOTE — Telephone Encounter (Signed)
   Name: Jose Duarte  DOB: 1941/02/20  MRN: [id number]  Primary Cardiologist: Jenkins Rouge, MD  Chart reviewed as part of pre-operative protocol coverage. The patient has an upcoming visit scheduled with Dr. Johnsie Cancel on 08/30/2022 at which time clearance can be addressed in case there are any issues that would impact surgical recommendations.  Colonoscopy is not scheduled until 10/06/2022 as below. I added preop FYI to appointment note so that provider is aware to address at time of outpatient visit.  Per office protocol the cardiology provider should forward their finalized clearance decision and recommendations regarding antiplatelet therapy to the requesting party below.    I will route this message as FYI to requesting party and remove this message from the preop box as separate preop APP input not needed at this time.   Please call with any questions.  Lenna Sciara, NP  08/12/2022, 1:22 PM

## 2022-08-12 NOTE — Telephone Encounter (Signed)
   Pre-operative Risk Assessment    Patient Name: Jose Duarte  DOB: 1940/10/29 MRN: [id number]      Request for Surgical Clearance    Procedure:   COLONOSCOPY  Date of Surgery:  Clearance 10/06/22                                 Surgeon:  DR. Watt Climes Surgeon's Group or Practice Name:  [facility] Phone number:  [phone] Fax number:  [phone]   Type of Clearance Requested:   - Pharmacy:  Hold Clopidogrel (Plavix) NOT INDICATED HOW LONG   Type of Anesthesia:   PROPOFOL   Additional requests/questions:    Astrid Divine   08/12/2022, 11:13 AM

## 2022-08-16 DIAGNOSIS — N5201 Erectile dysfunction due to arterial insufficiency: Secondary | ICD-10-CM | POA: Diagnosis not present

## 2022-08-16 DIAGNOSIS — N401 Enlarged prostate with lower urinary tract symptoms: Secondary | ICD-10-CM | POA: Diagnosis not present

## 2022-08-16 DIAGNOSIS — R351 Nocturia: Secondary | ICD-10-CM | POA: Diagnosis not present

## 2022-08-16 NOTE — Progress Notes (Signed)
Patient ID: Jose Duarte, male   DOB: 1941-02-26, 82 y.o.   MRN: [id number]   82 y.o.  f/u.  Previously seen by [name]. Did not like [facility] practice. Wanted to change to [place]. On 03/01/13 He was taken acutely to the cath lab which revealed a totally occluded ramus branch. This was stented with a 3.0x16 mm Promus Premier DES stent postdilated 3.2 mm with 100% occlusion reduced to 0% and resumption of TIMI 3 flow. EF has been 45-50% by echo Last myovue January 2020 with old  Scar no ischemia EF 53%.    Some myalgias with lipitor less with crestor but not on statin now LDL 137 in April 2023   Some peripheral neuropathy affects walking   Echo reviewed 12/09/20 EF 68% mild MR improved   Now off all cholesterol meds Both Crestor and lipitor caused severe myalgias  Taking two oral hypoglycemics now with A1c 7.2 LDL good at 86   Wife passed of pancreatic cancer 2023 They were married 61 years   Need to have colonoscopy with Dr Lyndel Pleasure to hold plavix 5 days before procedureHe has a history of esophagitis and colon polyps   Having back problems and what sounds like LE neuropathy  ROS: Denies fever, malais, weight loss, blurry vision, decreased visual acuity, cough, sputum, SOB, hemoptysis, pleuritic pain, palpitaitons, heartburn, abdominal pain, melena, lower extremity edema, claudication, or rash.  All other systems reviewed and negative  General: Affect appropriate Healthy:  appears stated age 82: normal Neck supple with no adenopathy JVP normal no bruits no thyromegaly Lungs clear with no wheezing and good diaphragmatic motion Heart:  S1/S2 no murmur, no rub, gallop or click PMI normal Abdomen: benighn, BS positve, no tenderness, no AAA recent left inguinal hernia repair  no bruit.  No HSM or HJR Distal pulses intact with no bruits No edema Neuro non-focal Skin warm and dry No muscular weakness   Current Outpatient Medications  Medication Sig Dispense Refill    acetaminophen (TYLENOL) 500 MG tablet Take 1,000 mg by mouth every 6 (six) hours as needed for mild pain or headache.     amLODipine (NORVASC) 5 MG tablet Take 1 tablet by mouth once daily for 30 days for 30     Apoaequorin (PREVAGEN) 10 MG CAPS Take by mouth.     clopidogrel (PLAVIX) 75 MG tablet TAKE 1 TABLET BY MOUTH ONCE DAILY MUST  KEEP  UPCOMING  APPOINTMENT  IN  OCTOBER  2023  WITH  DR  Johnsie Cancel  BEFORE  ANYMORE  REFILLS 90 tablet 3   DULoxetine (CYMBALTA) 30 MG capsule 1 capsule Orally Once a day     glipiZIDE (GLUCOTROL XL) 10 MG 24 hr tablet Take 1 tablet by mouth daily with breakfast.     JARDIANCE 25 MG TABS tablet Take 1 tablet by mouth once daily for 30 days for 30     losartan (COZAAR) 50 MG tablet Take 1 tablet by mouth once daily (Patient taking differently: Take 100 mg by mouth daily.) 90 tablet 2   nitroGLYCERIN (NITROSTAT) 0.4 MG SL tablet DISSOLVE ONE TABLET UNDER THE TONGUE EVERY 5 MINUTES AS NEEDED FOR CHEST PAIN.  DO NOT EXCEED A TOTAL OF 3 DOSES IN 15 MINUTES 25 tablet 3   pantoprazole (PROTONIX) 40 MG tablet Take 40 mg by mouth daily.     rosuvastatin (CRESTOR) 10 MG tablet Take 1 tablet (10 mg total) by mouth daily. 90 tablet 3   Cholecalciferol 50 MCG (2000 UT)  CAPS as directed Orally (Patient not taking: Reported on 08/25/2022)     Lancets (ONETOUCH DELICA PLUS [id number]) MISC SMARTSIG:1 Topical Daily     ONETOUCH VERIO test strip 1 each daily.     No current facility-administered medications for this visit.    Allergies  Penicillins  Electrocardiogram: 08/30/2022 SR rate 53 RBBB LAFB  Assessment and Plan CAD:  Stent to ramus in 2014 no ischemia on myovue 06/23/18 continue medical RX HTN:  Well controlled.  Continue current medications and low sodium Dash type diet.   DCM:  Ischemic EF normalized by TTE 12/09/20 68% continue medical Rx  Chol:  LDL 45 at goal continue crestor l  Prostate:  F/u urology flomax d/c ? Due to headaches  Neuropathy:  From DM affecting feet  discuss with primary use of neurontin DM:  Discussed low carb diet.  Target hemoglobin A1c is 6.5 or less.  Continue current medications. Preoperative:  ok to hold plavix 5 days before colonoscopy with Dr Watt Climes  Bifasicular Block:  stable avoid beta blockers higher risk for needing PPM in future    F/U with me in  A year    Jenkins Rouge

## 2022-08-17 DIAGNOSIS — M9902 Segmental and somatic dysfunction of thoracic region: Secondary | ICD-10-CM | POA: Diagnosis not present

## 2022-08-17 DIAGNOSIS — M9905 Segmental and somatic dysfunction of pelvic region: Secondary | ICD-10-CM | POA: Diagnosis not present

## 2022-08-17 DIAGNOSIS — M461 Sacroiliitis, not elsewhere classified: Secondary | ICD-10-CM | POA: Diagnosis not present

## 2022-08-17 DIAGNOSIS — M48062 Spinal stenosis, lumbar region with neurogenic claudication: Secondary | ICD-10-CM | POA: Diagnosis not present

## 2022-08-17 DIAGNOSIS — M9903 Segmental and somatic dysfunction of lumbar region: Secondary | ICD-10-CM | POA: Diagnosis not present

## 2022-08-17 DIAGNOSIS — M5136 Other intervertebral disc degeneration, lumbar region: Secondary | ICD-10-CM | POA: Diagnosis not present

## 2022-08-25 ENCOUNTER — Ambulatory Visit: Payer: Medicare HMO | Admitting: Neurology

## 2022-08-25 ENCOUNTER — Encounter: Payer: Self-pay | Admitting: Neurology

## 2022-08-25 VITALS — BP 158/67 | HR 61 | Ht 71.0 in | Wt 184.0 lb

## 2022-08-25 DIAGNOSIS — R292 Abnormal reflex: Secondary | ICD-10-CM | POA: Diagnosis not present

## 2022-08-25 DIAGNOSIS — Z9889 Other specified postprocedural states: Secondary | ICD-10-CM

## 2022-08-25 DIAGNOSIS — R2681 Unsteadiness on feet: Secondary | ICD-10-CM | POA: Diagnosis not present

## 2022-08-25 DIAGNOSIS — R29898 Other symptoms and signs involving the musculoskeletal system: Secondary | ICD-10-CM

## 2022-08-25 NOTE — Progress Notes (Signed)
[facility] Clinic Note - Initial Visit   Date: 08/25/2022   Jose Duarte MRN: [id number] DOB: [birth year]   Dear Dr. Mannie Stabile:  Thank you for your kind referral of Jose Duarte for consultation of bilateral leg weakness. Although his history is well known to you, please allow [place] to reiterate it for the purpose of our medical record. The patient was accompanied to the clinic by self.   Jose Duarte is a 82 y.o. right-handed male with CAD, diabetes mellitus (HbA1c 7.9), hypertension, hyperlipidemia, GERD, s/p cervical surgery (1970s), and TIA presenting for evaluation of bilateral leg weakness and falls.   IMPRESSION/PLAN: Gait instability, falls, and leg weakness.  Imaging of the lumbar spine shows multiple level degenerative spondylosis, with notable central canal stenosis at 4-5 which is moderate as well as multilevel foraminal stenosis.  His exam today shows mild left sided weakness and generalized hyperreflexia.  Although this may be explained by his compressive pathology in the lumbar spine, there is also brisk reflexes in the upper extremities which raises the possibility of cervical canal stenosis.  I will check MRI cervical spine wo contrast to evaluate for this.  He has history of cervical surgery in the 1970s.  He has completed PT with no benefit.   Further recommendations pending results.   ------------------------------------------------------------- History of present illness: Starting in 2020, he began having low back pain and initially was seen by Dr. Gladstone Lighter and had epidural steroid injections. He is currently under the care of Dr. Nelva Bush and [facility] in January for low back pain.  He is also seeing a chiropractor who suggested he see neurology.  In February, he fell 3 times because of loss of balance.  He feels that his legs cannot keep up with what his body wants to do.  He walks unassisted.  He has throbbing pain in the great toe  bilaterally.  He also has numbness and tingling over the soles of the feet and lower legs, worse on left.    Out-side paper records, electronic medical record, and images have been reviewed where available and summarized as:  MRI lumbar spine 03/12/2022:  Interval T12 superior endplate mild compression deformity without retropulsion, central endplate depression, Schmorl's node deformity with minimal marrow edema. Progression of L1-2 disc narrowing with prominent left-sided degenerative endplate edema with small left interval disc osteophyte paraspinal complex. Similar multilevel moderate to severe facet arthropathy with multilevel spondylolisthesis, multilevel facet effusions, and mild multilevel facet articulating process marrow edema.  Correlate for facet pain. L1-2 progression.  Mild central canal stenosis, mild left L2 anterolateral canal abutment by disc protrusion and moderate to severe left foraminal stenosis without impingement. L2-3 mild progression.  Mild to moderate left foraminal stenosis. L3-4 progression and improvement.  Interval right posterolateral small caudal extrusion with diminished right posterolateral cranial extrusion.  Mild-moderate to moderate central canal stenosis with right L4 anterolateral canal displacement with caudal extrusion and moderate left with similar moderate to severe right foraminal stenosis with right L3 nerve displacement by facet osteophyte. L4-5 unchanged.  Moderate central canal stenosis with bilateral L5 anterolateral canal contact if nerve impingement, greater on the right between facet osteophyte and disc with moderate biforaminal stenosis. L5-S1 moderate to moderate-severe right foraminal stenosis without compression.  Lab Results  Component Value Date   HGBA1C 7.2 (H) 03/01/2013   No results found for: "VITAMINB12" Lab Results  Component Value Date   TSH 0.711 03/01/2013   Lab Results  Component Value Date   ESRSEDRATE  29 (H) 04/05/2013     Past Medical History:  Diagnosis Date   Arthritis    CAD S/P percutaneous coronary angioplasty - PCI RCA cardiologist-  dr Johnsie Cancel   03-01-2013 Promus Premier DES 3.0 mm x 16 mm - post-dilated to 3.2 mm - Dr. Claiborne Billings    Cataract    Diverticulosis of colon    Family history of adverse reaction to anesthesia    brother --  ponv   GERD (gastroesophageal reflux disease)    History of adenomatous polyp of colon    History of CVA (cerebrovascular accident)    01-06-2009  right posterior temporal and occipital lobe   History of nonmelanoma skin cancer    EXCISION OF EAR   History of ST elevation myocardial infarction (STEMI) 03/01/2013   inferolateral s/p  DES to RI   History of transient ischemic attack (TIA)    2006  approx   HTN (hypertension), benign 03/02/2013   Hyperlipidemia    Hypertension    Incomplete right bundle branch block    Ischemic cardiomyopathy    ef 45-50% per last echo 2016 and myview stress ef 52% 2016   Myocardial infarction ([place])    Nocturia    OSA (obstructive sleep apnea)    study done  approx 2006--  no cpap   Peripheral neuropathy    Phimosis    PONV (postoperative nausea and vomiting)    S/P drug eluting coronary stent placement 03/01/2013   x1 to [place]   Stroke ([place])    2 "mini" strokes, no residual   Type 2 diabetes mellitus ([place])    Wears glasses     Past Surgical History:  Procedure Laterality Date   CARDIAC CATHETERIZATION  09-18-2004   [facility]   no significant cad, normal LVF, ef 60%   CARDIOVASCULAR STRESS TEST  03-24-2015   dr Johnsie Cancel   Low risk nuclear study w/ no ischemia;  (previous known lateral wall MI) non-reversible large moderate severity defect in the basal and mid inferolateral , basal and mid anterolateral ;  nuclear stress ef 52%   CERVICAL SPINE SURGERY  1980's   CIRCUMCISION N/A 03/26/2016   Procedure: CIRCUMCISION ADULT;  Surgeon: Kathie Rhodes, MD;  Location: [facility];  Service: Urology;  Laterality: N/A;    COLON SURGERY     DIAGNOSTIC LAPAROSCOPY     EXPLORATORY LAPAROTOMY/  RIGHT COLECTOMY/  DRAINAGE INTRA-ABDOMINAL SUBPHRENIC ABSCESS AND EXCISION INFLAMMATORY MESENTERY ABSCESS WITHIN TRANSVERSE COLON  09-01-2009   dr Rise Patience   HERNIA REPAIR  1970's   INGUINAL HERNIA REPAIR Left 09/18/2012   Procedure: LAPAROSCOPIC INGUINAL HERNIA;  Surgeon: Ralene Ok, MD;  Location: [facility];  Service: General;  Laterality: Left;   INGUINAL HERNIA REPAIR Left 07/03/2014   Procedure: LEFT INGUINAL HERNIA REPAIR WITH MESH;  Surgeon: Pedro Earls, MD;  Location: [facility];  Service: General;  Laterality: Left;   INSERTION OF MESH Left 09/18/2012   Procedure: INSERTION OF MESH;  Surgeon: Ralene Ok, MD;  Location: [facility];  Service: General;  Laterality: Left;   LAPAROSCOPIC CHOLECYSTECTOMY  11-22-5   dr Rosana Hoes   LAPAROSCOPIC LYSIS OF ADHESIONS N/A 09/18/2012   Procedure: LAPAROSCOPIC LYSIS OF ADHESIONS;  Surgeon: Ralene Ok, MD;  Location: [facility];  Service: General;  Laterality: N/A;   LEFT HEART CATHETERIZATION WITH CORONARY ANGIOGRAM N/A 03/01/2013   Procedure: LEFT HEART CATHETERIZATION WITH CORONARY ANGIOGRAM;  Surgeon: Troy Sine, MD;  Location: [facility] C[facility];  Service: Cardiovascular;  Laterality: N/A;  PTCA, thrombectomy, and DES stent to right proximal ramus intermediate;  diffuse 30-35% RCA,  diffuse 50% LAD;  preserved LVF w/ moderate anterolateral hypocontractility   LOOP RECORDER INSERTION N/A 03/17/2017   Procedure: LOOP RECORDER INSERTION;  Surgeon: Deboraha Sprang, MD;  Location: [facility];  Service: Cardiovascular;  Laterality: N/A;   TRANSTHORACIC ECHOCARDIOGRAM  03/24/2015   basal, mid inferor and inferolateral hypokinesis, mild focal basal LVH, ef [id number], grade 1 diastolic dysfunction/  mild AV sclerosis without stenosis/  mild AR, MR, and TR/  mild LAE   VENTRAL HERNIA REPAIR N/A 09/18/2012   Procedure: LAPAROSCOPIC VENTRAL HERNIA;  Surgeon: Ralene Ok, MD;  Location:  [facility];  Service: General;  Laterality: N/A;     Medications:  Outpatient Encounter Medications as of 08/25/2022  Medication Sig   acetaminophen (TYLENOL) 500 MG tablet Take 1,000 mg by mouth every 6 (six) hours as needed for mild pain or headache.   amLODipine (NORVASC) 5 MG tablet Take 1 tablet by mouth once daily for 30 days for 30   Apoaequorin (PREVAGEN) 10 MG CAPS Take by mouth.   clopidogrel (PLAVIX) 75 MG tablet TAKE 1 TABLET BY MOUTH ONCE DAILY MUST  KEEP  UPCOMING  APPOINTMENT  IN  OCTOBER  2023  WITH  DR  Johnsie Cancel  BEFORE  ANYMORE  REFILLS   DULoxetine (CYMBALTA) 30 MG capsule 1 capsule Orally Once a day   glipiZIDE (GLUCOTROL XL) 10 MG 24 hr tablet Take 1 tablet by mouth daily with breakfast.   JARDIANCE 25 MG TABS tablet Take 1 tablet by mouth once daily for 30 days for 30   Lancets (ONETOUCH DELICA PLUS [id number]) MISC SMARTSIG:1 Topical Daily   losartan (COZAAR) 50 MG tablet Take 1 tablet by mouth once daily (Patient taking differently: Take 100 mg by mouth daily.)   nitroGLYCERIN (NITROSTAT) 0.4 MG SL tablet DISSOLVE ONE TABLET UNDER THE TONGUE EVERY 5 MINUTES AS NEEDED FOR CHEST PAIN.  DO NOT EXCEED A TOTAL OF 3 DOSES IN 15 MINUTES   ONETOUCH VERIO test strip 1 each daily.   pantoprazole (PROTONIX) 40 MG tablet Take 40 mg by mouth daily.   rosuvastatin (CRESTOR) 10 MG tablet Take 1 tablet (10 mg total) by mouth daily.   Cholecalciferol 50 MCG (2000 UT) CAPS as directed Orally (Patient not taking: Reported on 08/25/2022)   doxycycline (VIBRAMYCIN) 100 MG capsule Take 100 mg by mouth 2 (two) times daily. (Patient not taking: Reported on 08/25/2022)   metFORMIN (GLUCOPHAGE) 1000 MG tablet Take 1,000 mg by mouth 2 (two) times daily. (Patient not taking: Reported on 03/24/2022)   No facility-administered encounter medications on file as of 08/25/2022.    Allergies:  Allergies  Allergen Reactions   Penicillins Anaphylaxis    Knots on head; tongue swelling Has patient had a PCN  reaction causing immediate rash, facial/tongue/throat swelling, SOB or lightheadedness with hypotension: Yes Has patient had a PCN reaction causing severe rash involving mucus membranes or skin necrosis:No Has patient had a PCN reaction that required hospitalization: Unknown Has patient had a PCN reaction occurring within the last 10 years: No If all of the above answers are "NO", then may proceed with Cephalosporin use.     Family History: Family History  Problem Relation Age of Onset   Cancer Sister        lung    Social History: Social History   Tobacco Use   Smoking status: Never   Smokeless tobacco: Former    Types:  Chew   Tobacco comments:    quit 2003  Vaping Use   Vaping Use: Never used  Substance Use Topics   Alcohol use: Yes    Alcohol/week: 1.0 standard drink of alcohol    Types: 1 Glasses of wine per week    Comment: occasionally   Drug use: No   Social History   Social History Narrative   Right Handed    Lives in a two story home. Lives with alone     Vital Signs:  BP (!) 158/67   Pulse 61   Ht '5\' 11"'$  (1.803 m)   Wt 184 lb (83.5 kg)   SpO2 96%   BMI 25.66 kg/m    Neurological Exam: MENTAL STATUS including orientation to time, place, person, recent and remote memory, attention span and concentration, language, and fund of knowledge is normal.  Speech is not dysarthric.  CRANIAL NERVES: II:  No visual field defects.     III-IV-VI: Pupils equal round and reactive to light.  Normal conjugate, extra-ocular eye movements in all directions of gaze.  No nystagmus.  No ptosis.   V:  Normal facial sensation.    VII:  Normal facial symmetry and movements.   VIII:  Normal hearing and vestibular function.   IX-X:  Normal palatal movement.   XI:  Normal shoulder shrug and head rotation.   XII:  Normal tongue strength and range of motion, no deviation or fasciculation.  MOTOR:  No atrophy, fasciculations or abnormal movements.  No pronator drift.   Upper  Extremity:  Right  Left  Deltoid  5/5   5/5   Biceps  5/5   5/5   Triceps  5/5   5/5   Wrist extensors  5/5   5/5   Wrist flexors  5/5   5/5   Finger extensors  5/5   5/5   Finger flexors  5/5   5/5   Dorsal interossei  5/5   5/5   Abductor pollicis  5/5   5/5   Tone (Ashworth scale)  0  0   Lower Extremity:  Right  Left  Hip flexors  5/5   5-/5   Hip extensors  5/5   5-/5   Adductor 5/5  5-/5  Abductor 5/5  5-/5  Knee flexors  5/5   5-/5   Knee extensors  5/5   5-/5   Dorsiflexors  5/5   5/5   Plantarflexors  5/5   5/5   Toe extensors  5/5   5/5   Toe flexors  5/5   5/5   Tone (Ashworth scale)  0  0   MSRs:                                           Right        Left brachioradialis 2+  2+  biceps 2+  2+  triceps 2+  2+  patellar 3+  3+  ankle jerk 2+  3+  Hoffman no  no  plantar response down  down  Crossed adductors and medial pectoralis reflexes present bilaterally.   SENSORY:  Vibration intact throughout.  Temperature is mildly diminished in the left foot.    COORDINATION/GAIT: Normal finger-to- nose-finger.  Intact rapid alternating movements bilaterally.  Stopped, slow gait with small steps, slightly unsteady.  Unassisted.      Thank you for allowing  me to participate in patient's care.  If I can answer any additional questions, I would be pleased to do so.    Sincerely,    Disaya Walt K. Posey Pronto, DO

## 2022-08-30 ENCOUNTER — Ambulatory Visit: Payer: Medicare HMO | Attending: Cardiovascular Disease | Admitting: Cardiovascular Disease

## 2022-08-30 ENCOUNTER — Encounter: Payer: Self-pay | Admitting: Cardiovascular Disease

## 2022-08-30 VITALS — BP 102/72 | HR 53 | Ht 71.0 in | Wt 181.0 lb

## 2022-08-30 DIAGNOSIS — I420 Dilated cardiomyopathy: Secondary | ICD-10-CM | POA: Diagnosis not present

## 2022-08-30 DIAGNOSIS — I2510 Atherosclerotic heart disease of native coronary artery without angina pectoris: Secondary | ICD-10-CM

## 2022-08-30 DIAGNOSIS — E782 Mixed hyperlipidemia: Secondary | ICD-10-CM | POA: Diagnosis not present

## 2022-08-30 NOTE — Patient Instructions (Addendum)
Medication Instructions:  Your physician recommends that you continue on your current medications as directed. Please refer to the Current Medication list given to you today.  *If you need a refill on your cardiac medications before your next appointment, please call your pharmacy*  Lab Work: If you have labs (blood work) drawn today and your tests are completely normal, you will receive your results only by: MyChart Message (if you have MyChart) OR A paper copy in the mail If you have any lab test that is abnormal or we need to change your treatment, we will call you to review the results.  Testing/Procedures: None ordered today.  Follow-Up: At [facility], you and your health needs are our priority.  As part of our continuing mission to provide you with exceptional heart care, we have created designated Provider Care Teams.  These Care Teams include your primary Cardiologist (physician) and Advanced Practice Providers (APPs -  Physician Assistants and Nurse Practitioners) who all work together to provide you with the care you need, when you need it.  We recommend signing up for the patient portal called "MyChart".  Sign up information is provided on this After Visit Summary.  MyChart is used to connect with patients for Virtual Visits (Telemedicine).  Patients are able to view lab/test results, encounter notes, upcoming appointments, etc.  Non-urgent messages can be sent to your provider as well.   To learn more about what you can do with MyChart, go to [email or web address].    Your next appointment:   6 month(s)  Provider:   Peter Nishan, MD     

## 2022-09-03 ENCOUNTER — Encounter: Payer: Self-pay | Admitting: Neurology

## 2022-09-13 ENCOUNTER — Ambulatory Visit
Admission: RE | Admit: 2022-09-13 | Discharge: 2022-09-13 | Disposition: A | Discharge status: Home / Self Care | Payer: Medicare HMO | Source: Ambulatory Visit | Attending: Neurology | Admitting: Neurology

## 2022-09-13 DIAGNOSIS — M542 Cervicalgia: Secondary | ICD-10-CM | POA: Diagnosis not present

## 2022-09-13 DIAGNOSIS — R292 Abnormal reflex: Secondary | ICD-10-CM

## 2022-09-13 DIAGNOSIS — Z9889 Other specified postprocedural states: Secondary | ICD-10-CM

## 2022-09-15 DIAGNOSIS — E118 Type 2 diabetes mellitus with unspecified complications: Secondary | ICD-10-CM | POA: Diagnosis not present

## 2022-09-15 DIAGNOSIS — E119 Type 2 diabetes mellitus without complications: Secondary | ICD-10-CM | POA: Diagnosis not present

## 2022-09-15 DIAGNOSIS — I2510 Atherosclerotic heart disease of native coronary artery without angina pectoris: Secondary | ICD-10-CM | POA: Diagnosis not present

## 2022-09-15 DIAGNOSIS — I10 Essential (primary) hypertension: Secondary | ICD-10-CM | POA: Diagnosis not present

## 2022-09-15 DIAGNOSIS — M545 Low back pain, unspecified: Secondary | ICD-10-CM | POA: Diagnosis not present

## 2022-09-15 DIAGNOSIS — K219 Gastro-esophageal reflux disease without esophagitis: Secondary | ICD-10-CM | POA: Diagnosis not present

## 2022-09-16 ENCOUNTER — Telehealth: Payer: Self-pay | Admitting: Neurology

## 2022-09-16 DIAGNOSIS — R29898 Other symptoms and signs involving the musculoskeletal system: Secondary | ICD-10-CM

## 2022-09-16 NOTE — Telephone Encounter (Signed)
Called and informed patient of MRI cervical spine which showed degenarative changes with disc bulge and mild cord flattening at C5-6 along with severe foraminal stenosis.  There is no severe central canal stenosis, so I recommend additional testing with EMG legs to evaluate his gait instability, weakness, and falls.

## 2022-09-22 DIAGNOSIS — M9903 Segmental and somatic dysfunction of lumbar region: Secondary | ICD-10-CM | POA: Diagnosis not present

## 2022-09-22 DIAGNOSIS — M9902 Segmental and somatic dysfunction of thoracic region: Secondary | ICD-10-CM | POA: Diagnosis not present

## 2022-09-22 DIAGNOSIS — M48062 Spinal stenosis, lumbar region with neurogenic claudication: Secondary | ICD-10-CM | POA: Diagnosis not present

## 2022-09-22 DIAGNOSIS — M9905 Segmental and somatic dysfunction of pelvic region: Secondary | ICD-10-CM | POA: Diagnosis not present

## 2022-09-22 DIAGNOSIS — M5136 Other intervertebral disc degeneration, lumbar region: Secondary | ICD-10-CM | POA: Diagnosis not present

## 2022-09-22 DIAGNOSIS — M461 Sacroiliitis, not elsewhere classified: Secondary | ICD-10-CM | POA: Diagnosis not present

## 2022-09-29 DIAGNOSIS — M9903 Segmental and somatic dysfunction of lumbar region: Secondary | ICD-10-CM | POA: Diagnosis not present

## 2022-09-29 DIAGNOSIS — M9902 Segmental and somatic dysfunction of thoracic region: Secondary | ICD-10-CM | POA: Diagnosis not present

## 2022-09-29 DIAGNOSIS — M9905 Segmental and somatic dysfunction of pelvic region: Secondary | ICD-10-CM | POA: Diagnosis not present

## 2022-09-29 DIAGNOSIS — M461 Sacroiliitis, not elsewhere classified: Secondary | ICD-10-CM | POA: Diagnosis not present

## 2022-09-29 DIAGNOSIS — M5136 Other intervertebral disc degeneration, lumbar region: Secondary | ICD-10-CM | POA: Diagnosis not present

## 2022-09-29 DIAGNOSIS — M48062 Spinal stenosis, lumbar region with neurogenic claudication: Secondary | ICD-10-CM | POA: Diagnosis not present

## 2022-10-06 DIAGNOSIS — K5730 Diverticulosis of large intestine without perforation or abscess without bleeding: Secondary | ICD-10-CM | POA: Diagnosis not present

## 2022-10-06 DIAGNOSIS — Z86010 Personal history of colonic polyps: Secondary | ICD-10-CM | POA: Diagnosis not present

## 2022-10-06 DIAGNOSIS — Z09 Encounter for follow-up examination after completed treatment for conditions other than malignant neoplasm: Secondary | ICD-10-CM | POA: Diagnosis not present

## 2022-10-06 DIAGNOSIS — Z980 Intestinal bypass and anastomosis status: Secondary | ICD-10-CM | POA: Diagnosis not present

## 2022-10-06 DIAGNOSIS — K649 Unspecified hemorrhoids: Secondary | ICD-10-CM | POA: Diagnosis not present

## 2022-10-06 DIAGNOSIS — D125 Benign neoplasm of sigmoid colon: Secondary | ICD-10-CM | POA: Diagnosis not present

## 2022-10-08 DIAGNOSIS — D125 Benign neoplasm of sigmoid colon: Secondary | ICD-10-CM | POA: Diagnosis not present

## 2022-10-13 DIAGNOSIS — M48062 Spinal stenosis, lumbar region with neurogenic claudication: Secondary | ICD-10-CM | POA: Diagnosis not present

## 2022-10-13 DIAGNOSIS — M461 Sacroiliitis, not elsewhere classified: Secondary | ICD-10-CM | POA: Diagnosis not present

## 2022-10-13 DIAGNOSIS — M9905 Segmental and somatic dysfunction of pelvic region: Secondary | ICD-10-CM | POA: Diagnosis not present

## 2022-10-13 DIAGNOSIS — M9903 Segmental and somatic dysfunction of lumbar region: Secondary | ICD-10-CM | POA: Diagnosis not present

## 2022-10-13 DIAGNOSIS — M9902 Segmental and somatic dysfunction of thoracic region: Secondary | ICD-10-CM | POA: Diagnosis not present

## 2022-10-13 DIAGNOSIS — M5136 Other intervertebral disc degeneration, lumbar region: Secondary | ICD-10-CM | POA: Diagnosis not present

## 2022-10-14 ENCOUNTER — Ambulatory Visit: Payer: Medicare HMO | Admitting: Neurology

## 2022-10-14 DIAGNOSIS — R29898 Other symptoms and signs involving the musculoskeletal system: Secondary | ICD-10-CM | POA: Diagnosis not present

## 2022-10-14 NOTE — Procedures (Signed)
[facility]  [address] [place], [address]  [place], [place] [address] Tel: [phone] Fax: [phone] Test Date:  10/14/2022  Patient: Jose Duarte DOB: [birth year] Physician: Nita Sickle, DO  Sex: Male Height:  Ref Phys: Nita Sickle, DO  ID#: [id number]   Technician:    History: This is a 82 year old referred for evaluation of bilateral leg weakness, low back pain, and gait instability.  NCV & EMG Findings: Extensive electrodiagnostic testing of the right lower extremity and additional studies of the left shows:  Bilateral sural and superficial peroneal sensory responses are absent. Peroneal motor response at the extensor digitorum brevis on the right is absent, and normal at the tibialis anterior.  Left peroneal motor responses are within normal limits.  Bilateral tibial motor responses show reduced amplitude (R2.5, L2.8 mV).   Bilateral tibial H reflex studies are prolonged. Chronic motor axon loss changes are seen affecting the right L5 myotome and bilateral L3-4 myotomes.  There is no evidence of accompanying active denervation.   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   ___________________________ Nita Sickle, DO    Nerve Conduction Studies   Stim Site NR Peak (ms) Norm Peak (ms) O-P Amp (V) Norm O-P Amp  Left Sup Peroneal Anti Sensory (Ant Lat Mall)  32 C  12 cm *NR  <4.6  >3  Right Sup Peroneal Anti Sensory (Ant Lat Mall)  32 C  12 cm *NR  <4.6  >3  Left Sural Anti Sensory (Lat Mall)  32 C  Calf *NR  <4.6  >3  Right Sural Anti Sensory (Lat Mall)  32 C  Calf *NR  <4.6  >3     Stim Site NR Onset (ms) Norm Onset (ms) O-P Amp (mV) Norm O-P Amp Site1 Site2 Delta-0 (ms) Dist (cm) Vel (m/s) Norm Vel (m/s)  Left Peroneal Motor (Ext Dig Brev)  32 C  Ankle    4.4 <6.0 3.0 >2.5 B  Fib Ankle 9.0 38.0 42 >40  B Fib    13.4  2.5  Poplt B Fib 2.3 10.0 43 >40  Poplt    15.7  2.5         Right Peroneal Motor (Ext Dig Brev)  32 C  Ankle *NR  <6.0  >2.5 B Fib Ankle  0.0  >40  B Fib *NR     Poplt B Fib  0.0  >40  Poplt *NR            Left Peroneal TA Motor (Tib Ant)  32 C  Fib Head    3.6 <4.5 4.2 >3 Poplit Fib Head 1.7 10.0 59 >40  Poplit    5.3 <5.7 3.9         Right Peroneal TA Motor (Tib Ant)  32 C  Fib Head    3.0 <4.5 3.3 >3 Poplit Fib Head 2.1 10.0 48 >40  Poplit    5.1 <5.7 3.3         Left Tibial Motor (Abd Hall Brev)  32 C  Ankle    3.9 <6.0 *2.8 >4 Knee Ankle 10.2 42.0 41 >40  Knee    14.1  1.5         Right Tibial Motor (Abd Hall Brev)  32 C  Ankle    4.2 <6.0 *2.5 >4 Knee Ankle 9.7 41.0 42 >40  Knee    13.9  1.9  Electromyography   Side Muscle Ins.Act Fibs Fasc Recrt Amp Dur Poly Activation Comment  Right AntTibialis Nml Nml Nml *2- *1+ *1+ *1+ Nml N/A  Right Gastroc Nml Nml Nml Nml Nml Nml Nml Nml N/A  Right Flex Dig Long Nml Nml Nml *2- *1+ *1+ *1+ Nml N/A  Right RectFemoris Nml Nml Nml *2- *1+ *1+ *1+ Nml N/A  Right BicepsFemS Nml Nml Nml Nml Nml Nml Nml Nml N/A  Right GluteusMed Nml Nml Nml *2- *1+ *1+ *1+ Nml N/A  Left Gastroc Nml Nml Nml Nml Nml Nml Nml Nml N/A  Left AntTibialis Nml Nml Nml *1- *1+ *1+ *1+ Nml N/A  Left Flex Dig Long Nml Nml Nml Nml Nml Nml Nml Nml N/A  Left RectFemoris Nml Nml Nml *2- *1+ *1+ *1+ Nml N/A  Left GluteusMed Nml Nml Nml Nml Nml Nml Nml Nml N/A  Left AdductorLong Nml Nml Nml *1- *1+ *1+ *1+ Nml N/A      Waveforms:

## 2022-10-14 NOTE — Progress Notes (Signed)
Follow-up Visit   Date: 10/14/2022    Jose Duarte MRN: [id number] DOB: [birth year]    Jose Duarte is a 82 y.o. right-handed Caucasian male with CAD, diabetes mellitus (HbA1c 7.9), hypertension, hyperlipidemia, GERD, TIA, and s/p cervical surgery (1970s) returning to the clinic for follow-up of bilateral leg weakness and falls.  The patient was accompanied to the clinic by self.  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  He has completed physical therapy without any benefit.  I would like for him to get a second opinion with [facility].   -------------------------------------------- History of present illness: Starting in 2020, he began having low back pain and initially was seen by Dr. Darrelyn Hillock and had epidural steroid injections. He is currently under the care of Dr. Ethelene Hal and [facility] in January for low back pain.  He is also seeing a chiropractor who suggested he see neurology.  In February, he fell 3 times because of loss of balance.  He feels that his legs cannot keep up with what his body wants to do.  He walks unassisted.  He has throbbing pain in the great toe bilaterally.  He also has numbness and tingling over the soles of the feet and lower legs, worse on left.   UPDATE 10/14/2022:  He is here for EMG.  Since his last visit, he underwent MRI cervical spine which shows cervical spondylsosis with multievel formainal stenosis, however no severe spinal cord pathology.  He is here for EMG to investigate his leg weakness further.  He complains of ongoing low back pain and exertional leg weakness.     Medications:  Current Outpatient Medications on File Prior to Visit  Medication Sig Dispense Refill   acetaminophen (TYLENOL) 500 MG tablet Take 1,000 mg by mouth every 6 (six)  hours as needed for mild pain or headache.     amLODipine (NORVASC) 5 MG tablet Take 1 tablet by mouth once daily for 30 days for 30     Apoaequorin (PREVAGEN) 10 MG CAPS Take by mouth.     Cholecalciferol 50 MCG (2000 UT) CAPS as directed Orally (Patient not taking: Reported on 08/25/2022)     clopidogrel (PLAVIX) 75 MG tablet TAKE 1 TABLET BY MOUTH ONCE DAILY MUST  KEEP  UPCOMING  APPOINTMENT  IN  OCTOBER  2023  WITH  DR  Eden Emms  BEFORE  ANYMORE  REFILLS 90 tablet 3   DULoxetine (CYMBALTA) 30 MG capsule 1 capsule Orally Once a day     glipiZIDE (GLUCOTROL XL) 10 MG 24 hr tablet Take 1 tablet by mouth daily with breakfast.     JARDIANCE 25 MG TABS tablet Take 1 tablet by mouth once daily for 30 days for 30     Lancets (ONETOUCH DELICA PLUS LANCET33G) MISC SMARTSIG:1 Topical Daily     losartan (COZAAR) 50 MG tablet Take 1 tablet by mouth once daily (Patient taking differently: Take 100 mg by mouth daily.) 90 tablet 2   nitroGLYCERIN (NITROSTAT) 0.4 MG SL tablet DISSOLVE ONE TABLET UNDER THE TONGUE EVERY 5 MINUTES AS NEEDED FOR CHEST PAIN.  DO NOT EXCEED A TOTAL OF 3 DOSES IN 15 MINUTES 25 tablet 3   ONETOUCH VERIO test strip 1 each daily.     pantoprazole (PROTONIX) 40 MG tablet Take 40 mg by mouth daily.     rosuvastatin (CRESTOR) 10  MG tablet Take 1 tablet (10 mg total) by mouth daily. 90 tablet 3   No current facility-administered medications on file prior to visit.    Allergies:  Allergies  Allergen Reactions   Penicillins Anaphylaxis    Knots on head; tongue swelling Has patient had a PCN reaction causing immediate rash, facial/tongue/throat swelling, SOB or lightheadedness with hypotension: Yes Has patient had a PCN reaction causing severe rash involving mucus membranes or skin necrosis:No Has patient had a PCN reaction that required hospitalization: Unknown Has patient had a PCN reaction occurring within the last 10 years: No If all of the above answers are "NO", then may proceed with  Cephalosporin use.     Vital Signs:  There were no vitals taken for this visit.  Neurological Exam: MENTAL STATUS including orientation to time, place, person, recent and remote memory, attention span and concentration, language, and fund of knowledge is normal.  Speech is not dysarthric.  CRANIAL NERVES:  Normal conjugate, extra-ocular eye movements in all directions of gaze.  No ptosis .  Face is symmetric. Palate elevates symmetrically.  Tongue is midline.  MOTOR:  No atrophy, fasciculations or abnormal movements.  No pronator drift.   Lower Extremity:  Right  Left  Hip flexors  5/5   4/5   Hip extensors  5/5   5-/5   Adductor 5/5  5-/5  Abductor 5/5  5-/5  Knee flexors  5/5   5-/5   Knee extensors  5/5   5-/5   Dorsiflexors  5/5   5/5   Plantarflexors  5/5   5/5   Toe extensors  5/5   5/5   Toe flexors  5/5   5/5   Tone (Ashworth scale)  0  0   MSRs:                                           Right        Left brachioradialis 2+  2+  biceps 2+  2+  triceps 2+  2+  patellar 2+  3+  ankle jerk 2+  3+  Hoffman no  no  plantar response down  down   SENSORY:  Intact to vibration throughout  COORDINATION/GAIT: Slow, stooped gait with small steps  Data: NCS/EMG of the legs 10/14/2022: Chronic right L5 radiculopathy, moderate. Chronic bilateral L3-4 radiculopathy, moderate. 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  MRI lumbar spine 03/12/2022:  Interval T12 superior endplate mild compression deformity without retropulsion, central endplate depression, Schmorl's node deformity with minimal marrow edema. Progression of L1-2 disc narrowing with prominent left-sided degenerative endplate edema with small left interval disc osteophyte paraspinal complex. Similar multilevel moderate to severe facet arthropathy with multilevel spondylolisthesis, multilevel facet effusions, and mild multilevel facet articulating process  marrow edema.  Correlate for facet pain. L1-2 progression.  Mild central canal stenosis, mild left L2 anterolateral canal abutment by disc protrusion and moderate to severe left foraminal stenosis without impingement. L2-3 mild progression.  Mild to moderate left foraminal stenosis. L3-4 progression and improvement.  Interval right posterolateral small caudal extrusion with diminished right posterolateral cranial extrusion.  Mild-moderate to moderate central canal stenosis with right L4 anterolateral canal displacement with caudal extrusion and moderate left with similar moderate to severe right foraminal stenosis with right L3 nerve displacement by facet osteophyte. L4-5 unchanged.  Moderate central canal  stenosis with bilateral L5 anterolateral canal contact if nerve impingement, greater on the right between facet osteophyte and disc with moderate biforaminal stenosis. L5-S1 moderate to moderate-severe right foraminal stenosis without compression.  MRI cervical spine wo contrast 09/15/2022: 1. Suspected interbody and left-sided interfacetal ankylosis from C3 through C5. 2. Chronic spondylosis at C5-6 and C6-7 with posterior osteophytescovering diffusely bulging disc material and a probable extruded disc fragment behind the C6 vertebral body which could arise from either level. There is resulting mild ventral cord flattening and moderate to severe foraminal narrowing bilaterally at C5-6. CT may be helpful to assess for associated calcification. 3. Mild ventral cord flattening and mild to moderate foraminal narrowing bilaterally at C6-7. 4. No acute osseous findings or abnormal cord signal. 5. Probable chronic small vessel ischemic changes in the pons and dentate nuclei.  Thank you for allowing me to participate in patient's care.  If I can answer any additional questions, I would be pleased to do so.    Sincerely,    Faryn Sieg K. Allena Katz, DO

## 2022-10-20 ENCOUNTER — Telehealth: Payer: Self-pay

## 2022-10-20 DIAGNOSIS — M48061 Spinal stenosis, lumbar region without neurogenic claudication: Secondary | ICD-10-CM

## 2022-10-20 NOTE — Telephone Encounter (Signed)
-----   Message from Glendale Chard, DO sent at 10/15/2022  3:32 PM EDT ----- Please place referral to [facility] for lumbar canal stenosis - request Dr. Lovell Sheehan, next available is fine.  Thank you.

## 2022-10-22 ENCOUNTER — Encounter: Payer: Medicare HMO | Admitting: Neurology

## 2022-10-25 DIAGNOSIS — L600 Ingrowing nail: Secondary | ICD-10-CM | POA: Diagnosis not present

## 2022-10-25 DIAGNOSIS — M2042 Other hammer toe(s) (acquired), left foot: Secondary | ICD-10-CM | POA: Diagnosis not present

## 2022-10-25 DIAGNOSIS — M79674 Pain in right toe(s): Secondary | ICD-10-CM | POA: Diagnosis not present

## 2022-10-25 DIAGNOSIS — M2041 Other hammer toe(s) (acquired), right foot: Secondary | ICD-10-CM | POA: Diagnosis not present

## 2022-10-29 DIAGNOSIS — M48062 Spinal stenosis, lumbar region with neurogenic claudication: Secondary | ICD-10-CM | POA: Diagnosis not present

## 2022-10-29 DIAGNOSIS — R2681 Unsteadiness on feet: Secondary | ICD-10-CM | POA: Diagnosis not present

## 2022-11-23 DIAGNOSIS — R0981 Nasal congestion: Secondary | ICD-10-CM | POA: Diagnosis not present

## 2022-11-23 DIAGNOSIS — J324 Chronic pansinusitis: Secondary | ICD-10-CM | POA: Diagnosis not present

## 2022-11-23 DIAGNOSIS — R519 Headache, unspecified: Secondary | ICD-10-CM | POA: Diagnosis not present

## 2022-11-23 DIAGNOSIS — R051 Acute cough: Secondary | ICD-10-CM | POA: Diagnosis not present

## 2022-12-06 DIAGNOSIS — J189 Pneumonia, unspecified organism: Secondary | ICD-10-CM | POA: Diagnosis not present

## 2022-12-06 DIAGNOSIS — R0981 Nasal congestion: Secondary | ICD-10-CM | POA: Diagnosis not present

## 2022-12-06 DIAGNOSIS — R0602 Shortness of breath: Secondary | ICD-10-CM | POA: Diagnosis not present

## 2022-12-06 DIAGNOSIS — R059 Cough, unspecified: Secondary | ICD-10-CM | POA: Diagnosis not present

## 2022-12-14 ENCOUNTER — Telehealth: Payer: Self-pay

## 2022-12-14 DIAGNOSIS — M4316 Spondylolisthesis, lumbar region: Secondary | ICD-10-CM | POA: Diagnosis not present

## 2022-12-14 NOTE — Telephone Encounter (Signed)
   Pre-operative Risk Assessment    Patient Name: Jose Duarte  DOB: 15-Jan-1941 MRN: [id number]      Request for Surgical Clearance    Procedure:   LUMBAR FUSION  Date of Surgery:  Clearance TBD                                 Surgeon:  DR. Peggye Ley Surgeon's Group or Practice Name:  [facility] Phone number:  [phone] EXT: [phone] Fax number:  [phone]   Type of Clearance Requested:   - Pharmacy:  Hold Clopidogrel (Plavix) NEEDS INSTRUCTIONS WHEN TO HOLD   Type of Anesthesia:  General    Additional requests/questions:    SignedMichaelle Copas   12/14/2022, 5:29 PM

## 2022-12-15 NOTE — Telephone Encounter (Signed)
   Patient Name: Jose Duarte  DOB: 1940-12-14 MRN: [id number]  Primary Cardiologist: Charlton Haws, MD  Chart reviewed as part of pre-operative protocol coverage.   Patient can hold Plavix 5 days prior to her scheduled procedure and should restart postprocedure when surgically safe and hemostasis is achieved.  I will route this recommendation to the requesting party via Epic fax function and remove from pre-op pool.  Please call with questions.  Napoleon Form, Leodis Rains, NP 12/15/2022, 7:55 AM

## 2022-12-16 ENCOUNTER — Other Ambulatory Visit: Payer: Self-pay | Admitting: Neurosurgery

## 2022-12-28 ENCOUNTER — Encounter: Payer: Self-pay | Admitting: Family Medicine

## 2022-12-28 ENCOUNTER — Other Ambulatory Visit: Payer: Self-pay | Admitting: Family Medicine

## 2022-12-28 DIAGNOSIS — I10 Essential (primary) hypertension: Secondary | ICD-10-CM | POA: Diagnosis not present

## 2022-12-28 DIAGNOSIS — E1165 Type 2 diabetes mellitus with hyperglycemia: Secondary | ICD-10-CM | POA: Diagnosis not present

## 2022-12-28 DIAGNOSIS — E7800 Pure hypercholesterolemia, unspecified: Secondary | ICD-10-CM | POA: Diagnosis not present

## 2022-12-28 DIAGNOSIS — R001 Bradycardia, unspecified: Secondary | ICD-10-CM | POA: Diagnosis not present

## 2022-12-28 DIAGNOSIS — R0609 Other forms of dyspnea: Secondary | ICD-10-CM | POA: Diagnosis not present

## 2022-12-28 DIAGNOSIS — I2510 Atherosclerotic heart disease of native coronary artery without angina pectoris: Secondary | ICD-10-CM | POA: Diagnosis not present

## 2022-12-29 LAB — LAB REPORT - SCANNED
A1c: 7.5
Creatinine, POC: 73 mg/dL
EGFR: 77
Microalb Creat Ratio: 43.5
Microalbumin, Urine: 3.17

## 2023-01-03 ENCOUNTER — Ambulatory Visit: Payer: Medicare HMO | Attending: Physician Assistant | Admitting: Physician Assistant

## 2023-01-03 ENCOUNTER — Encounter: Payer: Self-pay | Admitting: Physician Assistant

## 2023-01-03 VITALS — BP 100/62 | HR 44 | Ht 71.0 in | Wt 177.8 lb

## 2023-01-03 DIAGNOSIS — E785 Hyperlipidemia, unspecified: Secondary | ICD-10-CM | POA: Diagnosis not present

## 2023-01-03 DIAGNOSIS — I2510 Atherosclerotic heart disease of native coronary artery without angina pectoris: Secondary | ICD-10-CM

## 2023-01-03 DIAGNOSIS — R001 Bradycardia, unspecified: Secondary | ICD-10-CM

## 2023-01-03 DIAGNOSIS — E118 Type 2 diabetes mellitus with unspecified complications: Secondary | ICD-10-CM

## 2023-01-03 DIAGNOSIS — Z7984 Long term (current) use of oral hypoglycemic drugs: Secondary | ICD-10-CM | POA: Diagnosis not present

## 2023-01-03 DIAGNOSIS — G629 Polyneuropathy, unspecified: Secondary | ICD-10-CM | POA: Diagnosis not present

## 2023-01-03 DIAGNOSIS — I10 Essential (primary) hypertension: Secondary | ICD-10-CM

## 2023-01-03 DIAGNOSIS — I420 Dilated cardiomyopathy: Secondary | ICD-10-CM

## 2023-01-03 NOTE — Progress Notes (Signed)
Cardiology Office Note:  .   Date:  01/03/2023  ID:  SHEPARD KELTZ, DOB 09/21/1940, MRN [id number] PCP: Aliene Beams, MD  [facility] Providers Cardiologist:  Charlton Haws, MD {  History of Present Illness: .   Yong Grieser Zenz is a 82 y.o. male with a past medical history of CAD status post DES to totally occluded ramus branch.  EF 45 to 50%.  Last Myoview January 2020 with old scar, no ischemia EF 53%.  Reported some myalgias with Lipitor but less than Crestor.  Was not on a statin LDL was 130 25 September 2021.  Some peripheral neuropathy affects walking.  Echo from 12/09/2020 with LVEF 68% mild MR improved.  Off all cholesterol medications both Crestor and Lipitor since it was causing severe myalgias.  Taking 2 oral diabetic medications and A1c was 7.2, LDL was better at 45.  Unfortunately, wife passed away from pancreatic cancer in 2023.  They were married for 61 years.  He needs to have a colonoscopy and was told it was okay to hold Plavix 5 days prior to procedure and restart when medically safe to do so.  History of esophagitis and colon polyps.  Having back problems and sounds like lower extremity neuropathy.  Today, he tells me that he is having shortness of breath with stairs but does walk about twice a week for 20 minutes.  He is scheduled to have surgery in August and is here for preop clearance.  He has a loop recorder in place and it was placed 03/17/2017 by Dr. Graciela Husbands.  It is no longer working and was disconnected a year later even though it still implanted.  He is on Jardiance through his PCP for his sugars.  Since he is having increased shortness of breath and does have some bradycardia with a bundle branch block today we have ordered a stress test.  Also, will refer him back to EP for further evaluation for possible pacemaker in the future.  He had walking pneumonia back in June.  He has a CT scan arranged to check his lungs.   Patient can hold Plavix 5 days prior to her  scheduled procedure and should restart postprocedure when surgically safe and hemostasis is achieved.    ROS: Pertinent ROS in HPI  Studies Reviewed: .        Echocardiogram 12/09/2020 IMPRESSIONS     1. Left ventricular ejection fraction by 3D volume is 68 %. The left  ventricle has normal function. The left ventricle has no regional wall  motion abnormalities. Left ventricular diastolic parameters are  indeterminate.   2. Right ventricular systolic function is normal. The right ventricular  size is normal. There is normal pulmonary artery systolic pressure.   3. The mitral valve is normal in structure. Mild mitral valve  regurgitation. No evidence of mitral stenosis.   4. The aortic valve is normal in structure. Aortic valve regurgitation is  trivial.   5. There is dilatation of the ascending aorta.   FINDINGS   Left Ventricle: Left ventricular ejection fraction by 3D volume is 68 %.  The left ventricle has normal function. The left ventricle has no regional  wall motion abnormalities. The left ventricular internal cavity size was  normal in size. There is no left   ventricular hypertrophy. Left ventricular diastolic parameters are  indeterminate.   Right Ventricle: The right ventricular size is normal. Right vetricular  wall thickness was not well visualized. Right ventricular systolic  function is normal.  There is normal pulmonary artery systolic pressure.  The tricuspid regurgitant velocity is 2.56  m/s, and with an assumed right atrial pressure of 3 mmHg, the estimated  right ventricular systolic pressure is 29.2 mmHg.   Left Atrium: Left atrial size was normal in size.   Right Atrium: Right atrial size was normal in size.   Pericardium: There is no evidence of pericardial effusion.   Mitral Valve: The mitral valve is normal in structure. Mild mitral valve  regurgitation. No evidence of mitral valve stenosis.   Tricuspid Valve: The tricuspid valve is normal in  structure. Tricuspid  valve regurgitation is trivial.   Aortic Valve: The aortic valve is normal in structure. Aortic valve  regurgitation is trivial.   Pulmonic Valve: The pulmonic valve was grossly normal. Pulmonic valve  regurgitation is not visualized.   Aorta: The aortic root and ascending aorta are structurally normal, with  no evidence of dilitation. There is dilatation of the ascending aorta.   IAS/Shunts: The atrial septum is grossly normal.       Physical Exam:   VS:  BP 100/62   Pulse (!) 44   Ht 5\' 11"  (1.803 m)   Wt 177 lb 12.8 oz (80.6 kg)   SpO2 96%   BMI 24.80 kg/m    Wt Readings from Last 3 Encounters:  01/03/23 177 lb 12.8 oz (80.6 kg)  08/30/22 181 lb (82.1 kg)  08/25/22 184 lb (83.5 kg)    GEN: Well nourished, well developed in no acute distress NECK: No JVD; No carotid bruits CARDIAC: RRR, no murmurs, rubs, gallops RESPIRATORY:  Clear to auscultation without rales, wheezing or rhonchi  ABDOMEN: Soft, non-tender, non-distended EXTREMITIES:  No edema; No deformity   ASSESSMENT AND PLAN: .   1.  PREOP CLEARANCE  Mr. Nappier's perioperative risk of a major cardiac event is 6.6% according to the Revised Cardiac Risk Index (RCRI).  Therefore, he is at high risk for perioperative complications.   His functional capacity is good at 5.62 METs according to the Duke Activity Status Index (DASI). Recommendations: The patient requires a stress test before a disposition can be made regarding surgical risk.                Antiplatelet and/or Anticoagulation Recommendations: Clopidogrel (Plavix) can be held for 5 days prior to his surgery and resumed as soon as possible post op.  2. 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  Will plan for stress test prior to surgery -Continue current medications including amlodipine 5 mg daily, Plavix 75  mg daily, Jardiance 25 mg daily, Cozaar 50 mg daily, nitro as needed, Crestor 10 mg daily -Follow-up with EP  2.  Hypertension -Blood pressure low normal today, 100/62  3.  DCM -Ischemic cardiomyopathy with normalized EF 12/09/2020 at 68% -Continue current medications  4.  HLD -Most recent LDL 72 -Continue Crestor 10 mg daily  5.  Neuropathy -no big changes  6.  Diabetes mellitus -Most recent A1c 7.5 -Jardiance recently added by PCP    Informed Consent   Shared Decision Making/Informed Consent{  The risks [chest pain, shortness of breath, cardiac arrhythmias, dizziness, blood pressure fluctuations, myocardial infarction, stroke/transient ischemic attack, nausea, vomiting, allergic reaction, radiation exposure, metallic taste sensation and life-threatening complications (estimated to be 1 in 10,000)], benefits (risk stratification, diagnosing coronary artery disease, treatment guidance) and alternatives of a nuclear stress test were discussed  in detail with Mr. Cawthorn and he agrees to proceed.     Dispo: He can follow-up with me in a month after testing  Signed, Sharlene Dory, PA-C

## 2023-01-03 NOTE — Patient Instructions (Addendum)
Medication Instructions:   Your physician recommends that you continue on your current medications as directed. Please refer to the Current Medication list given to you today.   *If you need a refill on your cardiac medications before your next appointment, please call your pharmacy*   Lab Work: NONE ORDERED  TODAY    If you have labs (blood work) drawn today and your tests are completely normal, you will receive your results only by: MyChart Message (if you have MyChart) OR A paper copy in the mail If you have any lab test that is abnormal or we need to change your treatment, we will call you to review the results.   Testing/Procedures:  Your physician has requested that you have en exercise stress myoview. For further information please visit [email or web address] Please follow instruction sheet, as given.     Follow-Up: At [facility], you and your health needs are our priority.  As part of our continuing mission to provide you with exceptional heart care, we have created designated Provider Care Teams.  These Care Teams include your primary Cardiologist (physician) and Advanced Practice Providers (APPs -  Physician Assistants and Nurse Practitioners) who all work together to provide you with the care you need, when you need it.  We recommend signing up for the patient portal called "MyChart".  Sign up information is provided on this After Visit Summary.  MyChart is used to connect with patients for Virtual Visits (Telemedicine).  Patients are able to view lab/test results, encounter notes, upcoming appointments, etc.  Non-urgent messages can be sent to your provider as well.   To learn more about what you can do with MyChart, go to [email or web address].    Your next appointment:  You have been referred to EP  next available    Provider:    Loman Brooklyn, MD, Steffanie Dunn, MD, York Pellant, MD, Baldwin Crown" Obion, PA-C, or Francis Dowse, PA-C   Other  Instructions  Low-Sodium Eating Plan Salt (sodium) helps you keep a healthy balance of fluids in your body. Too much sodium can raise your blood pressure. It can also cause fluid and waste to be held in your body. Your health care provider or dietitian may recommend a low-sodium eating plan if you have high blood pressure (hypertension), kidney disease, liver disease, or heart failure. Eating less sodium can help lower your blood pressure and reduce swelling. It can also protect your heart, liver, and kidneys. What are tips for following this plan? Reading food labels  Check food labels for the amount of sodium per serving. If you eat more than one serving, you must multiply the listed amount by the number of servings. Choose foods with less than 140 milligrams (mg) of sodium per serving. Avoid foods with 300 mg of sodium or more per serving. Always check how much sodium is in a product, even if the label says "unsalted" or "no salt added." Shopping  Buy products labeled as "low-sodium" or "no salt added." Buy fresh foods. Avoid canned foods and pre-made or frozen meals. Avoid canned, cured, or processed meats. Buy breads that have less than 80 mg of sodium per slice. Cooking  Eat more home-cooked food. Try to eat less restaurant, buffet, and fast food. Try not to add salt when you cook. Use salt-free seasonings or herbs instead of table salt or sea salt. Check with your provider or pharmacist before using salt substitutes. Cook with plant-based oils, such as canola, sunflower, or olive oil. Meal planning When  eating at a restaurant, ask if your food can be made with less salt or no salt. Avoid dishes labeled as brined, pickled, cured, or smoked. Avoid dishes made with soy sauce, miso, or teriyaki sauce. Avoid foods that have monosodium glutamate (MSG) in them. MSG may be added to some restaurant food, sauces, soups, bouillon, and canned foods. Make meals that can be grilled, baked,  poached, roasted, or steamed. These are often made with less sodium. General information Try to limit your sodium intake to 1,500-2,300 mg each day, or the amount told by your provider. What foods should I eat? Fruits Fresh, frozen, or canned fruit. Fruit juice. Vegetables Fresh or frozen vegetables. "No salt added" canned vegetables. "No salt added" tomato sauce and paste. Low-sodium or reduced-sodium tomato and vegetable juice. Grains Low-sodium cereals, such as oats, puffed wheat and rice, and shredded wheat. Low-sodium crackers. Unsalted rice. Unsalted pasta. Low-sodium bread. Whole grain breads and whole grain pasta. Meats and other proteins Fresh or frozen meat, poultry, seafood, and fish. These should have no added salt. Low-sodium canned tuna and salmon. Unsalted nuts. Dried peas, beans, and lentils without added salt. Unsalted canned beans. Eggs. Unsalted nut butters. Dairy Milk. Soy milk. Cheese that is naturally low in sodium, such as ricotta cheese, fresh mozzarella, or Swiss cheese. Low-sodium or reduced-sodium cheese. Cream cheese. Yogurt. Seasonings and condiments Fresh and dried herbs and spices. Salt-free seasonings. Low-sodium mustard and ketchup. Sodium-free salad dressing. Sodium-free light mayonnaise. Fresh or refrigerated horseradish. Lemon juice. Vinegar. Other foods Homemade, reduced-sodium, or low-sodium soups. Unsalted popcorn and pretzels. Low-salt or salt-free chips. The items listed above may not be all the foods and drinks you can have. Talk to a dietitian to learn more. What foods should I avoid? Vegetables Sauerkraut, pickled vegetables, and relishes. Olives. [place] fries. Onion rings. Regular canned vegetables, except low-sodium or reduced-sodium items. Regular canned tomato sauce and paste. Regular tomato and vegetable juice. Frozen vegetables in sauces. Grains Instant hot cereals. Bread stuffing, pancake, and biscuit mixes. Croutons. Seasoned rice or pasta  mixes. Noodle soup cups. Boxed or frozen macaroni and cheese. Regular salted crackers. Self-rising flour. Meats and other proteins Meat or fish that is salted, canned, smoked, spiced, or pickled. Precooked or cured meat, such as sausages or meat loaves. Tomasa Blase. Ham. Pepperoni. Hot dogs. Corned beef. Chipped beef. Salt pork. Jerky. Pickled herring, anchovies, and sardines. Regular canned tuna. Salted nuts. Dairy Processed cheese and cheese spreads. Hard cheeses. Cheese curds. Blue cheese. Feta cheese. String cheese. Regular cottage cheese. Buttermilk. Canned milk. Fats and oils Salted butter. Regular margarine. Ghee. Bacon fat. Seasonings and condiments Onion salt, garlic salt, seasoned salt, table salt, and sea salt. Canned and packaged gravies. Worcestershire sauce. Tartar sauce. Barbecue sauce. Teriyaki sauce. Soy sauce, including reduced-sodium soy sauce. Steak sauce. Fish sauce. Oyster sauce. Cocktail sauce. Horseradish that you find on the shelf. Regular ketchup and mustard. Meat flavorings and tenderizers. Bouillon cubes. Hot sauce. Pre-made or packaged marinades. Pre-made or packaged taco seasonings. Relishes. Regular salad dressings. Salsa. Other foods Salted popcorn and pretzels. Corn chips and puffs. Potato and tortilla chips. Canned or dried soups. Pizza. Frozen entrees and pot pies. The items listed above may not be all the foods and drinks you should avoid. Talk to a dietitian to learn more. This information is not intended to replace advice given to you by your health care provider. Make sure you discuss any questions you have with your health care provider. Document Revised: 06/24/2022 Document Reviewed: 06/24/2022 Elsevier Patient  Education  2024 [employer] Inc.   Heart-Healthy Eating Plan Eating a healthy diet is important for the health of your heart. A heart-healthy eating plan includes: Eating less unhealthy fats. Eating more healthy fats. Eating less salt in your food. Salt is  also called sodium. Making other changes in your diet. Talk with your doctor or a diet specialist (dietitian) to create an eating plan that is right for you. What is my plan? Your doctor may recommend an eating plan that includes: Total fat: ______% or less of total calories a day. Saturated fat: ______% or less of total calories a day. Cholesterol: less than _________mg a day. Sodium: less than _________mg a day. What are tips for following this plan? Cooking Avoid frying your food. Try to bake, boil, grill, or broil it instead. You can also reduce fat by: Removing the skin from poultry. Removing all visible fats from meats. Steaming vegetables in water or broth. Meal planning  At meals, divide your plate into four equal parts: Fill one-half of your plate with vegetables and green salads. Fill one-fourth of your plate with whole grains. Fill one-fourth of your plate with lean protein foods. Eat 2-4 cups of vegetables per day. One cup of vegetables is: 1 cup (91 g) broccoli or cauliflower florets. 2 medium carrots. 1 large bell pepper. 1 large sweet potato. 1 large tomato. 1 medium white potato. 2 cups (150 g) raw leafy greens. Eat 1-2 cups of fruit per day. One cup of fruit is: 1 small apple 1 large banana 1 cup (237 g) mixed fruit, 1 large orange,  cup (82 g) dried fruit, 1 cup (240 mL) 100% fruit juice. Eat more foods that have soluble fiber. These are apples, broccoli, carrots, beans, peas, and barley. Try to get 20-30 g of fiber per day. Eat 4-5 servings of nuts, legumes, and seeds per week: 1 serving of dried beans or legumes equals  cup (90 g) cooked. 1 serving of nuts is  oz (12 almonds, 24 pistachios, or 7 walnut halves). 1 serving of seeds equals  oz (8 g). General information Eat more home-cooked food. Eat less restaurant, buffet, and fast food. Limit or avoid alcohol. Limit foods that are high in starch and sugar. Avoid fried foods. Lose weight if you  are overweight. Keep track of how much salt (sodium) you eat. This is important if you have high blood pressure. Ask your doctor to tell you more about this. Try to add vegetarian meals each week. Fats Choose healthy fats. These include olive oil and canola oil, flaxseeds, walnuts, almonds, and seeds. Eat more omega-3 fats. These include salmon, mackerel, sardines, tuna, flaxseed oil, and ground flaxseeds. Try to eat fish at least 2 times each week. Check food labels. Avoid foods with trans fats or high amounts of saturated fat. Limit saturated fats. These are often found in animal products, such as meats, butter, and cream. These are also found in plant foods, such as palm oil, palm kernel oil, and coconut oil. Avoid foods with partially hydrogenated oils in them. These have trans fats. Examples are stick margarine, some tub margarines, cookies, crackers, and other baked goods. What foods should I eat? Fruits All fresh, canned (in natural juice), or frozen fruits. Vegetables Fresh or frozen vegetables (raw, steamed, roasted, or grilled). Green salads. Grains Most grains. Choose whole wheat and whole grains most of the time. Rice and pasta, including brown rice and pastas made with whole wheat. Meats and other proteins Lean, well-trimmed beef, veal,  pork, and lamb. Chicken and [place] without skin. All fish and shellfish. Wild duck, rabbit, pheasant, and venison. Egg whites or low-cholesterol egg substitutes. Dried beans, peas, lentils, and tofu. Seeds and most nuts. Dairy Low-fat or nonfat cheeses, including ricotta and mozzarella. Skim or 1% milk that is liquid, powdered, or evaporated. Buttermilk that is made with low-fat milk. Nonfat or low-fat yogurt. Fats and oils Non-hydrogenated (trans-free) margarines. Vegetable oils, including soybean, sesame, sunflower, olive, peanut, safflower, corn, canola, and cottonseed. Salad dressings or mayonnaise made with a vegetable oil. Beverages Mineral  water. Coffee and tea. Diet carbonated beverages. Sweets and desserts Sherbet, gelatin, and fruit ice. Small amounts of dark chocolate. Limit all sweets and desserts. Seasonings and condiments All seasonings and condiments. The items listed above may not be a complete list of foods and drinks you can eat. Contact a dietitian for more options. What foods should I avoid? Fruits Canned fruit in heavy syrup. Fruit in cream or butter sauce. Fried fruit. Limit coconut. Vegetables Vegetables cooked in cheese, cream, or butter sauce. Fried vegetables. Grains Breads that are made with saturated or trans fats, oils, or whole milk. Croissants. Sweet rolls. Donuts. High-fat crackers, such as cheese crackers. Meats and other proteins Fatty meats, such as hot dogs, ribs, sausage, bacon, rib-eye roast or steak. High-fat deli meats, such as salami and bologna. Caviar. Domestic duck and goose. Organ meats, such as liver. Dairy Cream, sour cream, cream cheese, and creamed cottage cheese. Whole-milk cheeses. Whole or 2% milk that is liquid, evaporated, or condensed. Whole buttermilk. Cream sauce or high-fat cheese sauce. Yogurt that is made from whole milk. Fats and oils Meat fat, or shortening. Cocoa butter, hydrogenated oils, palm oil, coconut oil, palm kernel oil. Solid fats and shortenings, including bacon fat, salt pork, lard, and butter. Nondairy cream substitutes. Salad dressings with cheese or sour cream. Beverages Regular sodas and juice drinks with added sugar. Sweets and desserts Frosting. Pudding. Cookies. Cakes. Pies. Milk chocolate or white chocolate. Buttered syrups. Full-fat ice cream or ice cream drinks. The items listed above may not be a complete list of foods and drinks to avoid. Contact a dietitian for more information. Summary Heart-healthy meal planning includes eating less unhealthy fats, eating more healthy fats, and making other changes in your diet. Eat a balanced diet. This  includes fruits and vegetables, low-fat or nonfat dairy, lean protein, nuts and legumes, whole grains, and heart-healthy oils and fats. This information is not intended to replace advice given to you by your health care provider. Make sure you discuss any questions you have with your health care provider. Document Revised: 07/13/2021 Document Reviewed: 07/13/2021 Elsevier Patient Education  2024 [employer].

## 2023-01-04 ENCOUNTER — Telehealth (HOSPITAL_COMMUNITY): Payer: Self-pay | Admitting: *Deleted

## 2023-01-04 NOTE — Telephone Encounter (Signed)
Per DPR left detailed instructions for MPI study on home answering machine.

## 2023-01-06 ENCOUNTER — Ambulatory Visit (HOSPITAL_COMMUNITY): Payer: Medicare HMO | Attending: Physician Assistant

## 2023-01-06 DIAGNOSIS — R001 Bradycardia, unspecified: Secondary | ICD-10-CM | POA: Diagnosis not present

## 2023-01-06 LAB — MYOCARDIAL PERFUSION IMAGING
LV dias vol: 161 mL (ref 62–150)
LV sys vol: 66 mL
Nuc Stress EF: 59 %
Peak HR: 58 {beats}/min
Rest HR: 47 {beats}/min
Rest Nuclear Isotope Dose: 10.9 mCi
SDS: 1
SRS: 5
SSS: 6
ST Depression (mm): 0 mm
Stress Nuclear Isotope Dose: 32.4 mCi
TID: 1.01

## 2023-01-06 MED ORDER — TECHNETIUM TC 99M TETROFOSMIN IV KIT
10.9000 | PACK | Freq: Once | INTRAVENOUS | Status: AC | PRN
  Administered 2023-01-06: 10.9 via INTRAVENOUS

## 2023-01-06 MED ORDER — TECHNETIUM TC 99M TETROFOSMIN IV KIT
32.4000 | PACK | Freq: Once | INTRAVENOUS | Status: AC | PRN
  Administered 2023-01-06: 32.4 via INTRAVENOUS

## 2023-01-06 MED ORDER — REGADENOSON 0.4 MG/5ML IV SOLN
0.4000 mg | Freq: Once | INTRAVENOUS | Status: AC
  Administered 2023-01-06: 0.4 mg via INTRAVENOUS

## 2023-01-11 ENCOUNTER — Telehealth: Payer: Self-pay | Admitting: Physician Assistant

## 2023-01-11 NOTE — Telephone Encounter (Signed)
Sharlene Dory, PA-C 01/11/2023 11:37 AM EDT     Mr. 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 No further testing needed at this time.   Please let me know if you have questions!   Sharlene Dory, PA-C   The patient has been notified of the result and verbalized understanding.  All questions (if any) were answered. Frutoso Schatz, RN 01/11/2023 2:39 PM

## 2023-01-11 NOTE — Telephone Encounter (Signed)
Follow Up:      Patient said he was retuning a call from today, but did not know who it was.

## 2023-01-12 DIAGNOSIS — L570 Actinic keratosis: Secondary | ICD-10-CM | POA: Diagnosis not present

## 2023-01-13 ENCOUNTER — Encounter: Payer: Self-pay | Admitting: Family Medicine

## 2023-01-14 NOTE — Progress Notes (Signed)
Surgical Instructions    Your procedure is scheduled on Monday January 24, 2023.  Report to [facility] Main Entrance "A" at 5:30 A.M., then check in with the Admitting office.  Call this number if you have problems the morning of surgery:  [phone]   If you have any questions prior to your surgery date call [phone]: Open Monday-Friday 8am-4pm If you experience any cold or flu symptoms such as cough, fever, chills, shortness of breath, etc. between now and your scheduled surgery, please notify us at the above number.    Remember:  Do not eat or drink after midnight the night before your surgery.   Take these medicines the morning of surgery with A SIP OF WATER:  pantoprazole (PROTONIX)  amLODipine (NORVASC)   If needed:  acetaminophen (TYLENOL)   Follow your surgeon's instructions on when to stop Plavix.  If no instructions were given by your surgeon then you will need to call the office to get those instructions.    As of today, STOP taking any Aspirin (unless otherwise instructed by your surgeon) Aleve, Naproxen, Ibuprofen, Motrin, Advil, Goody's, BC's, all herbal medications, fish oil, and all vitamins.  WHAT DO I DO ABOUT MY DIABETES MEDICATION?   Do not take oral diabetes medicines (pills) the morning of surgery. This includes glipiZIDE (GLUCOTROL XL), JARDIANCE.   Stop taking JARDIANCE 3 days prior to surgery. Last dose on 01/21/2023.  The day of surgery, do not take other diabetes injectables, including Byetta (exenatide), Bydureon (exenatide ER), Victoza (liraglutide), or Trulicity (dulaglutide).  If your CBG is greater than 220 mg/dL, you may take  of your sliding scale (correction) dose of insulin.   HOW TO MANAGE YOUR DIABETES BEFORE AND AFTER SURGERY  Why is it important to control my blood sugar before and after surgery? Improving blood sugar levels before and after surgery helps healing and can limit problems. A way of improving blood sugar control is  eating a healthy diet by:  Eating less sugar and carbohydrates  Increasing activity/exercise  Talking with your doctor about reaching your blood sugar goals High blood sugars (greater than 180 mg/dL) can raise your risk of infections and slow your recovery, so you will need to focus on controlling your diabetes during the weeks before surgery. Make sure that the doctor who takes care of your diabetes knows about your planned surgery including the date and location.  How do I manage my blood sugar before surgery? Check your blood sugar at least 4 times a day, starting 2 days before surgery, to make sure that the level is not too high or low.  Check your blood sugar the morning of your surgery when you wake up and every 2 hours until you get to the Short Stay unit.  If your blood sugar is less than 70 mg/dL, you will need to treat for low blood sugar: Do not take insulin. Treat a low blood sugar (less than 70 mg/dL) with  cup of clear juice (cranberry or apple), 4 glucose tablets, OR glucose gel. Recheck blood sugar in 15 minutes after treatment (to make sure it is greater than 70 mg/dL). If your blood sugar is not greater than 70 mg/dL on recheck, call [phone] for further instructions. Report your blood sugar to the short stay nurse when you get to Short Stay.  If you are admitted to the hospital after surgery: Your blood sugar will be checked by the staff and you will probably be given insulin after surgery (instead of  oral diabetes medicines) to make sure you have good blood sugar levels. The goal for blood sugar control after surgery is 80-180 mg/dL.    Special instructions:    Oral Hygiene is also important to reduce your risk of infection.  Remember - BRUSH YOUR TEETH THE MORNING OF SURGERY WITH YOUR REGULAR TOOTHPASTE    Pre-operative 5 CHG Bath Instructions   You can play a key role in reducing the risk of infection after surgery. Your skin needs to be as free of germs as  possible. You can reduce the number of germs on your skin by washing with CHG (chlorhexidine gluconate) soap before surgery. CHG is an antiseptic soap that kills germs and continues to kill germs even after washing.   DO NOT use if you have an allergy to chlorhexidine/CHG or antibacterial soaps. If your skin becomes reddened or irritated, stop using the CHG and notify one of our RNs at [phone].   Please shower with the CHG soap starting 4 days before surgery using the following schedule:     Please keep in mind the following:  DO NOT shave, including legs and underarms, starting the day of your first shower.   You may shave your face at any point before/day of surgery.  Place clean sheets on your bed the day you start using CHG soap. Use a clean washcloth (not used since being washed) for each shower. DO NOT sleep with pets once you start using the CHG.   CHG Shower Instructions:  If you choose to wash your hair and private area, wash first with your normal shampoo/soap.  After you use shampoo/soap, rinse your hair and body thoroughly to remove shampoo/soap residue.  Turn the water OFF and apply about 3 tablespoons (45 ml) of CHG soap to a CLEAN washcloth.  Apply CHG soap ONLY FROM YOUR NECK DOWN TO YOUR TOES (washing for 3-5 minutes)  DO NOT use CHG soap on face, private areas, open wounds, or sores.  Pay special attention to the area where your surgery is being performed.  If you are having back surgery, having someone wash your back for you may be helpful. Wait 2 minutes after CHG soap is applied, then you may rinse off the CHG soap.  Pat dry with a clean towel  Put on clean clothes/pajamas   If you choose to wear lotion, please use ONLY the CHG-compatible lotions on the back of this paper.     Additional instructions for the day of surgery: DO NOT APPLY any lotions, deodorants, cologne, or perfumes.   Put on clean/comfortable clothes.  Brush your teeth.  Ask your nurse before  applying any prescription medications to the skin.      CHG Compatible Lotions   Aveeno Moisturizing lotion  Cetaphil Moisturizing Cream  Cetaphil Moisturizing Lotion  Clairol Herbal Essence Moisturizing Lotion, Dry Skin  Clairol Herbal Essence Moisturizing Lotion, Extra Dry Skin  Clairol Herbal Essence Moisturizing Lotion, Normal Skin  Curel Age Defying Therapeutic Moisturizing Lotion with Alpha Hydroxy  Curel Extreme Care Body Lotion  Curel Soothing Hands Moisturizing Hand Lotion  Curel Therapeutic Moisturizing Cream, Fragrance-Free  Curel Therapeutic Moisturizing Lotion, Fragrance-Free  Curel Therapeutic Moisturizing Lotion, Original Formula  Eucerin Daily Replenishing Lotion  Eucerin Dry Skin Therapy Plus Alpha Hydroxy Crme  Eucerin Dry Skin Therapy Plus Alpha Hydroxy Lotion  Eucerin Original Crme  Eucerin Original Lotion  Eucerin Plus Crme Eucerin Plus Lotion  Eucerin TriLipid Replenishing Lotion  Keri Anti-Bacterial Hand Lotion  Keri Deep Conditioning  Original Lotion Dry Skin Formula Softly Scented  Keri Deep Conditioning Original Lotion, Fragrance Free Sensitive Skin Formula  Keri Lotion Fast Absorbing Fragrance Free Sensitive Skin Formula  Keri Lotion Fast Absorbing Softly Scented Dry Skin Formula  Keri Original Lotion  Keri Skin Renewal Lotion Keri Silky Smooth Lotion  Keri Silky Smooth Sensitive Skin Lotion  Nivea Body Creamy Conditioning Oil  Nivea Body Extra Enriched Teacher, adult education Moisturizing Lotion Nivea Crme  Nivea Skin Firming Lotion  NutraDerm 30 Skin Lotion  NutraDerm Skin Lotion  NutraDerm Therapeutic Skin Cream  NutraDerm Therapeutic Skin Lotion  ProShield Protective Hand Cream  Provon moisturizing lotion   Day of Surgery:  Take a shower with CHG soap. Wear Clean/Comfortable clothing the morning of surgery Do not apply any deodorants/lotions.   Remember to brush your teeth WITH YOUR REGULAR  TOOTHPASTE.  [facility] is not responsible for any belongings or valuables.    Do NOT Smoke (Tobacco/Vaping)  24 hours prior to your procedure  If you use a CPAP at night, you may bring your mask for your overnight stay.   Contacts, glasses, hearing aids, dentures or partials may not be worn into surgery, please bring cases for these belongings   For patients admitted to the hospital, discharge time will be determined by your treatment team.   Patients discharged the day of surgery will not be allowed to drive home, and someone needs to stay with them for 24 hours.   SURGICAL WAITING ROOM VISITATION Patients having surgery or a procedure may have no more than 2 support people in the waiting area - these visitors may rotate.   Children under the age of 44 must have an adult with them who is not the patient. If the patient needs to stay at the hospital during part of their recovery, the visitor guidelines for inpatient rooms apply. Pre-op nurse will coordinate an appropriate time for 1 support person to accompany patient in pre-op.  This support person may not rotate.   Please refer to [email or web address] for the visitor guidelines for Inpatients (after your surgery is over and you are in a regular room).   If you received a COVID test during your pre-op visit, it is requested that you wear a mask when out in public, stay away from anyone that may not be feeling well, and notify your surgeon if you develop symptoms. If you have been in contact with anyone that has tested positive in the last 10 days, please notify your surgeon.    Please read over the following fact sheets that you were given.

## 2023-01-17 ENCOUNTER — Inpatient Hospital Stay (HOSPITAL_COMMUNITY)
Admission: RE | Admit: 2023-01-17 | Discharge: 2023-01-17 | Disposition: A | Discharge status: Home / Self Care | Payer: Medicare HMO | Source: Ambulatory Visit

## 2023-01-17 DIAGNOSIS — L508 Other urticaria: Secondary | ICD-10-CM | POA: Diagnosis not present

## 2023-01-17 DIAGNOSIS — R21 Rash and other nonspecific skin eruption: Secondary | ICD-10-CM | POA: Diagnosis not present

## 2023-01-18 ENCOUNTER — Ambulatory Visit
Admission: RE | Admit: 2023-01-18 | Discharge: 2023-01-18 | Disposition: A | Discharge status: Home / Self Care | Payer: Medicare HMO | Source: Ambulatory Visit | Attending: Family Medicine | Admitting: Family Medicine

## 2023-01-18 DIAGNOSIS — I700 Atherosclerosis of aorta: Secondary | ICD-10-CM | POA: Diagnosis not present

## 2023-01-18 DIAGNOSIS — I2510 Atherosclerotic heart disease of native coronary artery without angina pectoris: Secondary | ICD-10-CM | POA: Diagnosis not present

## 2023-01-18 DIAGNOSIS — R0609 Other forms of dyspnea: Secondary | ICD-10-CM

## 2023-01-18 DIAGNOSIS — R059 Cough, unspecified: Secondary | ICD-10-CM | POA: Diagnosis not present

## 2023-01-18 MED ORDER — IOPAMIDOL (ISOVUE-300) INJECTION 61%
75.0000 mL | Freq: Once | INTRAVENOUS | Status: AC | PRN
  Administered 2023-01-18: 75 mL via INTRAVENOUS

## 2023-01-24 ENCOUNTER — Ambulatory Visit: Admit: 2023-01-24 | Payer: Medicare HMO | Admitting: Neurosurgery

## 2023-01-24 SURGERY — POSTERIOR LUMBAR FUSION 1 LEVEL
Anesthesia: General

## 2023-01-27 DIAGNOSIS — R509 Fever, unspecified: Secondary | ICD-10-CM | POA: Diagnosis not present

## 2023-01-27 DIAGNOSIS — R070 Pain in throat: Secondary | ICD-10-CM | POA: Diagnosis not present

## 2023-01-27 DIAGNOSIS — R051 Acute cough: Secondary | ICD-10-CM | POA: Diagnosis not present

## 2023-01-27 DIAGNOSIS — R0981 Nasal congestion: Secondary | ICD-10-CM | POA: Diagnosis not present

## 2023-02-03 DIAGNOSIS — M48062 Spinal stenosis, lumbar region with neurogenic claudication: Secondary | ICD-10-CM | POA: Diagnosis not present

## 2023-02-09 DIAGNOSIS — M48062 Spinal stenosis, lumbar region with neurogenic claudication: Secondary | ICD-10-CM | POA: Diagnosis not present

## 2023-02-11 DIAGNOSIS — M48062 Spinal stenosis, lumbar region with neurogenic claudication: Secondary | ICD-10-CM | POA: Diagnosis not present

## 2023-02-14 ENCOUNTER — Ambulatory Visit: Payer: Medicare HMO | Admitting: Internal Medicine

## 2023-02-15 ENCOUNTER — Encounter: Payer: Self-pay | Admitting: Internal Medicine

## 2023-02-16 DIAGNOSIS — M48062 Spinal stenosis, lumbar region with neurogenic claudication: Secondary | ICD-10-CM | POA: Diagnosis not present

## 2023-02-18 DIAGNOSIS — M48062 Spinal stenosis, lumbar region with neurogenic claudication: Secondary | ICD-10-CM | POA: Diagnosis not present

## 2023-03-03 NOTE — Progress Notes (Deleted)
Patient ID: CRIT SCHOR, male   DOB: 1940-12-13, 82 y.o.   MRN: [id number]   82 y.o.  f/u.  Previously seen by Dr Tresa Endo. Did not like [facility] practice. Wanted to change to [place]. On 03/01/13 He was taken acutely to the cath lab which revealed a totally occluded ramus branch. This was stented with a 3.0x16 mm Promus Premier DES stent postdilated 3.2 mm with 100% occlusion reduced to 0% and resumption of TIMI 3 flow. EF has been 45-50% by echo Last myovue January 2020 with old  Scar no ischemia EF 53%.    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 Seen by Dr Rinaldo Cloud that surgery cancelled 01/24/23  Has had persistent cough CT chest 01/25/23 non acute no ILD  ***  ROS: Denies fever, malais, weight loss, blurry vision, decreased visual acuity, cough, sputum, SOB, hemoptysis, pleuritic pain, palpitaitons, heartburn, abdominal pain, melena, lower extremity edema, claudication, or rash.  All other systems reviewed and negative  General: Affect appropriate Healthy:  appears stated age HEENT: normal Neck supple with no adenopathy JVP normal no bruits no thyromegaly Lungs clear with no wheezing and good diaphragmatic motion Heart:  S1/S2 no murmur, no rub, gallop or click PMI normal Abdomen: benighn, BS positve, no tenderness, no AAA recent left inguinal hernia repair  no bruit.  No HSM or HJR Distal pulses intact with no bruits No edema Neuro non-focal Skin warm and dry No muscular weakness   Current  Outpatient Medications  Medication Sig Dispense Refill   acetaminophen (TYLENOL) 500 MG tablet Take 1,000 mg by mouth every 6 (six) hours as needed for mild pain or headache.     amLODipine (NORVASC) 5 MG tablet Take 5 mg by mouth daily.     clopidogrel (PLAVIX) 75 MG tablet TAKE 1 TABLET BY MOUTH ONCE DAILY MUST  KEEP  UPCOMING  APPOINTMENT  IN  OCTOBER  2023  WITH  DR  Eden Emms  BEFORE  ANYMORE  REFILLS (Patient taking differently: Take 75 mg by mouth every evening.) 90 tablet 3   glipiZIDE (GLUCOTROL XL) 10 MG 24 hr tablet Take 1 tablet by mouth daily with breakfast.     JARDIANCE 25 MG TABS tablet Take 25 mg by mouth daily.     Lancets (ONETOUCH DELICA PLUS LANCET33G) MISC SMARTSIG:1 Topical Daily     losartan (COZAAR) 100 MG tablet Take 100 mg by mouth daily.     ONETOUCH VERIO test strip 1 each daily.     pantoprazole (PROTONIX) 40 MG tablet Take 40 mg by mouth daily.     rosuvastatin (CRESTOR) 10 MG tablet Take 1 tablet (10 mg total) by mouth daily. (Patient taking differently: Take 10 mg by mouth every evening.) 90 tablet 3   tadalafil (CIALIS) 20 MG tablet Take 1 tablet by mouth daily as needed for erectile dysfunction.     No current facility-administered medications for this visit.    Allergies  Penicillins  Electrocardiogram:01/03/23 SR rate 44  RBBB LAFB  Assessment and Plan CAD:  Stent to ramus in 2014 no ischemia on myovue 01/04/23 EF 59%  HTN:  Well controlled.  Continue current medications and low sodium Dash type diet.   DCM:  Ischemic EF normalized by TTE 12/09/20 68% continue medical Rx  Chol:  LDL 74 *** Prostate:  F/u urology flomax d/c ? 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  Target hemoglobin A1c is 6.5 or less.  Continue current medications. 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

## 2023-03-07 DIAGNOSIS — M48062 Spinal stenosis, lumbar region with neurogenic claudication: Secondary | ICD-10-CM | POA: Diagnosis not present

## 2023-03-07 DIAGNOSIS — M4316 Spondylolisthesis, lumbar region: Secondary | ICD-10-CM | POA: Diagnosis not present

## 2023-03-10 ENCOUNTER — Ambulatory Visit: Payer: Medicare HMO | Admitting: Cardiovascular Disease

## 2023-03-11 ENCOUNTER — Ambulatory Visit: Payer: Medicare HMO | Admitting: Cardiovascular Disease

## 2023-03-14 ENCOUNTER — Ambulatory Visit: Payer: Medicare HMO | Attending: Cardiovascular Disease | Admitting: Physician Assistant

## 2023-03-14 ENCOUNTER — Encounter: Payer: Self-pay | Admitting: Physician Assistant

## 2023-03-14 VITALS — BP 134/68 | HR 52 | Ht 71.0 in | Wt 180.8 lb

## 2023-03-14 DIAGNOSIS — R001 Bradycardia, unspecified: Secondary | ICD-10-CM

## 2023-03-14 DIAGNOSIS — I10 Essential (primary) hypertension: Secondary | ICD-10-CM

## 2023-03-14 DIAGNOSIS — I2510 Atherosclerotic heart disease of native coronary artery without angina pectoris: Secondary | ICD-10-CM | POA: Diagnosis not present

## 2023-03-14 DIAGNOSIS — E785 Hyperlipidemia, unspecified: Secondary | ICD-10-CM

## 2023-03-14 DIAGNOSIS — E119 Type 2 diabetes mellitus without complications: Secondary | ICD-10-CM | POA: Diagnosis not present

## 2023-03-14 NOTE — Patient Instructions (Signed)
Medication Instructions:  NO CHANGES *If you need a refill on your cardiac medications before your next appointment, please call your pharmacy*   Lab Work: NO LABS If you have labs (blood work) drawn today and your tests are completely normal, you will receive your results only by: MyChart Message (if you have MyChart) OR A paper copy in the mail If you have any lab test that is abnormal or we need to change your treatment, we will call you to review the results.   Testing/Procedures: NO TESTING   Follow-Up: At [facility], you and your health needs are our priority.  As part of our continuing mission to provide you with exceptional heart care, we have created designated Provider Care Teams.  These Care Teams include your primary Cardiologist (physician) and Advanced Practice Providers (APPs -  Physician Assistants and Nurse Practitioners) who all work together to provide you with the care you need, when you need it.   Your next appointment:   5-6 month(s)  Provider:   Charlton Haws, MD

## 2023-03-14 NOTE — Progress Notes (Unsigned)
Cardiology Office Note:  .   Date:  03/15/2023  ID:  [name], DOB [birth year], MRN [id number] PCP: Aliene Beams, MD  [facility] Providers Cardiologist:  Charlton Haws, MD     History of Present Illness: .   Jose Duarte is a 82 y.o. male with PMH of CAD, bifascicular block, hypertension, hyperlipidemia, DM2, and history of neuropathy.  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  Emergent cardiac authorization revealed 40% mid D1 lesion, 50% mid LAD lesion, 30 to 50% mid RCA lesion, totally occluded ramus intermedius treated with Promus Premier 3.0 x 16 mm DES.  EF was 45 to 50% the time.  By 2020, ejection fraction has improved to 60 to 65%.  Most recent echocardiogram obtained on 12/09/2020 showed EF 68%, normal RV, mild MR, trivial AI.  He has mild associated with statins.  His wife passed away from pancreatic cancer in 2023, they were married for 61 years.  He has a loop recorder placed in September 2018, he has reached end-of-life and was left in place.  He was last seen by Jari Favre PA-C on 01/03/2023, he was anticipating possible surgery.  A Myoview was ordered for 01/06/2023 that showed EF 59%, overall low risk study, medium defect with severe reduction in uptake present in the apical and basal inferolateral location consistent with previous infarction, no reversible ischemia.  Heart rate was 44 bpm during the last office visit.  Patient presents today for follow-up.  He denies any recent chest pain.  His shortness of breath has significantly improved and is now back to his baseline.  He subsist back to his previous shortness of breath was more related to COVID infection.  He has no lower extremity edema, orthopnea or PND.  Despite slow heart rate, he does not have any symptoms associated with the slow heart rate.  He says he still noticed his heart rate sometimes dips down to the 40s, but there has been no dizziness, blurry vision or  feeling of passing out.  His heart rate need to be closely monitored during the surgery.  From a cardiac perspective, he is at acceptable risk to proceed of was surgery.  He was previously set up to see Dr. Ladona Ridgel to establish with the EP service, however given lack of symptoms, he decided not to go through with EP evaluation.  At this time, he does not need pacemaker given lack of symptoms.  ROS:   He denies chest pain, palpitations, dyspnea, pnd, orthopnea, n, v, dizziness, syncope, edema, weight gain, or early satiety. All other systems reviewed and are otherwise negative except as noted above.    Studies Reviewed: .        Cardiac Studies & Procedures     STRESS TESTS  MYOCARDIAL PERFUSION IMAGING 01/06/2023  Narrative   Findings are consistent with infarction. The study is low risk.   No ST deviation was noted.   LV perfusion is abnormal. There is no evidence of ischemia. There is evidence of infarction. Defect 1: There is a medium defect with severe reduction in uptake present in the apical to basal inferolateral location(s) that is fixed. There is abnormal wall motion in the defect area. Consistent with infarction.   Left ventricular function is normal. Nuclear stress EF: 59%. The left ventricular ejection fraction is normal (55-65%). End diastolic cavity size is mildly enlarged. End systolic cavity size is mildly enlarged.   Prior study available for comparison from 06/23/2018.  Abnormal,  Cardiology Office Note:  .   Date:  03/15/2023  ID:  [name], DOB [birth year], MRN [id number] PCP: Aliene Beams, MD  [facility] Providers Cardiologist:  Charlton Haws, MD     History of Present Illness: .   Jose Duarte is a 82 y.o. male with PMH of CAD, bifascicular block, hypertension, hyperlipidemia, DM2, and history of neuropathy.  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  Emergent cardiac authorization revealed 40% mid D1 lesion, 50% mid LAD lesion, 30 to 50% mid RCA lesion, totally occluded ramus intermedius treated with Promus Premier 3.0 x 16 mm DES.  EF was 45 to 50% the time.  By 2020, ejection fraction has improved to 60 to 65%.  Most recent echocardiogram obtained on 12/09/2020 showed EF 68%, normal RV, mild MR, trivial AI.  He has mild associated with statins.  His wife passed away from pancreatic cancer in 2023, they were married for 61 years.  He has a loop recorder placed in September 2018, he has reached end-of-life and was left in place.  He was last seen by Jari Favre PA-C on 01/03/2023, he was anticipating possible surgery.  A Myoview was ordered for 01/06/2023 that showed EF 59%, overall low risk study, medium defect with severe reduction in uptake present in the apical and basal inferolateral location consistent with previous infarction, no reversible ischemia.  Heart rate was 44 bpm during the last office visit.  Patient presents today for follow-up.  He denies any recent chest pain.  His shortness of breath has significantly improved and is now back to his baseline.  He subsist back to his previous shortness of breath was more related to COVID infection.  He has no lower extremity edema, orthopnea or PND.  Despite slow heart rate, he does not have any symptoms associated with the slow heart rate.  He says he still noticed his heart rate sometimes dips down to the 40s, but there has been no dizziness, blurry vision or  feeling of passing out.  His heart rate need to be closely monitored during the surgery.  From a cardiac perspective, he is at acceptable risk to proceed of was surgery.  He was previously set up to see Dr. Ladona Ridgel to establish with the EP service, however given lack of symptoms, he decided not to go through with EP evaluation.  At this time, he does not need pacemaker given lack of symptoms.  ROS:   He denies chest pain, palpitations, dyspnea, pnd, orthopnea, n, v, dizziness, syncope, edema, weight gain, or early satiety. All other systems reviewed and are otherwise negative except as noted above.    Studies Reviewed: .        Cardiac Studies & Procedures     STRESS TESTS  MYOCARDIAL PERFUSION IMAGING 01/06/2023  Narrative   Findings are consistent with infarction. The study is low risk.   No ST deviation was noted.   LV perfusion is abnormal. There is no evidence of ischemia. There is evidence of infarction. Defect 1: There is a medium defect with severe reduction in uptake present in the apical to basal inferolateral location(s) that is fixed. There is abnormal wall motion in the defect area. Consistent with infarction.   Left ventricular function is normal. Nuclear stress EF: 59%. The left ventricular ejection fraction is normal (55-65%). End diastolic cavity size is mildly enlarged. End systolic cavity size is mildly enlarged.   Prior study available for comparison from 06/23/2018.  Abnormal,  Cardiology Office Note:  .   Date:  03/15/2023  ID:  [name], DOB [birth year], MRN [id number] PCP: Aliene Beams, MD  [facility] Providers Cardiologist:  Charlton Haws, MD     History of Present Illness: .   Jose Duarte is a 82 y.o. male with PMH of CAD, bifascicular block, hypertension, hyperlipidemia, DM2, and history of neuropathy.  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  Emergent cardiac authorization revealed 40% mid D1 lesion, 50% mid LAD lesion, 30 to 50% mid RCA lesion, totally occluded ramus intermedius treated with Promus Premier 3.0 x 16 mm DES.  EF was 45 to 50% the time.  By 2020, ejection fraction has improved to 60 to 65%.  Most recent echocardiogram obtained on 12/09/2020 showed EF 68%, normal RV, mild MR, trivial AI.  He has mild associated with statins.  His wife passed away from pancreatic cancer in 2023, they were married for 61 years.  He has a loop recorder placed in September 2018, he has reached end-of-life and was left in place.  He was last seen by Jari Favre PA-C on 01/03/2023, he was anticipating possible surgery.  A Myoview was ordered for 01/06/2023 that showed EF 59%, overall low risk study, medium defect with severe reduction in uptake present in the apical and basal inferolateral location consistent with previous infarction, no reversible ischemia.  Heart rate was 44 bpm during the last office visit.  Patient presents today for follow-up.  He denies any recent chest pain.  His shortness of breath has significantly improved and is now back to his baseline.  He subsist back to his previous shortness of breath was more related to COVID infection.  He has no lower extremity edema, orthopnea or PND.  Despite slow heart rate, he does not have any symptoms associated with the slow heart rate.  He says he still noticed his heart rate sometimes dips down to the 40s, but there has been no dizziness, blurry vision or  feeling of passing out.  His heart rate need to be closely monitored during the surgery.  From a cardiac perspective, he is at acceptable risk to proceed of was surgery.  He was previously set up to see Dr. Ladona Ridgel to establish with the EP service, however given lack of symptoms, he decided not to go through with EP evaluation.  At this time, he does not need pacemaker given lack of symptoms.  ROS:   He denies chest pain, palpitations, dyspnea, pnd, orthopnea, n, v, dizziness, syncope, edema, weight gain, or early satiety. All other systems reviewed and are otherwise negative except as noted above.    Studies Reviewed: .        Cardiac Studies & Procedures     STRESS TESTS  MYOCARDIAL PERFUSION IMAGING 01/06/2023  Narrative   Findings are consistent with infarction. The study is low risk.   No ST deviation was noted.   LV perfusion is abnormal. There is no evidence of ischemia. There is evidence of infarction. Defect 1: There is a medium defect with severe reduction in uptake present in the apical to basal inferolateral location(s) that is fixed. There is abnormal wall motion in the defect area. Consistent with infarction.   Left ventricular function is normal. Nuclear stress EF: 59%. The left ventricular ejection fraction is normal (55-65%). End diastolic cavity size is mildly enlarged. End systolic cavity size is mildly enlarged.   Prior study available for comparison from 06/23/2018.  Abnormal,  low risk stress nuclear study with prior inferolateral infarct but no ischemia.  Gated ejection fraction 59% with hypokinesis of the inferolateral wall.   ECHOCARDIOGRAM  ECHOCARDIOGRAM COMPLETE 12/09/2020  Narrative ECHOCARDIOGRAM REPORT    Patient Name:   Jose Duarte Date of Exam: 12/09/2020 Medical Rec #:  [id number]          Height:       71.0 in Accession #:    [id number]         Weight:       198.0 lb Date of Birth:  February 18, 1941          BSA:          2.100 m Patient Age:    79  years           BP:           142/80 mmHg Patient Gender: M                  HR:           62 bpm. 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 CAD and Previous Myocardial Infarction, Stroke and TIA, Arrythmias:RBBB; Risk Factors:Sleep Apnea, Dyslipidemia, Hypertension and Diabetes.  Sonographer:    Farrel Conners RDCS Referring Phys: [id number] [address]ade  IMPRESSIONS   1. Left ventricular ejection fraction by 3D volume is 68 %. The left ventricle has normal function. The left ventricle has no regional wall motion abnormalities. Left ventricular diastolic parameters are indeterminate. 2. Right ventricular systolic function is normal. The right ventricular size is normal. There is normal pulmonary artery systolic pressure. 3. The mitral valve is normal in structure. Mild mitral valve regurgitation. No evidence of mitral stenosis. 4. The aortic valve is normal in structure. Aortic valve regurgitation is trivial. 5. There is dilatation of the ascending aorta.  FINDINGS Left Ventricle: Left ventricular ejection fraction by 3D volume is 68 %. The left ventricle has normal function. The left ventricle has no regional wall motion abnormalities. The left ventricular internal cavity size was normal in size. There is no left ventricular hypertrophy. Left ventricular diastolic parameters are indeterminate.  Right Ventricle: The right ventricular size is normal. Right vetricular wall thickness was not well visualized. Right ventricular systolic function is normal. There is normal pulmonary artery systolic pressure. The tricuspid regurgitant velocity is 2.56 m/s, and with an assumed right atrial pressure of 3 mmHg, the estimated right ventricular systolic pressure is 29.2 mmHg.  Left Atrium: Left atrial size  was normal in size.  Right Atrium: Right atrial size was normal in size.  Pericardium: There is no evidence of pericardial effusion.  Mitral Valve: The mitral valve is normal in structure. Mild mitral valve regurgitation. No evidence of mitral valve stenosis.  Tricuspid Valve: The tricuspid valve is normal in structure. Tricuspid valve regurgitation is trivial.  Aortic Valve: The aortic valve is normal in structure. Aortic valve regurgitation is trivial.  Pulmonic Valve: The pulmonic valve was grossly normal. Pulmonic valve regurgitation is not visualized.  Aorta: The aortic root and ascending aorta are structurally normal, with no evidence of dilitation. There is dilatation of the ascending aorta.  IAS/Shunts: The atrial septum is grossly normal.   LEFT VENTRICLE PLAX 2D LVIDd:         4.90

## 2023-03-15 ENCOUNTER — Other Ambulatory Visit: Payer: Self-pay | Admitting: Neurosurgery

## 2023-03-22 ENCOUNTER — Other Ambulatory Visit: Payer: Self-pay | Admitting: Neurosurgery

## 2023-03-25 NOTE — Pre-Procedure Instructions (Signed)
Surgical Instructions   Your procedure is scheduled on April 07, 2023. Report to [facility] Main Entrance "A" at 9:40 A.M., then check in with the Admitting office. Any questions or running late day of surgery: call [phone]  Questions prior to your surgery date: call [phone], Monday-Friday, 8am-4pm. If you experience any cold or flu symptoms such as cough, fever, chills, shortness of breath, etc. between now and your scheduled surgery, please notify us at the above number.     Remember:  Do not eat or drink after midnight the night before your surgery    Take these medicines the morning of surgery with A SIP OF WATER: amLODipine (NORVASC)  pantoprazole (PROTONIX)    May take these medicines IF NEEDED: acetaminophen (TYLENOL)    STOP taking your clopidogrel (PLAVIX) five days prior to surgery. Your last dose will be October 11th.   One week prior to surgery, STOP taking any Aspirin (unless otherwise instructed by your surgeon) Aleve, Naproxen, Ibuprofen, Motrin, Advil, Goody's, BC's, all herbal medications, fish oil, and non-prescription vitamins.   WHAT DO I DO ABOUT MY DIABETES MEDICATION?   Do not take glipiZIDE (GLUCOTROL XL) the evening before surgery or the morning of surgery.  STOP taking your JARDIANCE three days prior to surgery. Last dose will be October 13th.   HOW TO MANAGE YOUR DIABETES BEFORE AND AFTER SURGERY  Why is it important to control my blood sugar before and after surgery? Improving blood sugar levels before and after surgery helps healing and can limit problems. A way of improving blood sugar control is eating a healthy diet by:  Eating less sugar and carbohydrates  Increasing activity/exercise  Talking with your doctor about reaching your blood sugar goals High blood sugars (greater than 180 mg/dL) can raise your risk of infections and slow your recovery, so you will need to focus on controlling your diabetes during the weeks before  surgery. Make sure that the doctor who takes care of your diabetes knows about your planned surgery including the date and location.  How do I manage my blood sugar before surgery? Check your blood sugar at least 4 times a day, starting 2 days before surgery, to make sure that the level is not too high or low.  Check your blood sugar the morning of your surgery when you wake up and every 2 hours until you get to the Short Stay unit.  If your blood sugar is less than 70 mg/dL, you will need to treat for low blood sugar: Do not take insulin. Treat a low blood sugar (less than 70 mg/dL) with  cup of clear juice (cranberry or apple), 4 glucose tablets, OR glucose gel. Recheck blood sugar in 15 minutes after treatment (to make sure it is greater than 70 mg/dL). If your blood sugar is not greater than 70 mg/dL on recheck, call [phone] for further instructions. Report your blood sugar to the short stay nurse when you get to Short Stay.  If you are admitted to the hospital after surgery: Your blood sugar will be checked by the staff and you will probably be given insulin after surgery (instead of oral diabetes medicines) to make sure you have good blood sugar levels. The goal for blood sugar control after surgery is 80-180 mg/dL.                      Do NOT Smoke (Tobacco/Vaping) for 24 hours prior to your procedure.  If you use a CPAP  at night, you may bring your mask/headgear for your overnight stay.   You will be asked to remove any contacts, glasses, piercing's, hearing aid's, dentures/partials prior to surgery. Please bring cases for these items if needed.    Patients discharged the day of surgery will not be allowed to drive home, and someone needs to stay with them for 24 hours.  SURGICAL WAITING ROOM VISITATION Patients may have no more than 2 support people in the waiting area - these visitors may rotate.   Pre-op nurse will coordinate an appropriate time for 1 ADULT support  person, who may not rotate, to accompany patient in pre-op.  Children under the age of 84 must have an adult with them who is not the patient and must remain in the main waiting area with an adult.  If the patient needs to stay at the hospital during part of their recovery, the visitor guidelines for inpatient rooms apply.  Please refer to the [facility] website for the visitor guidelines for any additional information.   If you received a COVID test during your pre-op visit  it is requested that you wear a mask when out in public, stay away from anyone that may not be feeling well and notify your surgeon if you develop symptoms. If you have been in contact with anyone that has tested positive in the last 10 days please notify you surgeon.      Pre-operative 5 CHG Bathing Instructions   You can play a key role in reducing the risk of infection after surgery. Your skin needs to be as free of germs as possible. You can reduce the number of germs on your skin by washing with CHG (chlorhexidine gluconate) soap before surgery. CHG is an antiseptic soap that kills germs and continues to kill germs even after washing.   DO NOT use if you have an allergy to chlorhexidine/CHG or antibacterial soaps. If your skin becomes reddened or irritated, stop using the CHG and notify one of our RNs at [phone].   Please shower with the CHG soap starting 4 days before surgery using the following schedule:     Please keep in mind the following:  DO NOT shave, including legs and underarms, starting the day of your first shower.   You may shave your face at any point before/day of surgery.  Place clean sheets on your bed the day you start using CHG soap. Use a clean washcloth (not used since being washed) for each shower. DO NOT sleep with pets once you start using the CHG.   CHG Shower Instructions:  Wash your face and private area with normal soap. If you choose to wash your hair, wash first with your  normal shampoo.  After you use shampoo/soap, rinse your hair and body thoroughly to remove shampoo/soap residue.  Turn the water OFF and apply about 3 tablespoons (45 ml) of CHG soap to a CLEAN washcloth.  Apply CHG soap ONLY FROM YOUR NECK DOWN TO YOUR TOES (washing for 3-5 minutes)  DO NOT use CHG soap on face, private areas, open wounds, or sores.  Pay special attention to the area where your surgery is being performed.  If you are having back surgery, having someone wash your back for you may be helpful. Wait 2 minutes after CHG soap is applied, then you may rinse off the CHG soap.  Pat dry with a clean towel  Put on clean clothes/pajamas   If you choose to wear lotion, please use  ONLY the CHG-compatible lotions on the back of this paper.   Additional instructions for the day of surgery: DO NOT APPLY any lotions, deodorants, cologne, or perfumes.   Do not bring valuables to the hospital. [facility] is not responsible for any belongings/valuables. Do not wear nail polish, gel polish, artificial nails, or any other type of covering on natural nails (fingers and toes) Do not wear jewelry or makeup Put on clean/comfortable clothes.  Please brush your teeth.  Ask your nurse before applying any prescription medications to the skin.     CHG Compatible Lotions   Aveeno Moisturizing lotion  Cetaphil Moisturizing Cream  Cetaphil Moisturizing Lotion  Clairol Herbal Essence Moisturizing Lotion, Dry Skin  Clairol Herbal Essence Moisturizing Lotion, Extra Dry Skin  Clairol Herbal Essence Moisturizing Lotion, Normal Skin  Curel Age Defying Therapeutic Moisturizing Lotion with Alpha Hydroxy  Curel Extreme Care Body Lotion  Curel Soothing Hands Moisturizing Hand Lotion  Curel Therapeutic Moisturizing Cream, Fragrance-Free  Curel Therapeutic Moisturizing Lotion, Fragrance-Free  Curel Therapeutic Moisturizing Lotion, Original Formula  Eucerin Daily Replenishing Lotion  Eucerin Dry Skin  Therapy Plus Alpha Hydroxy Crme  Eucerin Dry Skin Therapy Plus Alpha Hydroxy Lotion  Eucerin Original Crme  Eucerin Original Lotion  Eucerin Plus Crme Eucerin Plus Lotion  Eucerin TriLipid Replenishing Lotion  Keri Anti-Bacterial Hand Lotion  Keri Deep Conditioning Original Lotion Dry Skin Formula Softly Scented  Keri Deep Conditioning Original Lotion, Fragrance Free Sensitive Skin Formula  Keri Lotion Fast Absorbing Fragrance Free Sensitive Skin Formula  Keri Lotion Fast Absorbing Softly Scented Dry Skin Formula  Keri Original Lotion  Keri Skin Renewal Lotion Keri Silky Smooth Lotion  Keri Silky Smooth Sensitive Skin Lotion  Nivea Body Creamy Conditioning Oil  Nivea Body Extra Enriched Lotion  Nivea Body Original Lotion  Nivea Body Sheer Moisturizing Lotion Nivea Crme  Nivea Skin Firming Lotion  NutraDerm 30 Skin Lotion  NutraDerm Skin Lotion  NutraDerm Therapeutic Skin Cream  NutraDerm Therapeutic Skin Lotion  ProShield Protective Hand Cream  Provon moisturizing lotion  Please read over the following fact sheets that you were given.

## 2023-03-28 ENCOUNTER — Encounter (HOSPITAL_COMMUNITY): Payer: Self-pay

## 2023-03-28 ENCOUNTER — Inpatient Hospital Stay (HOSPITAL_COMMUNITY)
Admission: RE | Admit: 2023-03-28 | Discharge: 2023-03-28 | Disposition: A | Discharge status: Home / Self Care | Payer: Medicare HMO | Source: Ambulatory Visit | Attending: Neurosurgery

## 2023-03-28 ENCOUNTER — Other Ambulatory Visit: Payer: Self-pay

## 2023-03-28 VITALS — BP 136/68 | HR 54 | Temp 98.1°F | Resp 18 | Ht 71.0 in | Wt 181.0 lb

## 2023-03-28 DIAGNOSIS — Z01818 Encounter for other preprocedural examination: Secondary | ICD-10-CM

## 2023-03-28 DIAGNOSIS — E119 Type 2 diabetes mellitus without complications: Secondary | ICD-10-CM | POA: Insufficient documentation

## 2023-03-28 DIAGNOSIS — Z01812 Encounter for preprocedural laboratory examination: Secondary | ICD-10-CM | POA: Diagnosis present

## 2023-03-28 HISTORY — DX: Personal history of urinary calculi: Z87.442

## 2023-03-28 HISTORY — DX: Pneumonia, unspecified organism: J18.9

## 2023-03-28 LAB — CBC
HCT: 46.5 % (ref 39.0–52.0)
Hemoglobin: 14.9 g/dL (ref 13.0–17.0)
MCH: 30 pg (ref 26.0–34.0)
MCHC: 32 g/dL (ref 30.0–36.0)
MCV: 93.6 fL (ref 80.0–100.0)
Platelets: 169 10*3/uL (ref 150–400)
RBC: 4.97 MIL/uL (ref 4.22–5.81)
RDW: 13.6 % (ref 11.5–15.5)
WBC: 4.2 10*3/uL (ref 4.0–10.5)
nRBC: 0 % (ref 0.0–0.2)

## 2023-03-28 LAB — HEMOGLOBIN A1C
Hgb A1c MFr Bld: 7.3 % — ABNORMAL HIGH (ref 4.8–5.6)
Mean Plasma Glucose: 162.81 mg/dL

## 2023-03-28 LAB — TYPE AND SCREEN
ABO/RH(D): A POS
Antibody Screen: NEGATIVE

## 2023-03-28 LAB — BASIC METABOLIC PANEL
Anion gap: 8 (ref 5–15)
BUN: 15 mg/dL (ref 8–23)
CO2: 24 mmol/L (ref 22–32)
Calcium: 9 mg/dL (ref 8.9–10.3)
Chloride: 107 mmol/L (ref 98–111)
Creatinine, Ser: 0.97 mg/dL (ref 0.61–1.24)
GFR, Estimated: >60 mL/min (ref >60)
Glucose, Bld: 138 mg/dL — ABNORMAL HIGH (ref 70–99)
Potassium: 4.1 mmol/L (ref 3.5–5.1)
Sodium: 139 mmol/L (ref 135–145)

## 2023-03-28 LAB — SURGICAL PCR SCREEN
MRSA, PCR: NEGATIVE
Staphylococcus aureus: NEGATIVE

## 2023-03-28 LAB — GLUCOSE, CAPILLARY: Glucose-Capillary: 131 mg/dL — ABNORMAL HIGH (ref 70–99)

## 2023-03-28 NOTE — Progress Notes (Signed)
PCP - Dr. Aliene Beams Cardiologist - Dr. Charlton Haws - Last office visit 03/14/2023  PPM/ICD - Denies - Pt does have a loop recorder that was not surgically removed when it reached end of life. Device Orders - n/a Rep Notified - n/a  Chest x-ray - Denies EKG - 01/03/2023 Stress Test - 01/06/2023 ECHO - 12/09/2020 Cardiac Cath - 03/02/2023  Sleep Study - +OSA, but pt does not wear his CPAP often. He is not aware of his pressure settings.   Pt is DM2. He checks his blood sugar weekly. Normal fasting range is 140-150. CBG at pre-op appointment 131. A1c result pending.  Last dose of GLP1 agonist- n/a GLP1 instructions: n/a  Blood Thinner Instructions: Per surgeon, pt instructed to hold for ?3 days. Last dose will be October 13th. Pt instructed to verify Plavix hold instructions due to uncertainty of amount of days.  Aspirin Instructions: n/a  NPO after midnight  COVID TEST- n/a   Anesthesia review: Yes. Cardiac Clearance. Previous surgery date August 2024 cancelled for insurance reasons.  Patient denies shortness of breath, fever, cough and chest pain at PAT appointment. Pt denies any respiratory illness/infection in the last two months.    All instructions explained to the patient, with a verbal understanding of the material. Patient agrees to go over the instructions while at home for a better understanding. Patient also instructed to self quarantine after being tested for COVID-19. The opportunity to ask questions was provided.

## 2023-03-29 NOTE — Progress Notes (Signed)
Anesthesia Chart Review:  82 year old male follows with cardiology for history of CAD, bifascicular block, sinus bradycardia, hypertension, hyperlipidemia. 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 Emergent cardiac authorization revealed 40% mid D1 lesion, 50% mid LAD lesion, 30 to 50% mid RCA lesion, totally occluded ramus intermedius treated with Promus Premier 3.0 x 16 mm DES. EF was 45 to 50% the time. By 2020, ejection fraction has improved to 60 to 65%. Most recent echocardiogram obtained on 12/09/2020 showed EF 68%, normal RV, mild MR, trivial AI.  Myoview 01/06/2023 that showed EF 59%, overall low risk study, medium defect with severe reduction in uptake present in the apical and basal inferolateral location consistent with previous infarction, no reversible ischemia.  Last seen by Azalee Course, PA on 03/14/2023 for preop evaluation.  Per note, "CAD: He denies any recent chest pain.  He recently underwent Myoview that came back low risk.  He has upcoming back surgery, he is at acceptable risk to proceed from the cardiac perspective.  His heart rate need to be closely monitored during the surgery as he has a history of bradycardia.  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  He is maintained on Plavix for secondary stroke prevention.  Patient stated he thought he was instructed to hold Plavix 3 days prior to surgery.  He was advised to reach out to surgeon to verify instructions.    Non-insulin-dependent DM2, A1c 7.3 on 03/28/2023.  History of OSA, states he does not wear his CPAP often.  Remote history of cervical surgery (1970s).  Preop labs reviewed, WNL.  EKG 01/03/2023: Marked sinus bradycardia.  Rate 44. Right bundle branch block. Left anterior fascicular block  Nuclear stress 01/06/2023:    Findings are consistent with infarction. The study  is low risk.   No ST deviation was noted.   LV perfusion is abnormal. There is no evidence of ischemia. There is evidence of infarction. Defect 1: There is a medium defect with severe reduction in uptake present in the apical to basal inferolateral location(s) that is fixed. There is abnormal wall motion in the defect area. Consistent with infarction.   Left ventricular function is normal. Nuclear stress EF: 59%. The left ventricular ejection fraction is normal (55-65%). End diastolic cavity size is mildly enlarged. End systolic cavity size is mildly enlarged.   Prior study available for comparison from 06/23/2018.   Abnormal, low risk stress nuclear study with prior inferolateral infarct but no ischemia.  Gated ejection fraction 59% with hypokinesis of the inferolateral wall.  TTE 12/09/2020:  1. Left ventricular ejection fraction by 3D volume is 68 %. The left  ventricle has normal function. The left ventricle has no regional wall  motion abnormalities. Left ventricular diastolic parameters are  indeterminate.   2. Right ventricular systolic function is normal. The right ventricular  size is normal. There is normal pulmonary artery systolic pressure.   3. The mitral valve is normal in structure. Mild mitral valve  regurgitation. No evidence of mitral stenosis.   4. The aortic valve is normal in structure. Aortic valve regurgitation is  trivial.   5. There is dilatation of the ascending aorta.    Zannie Cove [facility] Short Stay Center/Anesthesiology Phone [phone] 03/29/2023 3:07 PM\

## 2023-03-29 NOTE — Anesthesia Preprocedure Evaluation (Addendum)
Anesthesia Evaluation  Patient identified by MRN, date of birth, ID band Patient awake    Reviewed: Allergy & Precautions, NPO status , Patient's Chart, lab work & pertinent test results  History of Anesthesia Complications (+) PONV and history of anesthetic complications  Airway Mallampati: III  TM Distance: >3 FB Neck ROM: Full    Dental  (+) Teeth Intact, Dental Advisory Given   Pulmonary neg shortness of breath, sleep apnea , neg COPD, neg recent URI   breath sounds clear to auscultation       Cardiovascular hypertension, Pt. on medications + CAD, + Past MI and + Cardiac Stents   Rhythm:Regular     Neuro/Psych TIA Neuromuscular disease    GI/Hepatic Neg liver ROS,GERD  Medicated and Controlled,,  Endo/Other  diabetes    Renal/GU negative Renal ROS     Musculoskeletal  (+) Arthritis ,    Abdominal   Peds  Hematology Lab Results      Component                Value               Date                      WBC                      4.2                 03/28/2023                HGB                      14.9                03/28/2023                HCT                      46.5                03/28/2023                MCV                      93.6                03/28/2023                PLT                      169                 03/28/2023              Anesthesia Other Findings   Reproductive/Obstetrics                              Anesthesia Physical Anesthesia Plan  ASA: 3  Anesthesia Plan: General   Post-op Pain Management: Ofirmev IV (intra-op)*   Induction: Intravenous  PONV Risk Score and Plan: 3 and Ondansetron and Dexamethasone  Airway Management Planned: Oral ETT  Additional Equipment: None  Intra-op Plan:   Post-operative Plan: Extubation in OR  Informed Consent: I have reviewed the patients History and Physical, chart, labs and discussed the procedure including  the risks,  benefits and alternatives for the proposed anesthesia with the patient or authorized representative who has indicated his/her understanding and acceptance.     Dental advisory given  Plan Discussed with: CRNA  Anesthesia Plan Comments: (PAT note by Antionette Poles, PA-C: 82 year old male follows with cardiology for history of CAD, bifascicular block, sinus bradycardia, hypertension, hyperlipidemia. 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 Emergent cardiac authorization revealed 40% mid D1 lesion, 50% mid LAD lesion, 30 to 50% mid RCA lesion, totally occluded ramus intermedius treated with Promus Premier 3.0 x 16 mm DES. EF was 45 to 50% the time. By 2020, ejection fraction has improved to 60 to 65%. Most recent echocardiogram obtained on 12/09/2020 showed EF 68%, normal RV, mild MR, trivial AI.  Myoview 01/06/2023 that showed EF 59%, overall low risk study, medium defect with severe reduction in uptake present in the apical and basal inferolateral location consistent with previous infarction, no reversible ischemia.  Last seen by Azalee Course, PA on 03/14/2023 for preop evaluation.  Per note, "CAD: He denies any recent chest pain.  He recently underwent Myoview that came back low risk.  He has upcoming back surgery, he is at acceptable risk to proceed from the cardiac perspective.  His heart rate need to be closely monitored during the surgery as he has a history of bradycardia.  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  He is maintained on Plavix for secondary stroke prevention.  Patient stated he thought he was instructed to hold Plavix 3 days prior to surgery.  He was advised to reach out to surgeon to verify instructions.    Non-insulin-dependent DM2, A1c 7.3 on 03/28/2023.  History of OSA, states he does not wear his CPAP often.  Remote  history of cervical surgery (1970s).  Preop labs reviewed, WNL.  EKG 01/03/2023: Marked sinus bradycardia.  Rate 44. Right bundle branch block. Left anterior fascicular block  Nuclear stress 01/06/2023:    Findings are consistent with infarction. The study is low risk.   No ST deviation was noted.   LV perfusion is abnormal. There is no evidence of ischemia. There is evidence of infarction. Defect 1: There is a medium defect with severe reduction in uptake present in the apical to basal inferolateral location(s) that is fixed. There is abnormal wall motion in the defect area. Consistent with infarction.   Left ventricular function is normal. Nuclear stress EF: 59%. The left ventricular ejection fraction is normal (55-65%). End diastolic cavity size is mildly enlarged. End systolic cavity size is mildly enlarged.   Prior study available for comparison from 06/23/2018.   Abnormal, low risk stress nuclear study with prior inferolateral infarct but no ischemia.  Gated ejection fraction 59% with hypokinesis of the inferolateral wall.  TTE 12/09/2020:  1. Left ventricular ejection fraction by 3D volume is 68 %. The left  ventricle has normal function. The left ventricle has no regional wall  motion abnormalities. Left ventricular diastolic parameters are  indeterminate.   2. Right ventricular systolic function is normal. The right ventricular  size is normal. There is normal pulmonary artery systolic pressure.   3. The mitral valve is normal in structure. Mild mitral valve  regurgitation. No evidence of mitral stenosis.   4. The aortic valve is normal in structure. Aortic valve regurgitation is  trivial.   5. There is dilatation of the  ascending aorta.    )         Anesthesia Quick Evaluation

## 2023-04-07 ENCOUNTER — Ambulatory Visit (HOSPITAL_BASED_OUTPATIENT_CLINIC_OR_DEPARTMENT_OTHER): Payer: Medicare HMO

## 2023-04-07 ENCOUNTER — Encounter (HOSPITAL_COMMUNITY): Payer: Self-pay | Admitting: Neurosurgery

## 2023-04-07 ENCOUNTER — Other Ambulatory Visit: Payer: Self-pay

## 2023-04-07 ENCOUNTER — Ambulatory Visit (HOSPITAL_COMMUNITY): Payer: Medicare HMO | Admitting: Physician Assistant

## 2023-04-07 ENCOUNTER — Ambulatory Visit (HOSPITAL_COMMUNITY)
Admission: RE | Admit: 2023-04-07 | Discharge: 2023-04-08 | Disposition: A | Discharge status: Home Health | Payer: Medicare HMO | Attending: Neurosurgery | Admitting: Neurosurgery

## 2023-04-07 ENCOUNTER — Encounter (HOSPITAL_COMMUNITY)
Admission: RE | Disposition: A | Discharge status: Home Health | Payer: Self-pay | Source: Home / Self Care | Attending: Neurosurgery

## 2023-04-07 ENCOUNTER — Ambulatory Visit (HOSPITAL_COMMUNITY): Payer: Medicare HMO

## 2023-04-07 DIAGNOSIS — Z96698 Presence of other orthopedic joint implants: Secondary | ICD-10-CM | POA: Diagnosis not present

## 2023-04-07 DIAGNOSIS — M4726 Other spondylosis with radiculopathy, lumbar region: Secondary | ICD-10-CM | POA: Insufficient documentation

## 2023-04-07 DIAGNOSIS — M4316 Spondylolisthesis, lumbar region: Secondary | ICD-10-CM | POA: Diagnosis present

## 2023-04-07 DIAGNOSIS — K219 Gastro-esophageal reflux disease without esophagitis: Secondary | ICD-10-CM | POA: Diagnosis not present

## 2023-04-07 DIAGNOSIS — G4733 Obstructive sleep apnea (adult) (pediatric): Secondary | ICD-10-CM | POA: Insufficient documentation

## 2023-04-07 DIAGNOSIS — E119 Type 2 diabetes mellitus without complications: Secondary | ICD-10-CM | POA: Insufficient documentation

## 2023-04-07 DIAGNOSIS — I10 Essential (primary) hypertension: Secondary | ICD-10-CM | POA: Diagnosis not present

## 2023-04-07 DIAGNOSIS — I2510 Atherosclerotic heart disease of native coronary artery without angina pectoris: Secondary | ICD-10-CM | POA: Insufficient documentation

## 2023-04-07 DIAGNOSIS — M48062 Spinal stenosis, lumbar region with neurogenic claudication: Secondary | ICD-10-CM | POA: Diagnosis not present

## 2023-04-07 DIAGNOSIS — Z955 Presence of coronary angioplasty implant and graft: Secondary | ICD-10-CM | POA: Insufficient documentation

## 2023-04-07 LAB — GLUCOSE, CAPILLARY
Glucose-Capillary: 171 mg/dL — ABNORMAL HIGH (ref 70–99)
Glucose-Capillary: 154 mg/dL — ABNORMAL HIGH (ref 70–99)
Glucose-Capillary: 185 mg/dL — ABNORMAL HIGH (ref 70–99)
Glucose-Capillary: 222 mg/dL — ABNORMAL HIGH (ref 70–99)

## 2023-04-07 LAB — ABO/RH: ABO/RH(D): A POS

## 2023-04-07 SURGERY — POSTERIOR LUMBAR FUSION 1 LEVEL
Anesthesia: General | Site: Spine Lumbar | Laterality: Bilateral

## 2023-04-07 MED ORDER — SUCCINYLCHOLINE CHLORIDE 200 MG/10ML IV SOSY
PREFILLED_SYRINGE | INTRAVENOUS | Status: AC
  Filled 2023-04-07: qty 10

## 2023-04-07 MED ORDER — GLIPIZIDE ER 10 MG PO TB24
10.0000 mg | ORAL_TABLET | Freq: Every day | ORAL | Status: DC
  Filled 2023-04-07: qty 1

## 2023-04-07 MED ORDER — GLYCOPYRROLATE 0.2 MG/ML IJ SOLN
INTRAMUSCULAR | Status: DC | PRN
Start: 2023-04-07 — End: 2023-04-07
  Administered 2023-04-07 (×2): .2 mg via INTRAVENOUS

## 2023-04-07 MED ORDER — ONDANSETRON HCL 4 MG/2ML IJ SOLN
INTRAMUSCULAR | Status: AC
  Filled 2023-04-07: qty 2

## 2023-04-07 MED ORDER — OXYCODONE HCL 5 MG PO TABS
10.0000 mg | ORAL_TABLET | ORAL | Status: DC | PRN
  Administered 2023-04-08: 10 mg via ORAL
  Filled 2023-04-07 (×2): qty 2

## 2023-04-07 MED ORDER — 0.9 % SODIUM CHLORIDE (POUR BTL) OPTIME
TOPICAL | Status: DC | PRN
  Administered 2023-04-07: 1000 mL

## 2023-04-07 MED ORDER — ACETAMINOPHEN 10 MG/ML IV SOLN
INTRAVENOUS | Status: AC
  Filled 2023-04-07: qty 100

## 2023-04-07 MED ORDER — SODIUM CHLORIDE 0.9% FLUSH
10.0000 mL | Freq: Two times a day (BID) | INTRAVENOUS | Status: DC
  Administered 2023-04-07: 10 mL via INTRAVENOUS

## 2023-04-07 MED ORDER — BUPIVACAINE-EPINEPHRINE (PF) 0.5% -1:200000 IJ SOLN
INTRAMUSCULAR | Status: AC
  Filled 2023-04-07: qty 30

## 2023-04-07 MED ORDER — ACETAMINOPHEN 325 MG PO TABS: 650.0000 mg | ORAL_TABLET | ORAL | Status: DC | PRN

## 2023-04-07 MED ORDER — FENTANYL CITRATE (PF) 250 MCG/5ML IJ SOLN
INTRAMUSCULAR | Status: AC
  Filled 2023-04-07: qty 5

## 2023-04-07 MED ORDER — MORPHINE SULFATE (PF) 4 MG/ML IV SOLN: 4.0000 mg | INTRAVENOUS | Status: DC | PRN

## 2023-04-07 MED ORDER — ORAL CARE MOUTH RINSE: 15.0000 mL | Freq: Once | OROMUCOSAL | Status: AC

## 2023-04-07 MED ORDER — EMPAGLIFLOZIN 25 MG PO TABS
25.0000 mg | ORAL_TABLET | Freq: Every day | ORAL | Status: DC
  Administered 2023-04-07 – 2023-04-08 (×2): 25 mg via ORAL
  Filled 2023-04-07 (×2): qty 1

## 2023-04-07 MED ORDER — ROSUVASTATIN CALCIUM 5 MG PO TABS
10.0000 mg | ORAL_TABLET | Freq: Every day | ORAL | Status: DC
  Administered 2023-04-07: 10 mg via ORAL
  Filled 2023-04-07 (×2): qty 2

## 2023-04-07 MED ORDER — OXYCODONE HCL 5 MG PO TABS: 5.0000 mg | ORAL_TABLET | Freq: Once | ORAL | Status: AC | PRN

## 2023-04-07 MED ORDER — DEXAMETHASONE SODIUM PHOSPHATE 10 MG/ML IJ SOLN
INTRAMUSCULAR | Status: AC
  Filled 2023-04-07: qty 1

## 2023-04-07 MED ORDER — SODIUM CHLORIDE 0.9% FLUSH: 3.0000 mL | INTRAVENOUS | Status: DC | PRN

## 2023-04-07 MED ORDER — BACITRACIN ZINC 500 UNIT/GM EX OINT
TOPICAL_OINTMENT | CUTANEOUS | Status: AC
  Filled 2023-04-07: qty 28.35

## 2023-04-07 MED ORDER — FENTANYL CITRATE (PF) 250 MCG/5ML IJ SOLN
INTRAMUSCULAR | Status: DC | PRN
  Administered 2023-04-07: 50 ug via INTRAVENOUS
  Administered 2023-04-07: 100 ug via INTRAVENOUS
  Administered 2023-04-07 (×2): 50 ug via INTRAVENOUS

## 2023-04-07 MED ORDER — ROCURONIUM BROMIDE 10 MG/ML (PF) SYRINGE
PREFILLED_SYRINGE | INTRAVENOUS | Status: DC | PRN
  Administered 2023-04-07: 20 mg via INTRAVENOUS
  Administered 2023-04-07 (×2): 10 mg via INTRAVENOUS
  Administered 2023-04-07: 60 mg via INTRAVENOUS

## 2023-04-07 MED ORDER — INSULIN ASPART 100 UNIT/ML IJ SOLN: 0.0000 [IU] | INTRAMUSCULAR | Status: DC | PRN

## 2023-04-07 MED ORDER — SODIUM CHLORIDE 0.9% FLUSH: 3.0000 mL | Freq: Two times a day (BID) | INTRAVENOUS | Status: DC

## 2023-04-07 MED ORDER — OXYCODONE HCL 5 MG/5ML PO SOLN
5.0000 mg | Freq: Once | ORAL | Status: AC | PRN
  Administered 2023-04-07: 5 mg via ORAL

## 2023-04-07 MED ORDER — BUPIVACAINE LIPOSOME 1.3 % IJ SUSP
INTRAMUSCULAR | Status: DC | PRN
  Administered 2023-04-07: 20 mL

## 2023-04-07 MED ORDER — VANCOMYCIN HCL IN DEXTROSE 1-5 GM/200ML-% IV SOLN
1000.0000 mg | INTRAVENOUS | Status: AC
  Administered 2023-04-07: 1000 mg via INTRAVENOUS
  Filled 2023-04-07: qty 200

## 2023-04-07 MED ORDER — ROCURONIUM BROMIDE 10 MG/ML (PF) SYRINGE
PREFILLED_SYRINGE | INTRAVENOUS | Status: AC
  Filled 2023-04-07: qty 10

## 2023-04-07 MED ORDER — EPHEDRINE SULFATE-NACL 50-0.9 MG/10ML-% IV SOSY
PREFILLED_SYRINGE | INTRAVENOUS | Status: DC | PRN
  Administered 2023-04-07 (×2): 10 mg via INTRAVENOUS

## 2023-04-07 MED ORDER — ACETAMINOPHEN 160 MG/5ML PO SOLN: 1000.0000 mg | Freq: Once | ORAL | Status: DC | PRN

## 2023-04-07 MED ORDER — LOSARTAN POTASSIUM 50 MG PO TABS
100.0000 mg | ORAL_TABLET | Freq: Every day | ORAL | Status: DC
  Filled 2023-04-07: qty 2

## 2023-04-07 MED ORDER — PANTOPRAZOLE SODIUM 40 MG PO TBEC
40.0000 mg | DELAYED_RELEASE_TABLET | Freq: Every day | ORAL | Status: DC
  Administered 2023-04-08: 40 mg via ORAL
  Filled 2023-04-07: qty 1

## 2023-04-07 MED ORDER — VANCOMYCIN HCL IN DEXTROSE 1-5 GM/200ML-% IV SOLN
1000.0000 mg | Freq: Two times a day (BID) | INTRAVENOUS | Status: AC
  Administered 2023-04-07: 1000 mg via INTRAVENOUS
  Filled 2023-04-07: qty 200

## 2023-04-07 MED ORDER — THROMBIN 5000 UNITS EX SOLR
CUTANEOUS | Status: AC
  Filled 2023-04-07: qty 5000

## 2023-04-07 MED ORDER — BUPIVACAINE-EPINEPHRINE (PF) 0.5% -1:200000 IJ SOLN
INTRAMUSCULAR | Status: DC | PRN
  Administered 2023-04-07: 10 mL

## 2023-04-07 MED ORDER — CYCLOBENZAPRINE HCL 10 MG PO TABS
10.0000 mg | ORAL_TABLET | Freq: Three times a day (TID) | ORAL | Status: DC | PRN
  Administered 2023-04-08: 10 mg via ORAL
  Filled 2023-04-07 (×2): qty 1

## 2023-04-07 MED ORDER — ONDANSETRON HCL 4 MG/2ML IJ SOLN: 4.0000 mg | Freq: Four times a day (QID) | INTRAMUSCULAR | Status: DC | PRN

## 2023-04-07 MED ORDER — INSULIN ASPART 100 UNIT/ML IJ SOLN: 0.0000 [IU] | INTRAMUSCULAR | Status: DC

## 2023-04-07 MED ORDER — ACETAMINOPHEN 500 MG PO TABS: 1000.0000 mg | ORAL_TABLET | Freq: Once | ORAL | Status: DC | PRN

## 2023-04-07 MED ORDER — THROMBIN 5000 UNITS EX SOLR: OROMUCOSAL | Status: DC | PRN

## 2023-04-07 MED ORDER — CHLORHEXIDINE GLUCONATE CLOTH 2 % EX PADS: 6.0000 | MEDICATED_PAD | Freq: Once | CUTANEOUS | Status: DC

## 2023-04-07 MED ORDER — DEXMEDETOMIDINE HCL IN NACL 200 MCG/50ML IV SOLN
INTRAVENOUS | Status: DC | PRN
Start: 2023-04-07 — End: 2023-04-07
  Administered 2023-04-07 (×4): 10 ug via INTRAVENOUS

## 2023-04-07 MED ORDER — BISACODYL 5 MG PO TBEC
5.0000 mg | DELAYED_RELEASE_TABLET | Freq: Every day | ORAL | Status: DC | PRN
  Administered 2023-04-07: 5 mg via ORAL
  Filled 2023-04-07: qty 1

## 2023-04-07 MED ORDER — LIDOCAINE 2% (20 MG/ML) 5 ML SYRINGE
INTRAMUSCULAR | Status: AC
  Filled 2023-04-07: qty 5

## 2023-04-07 MED ORDER — INSULIN ASPART 100 UNIT/ML IJ SOLN
0.0000 [IU] | Freq: Three times a day (TID) | INTRAMUSCULAR | Status: DC
  Administered 2023-04-07: 2 [IU] via SUBCUTANEOUS

## 2023-04-07 MED ORDER — LACTATED RINGERS IV SOLN: INTRAVENOUS | Status: DC

## 2023-04-07 MED ORDER — PROPOFOL 10 MG/ML IV BOLUS
INTRAVENOUS | Status: DC | PRN
  Administered 2023-04-07: 50 ug/kg/min via INTRAVENOUS
  Administered 2023-04-07: 120 mg via INTRAVENOUS

## 2023-04-07 MED ORDER — AMLODIPINE BESYLATE 5 MG PO TABS
5.0000 mg | ORAL_TABLET | Freq: Every day | ORAL | Status: DC
  Filled 2023-04-07: qty 1

## 2023-04-07 MED ORDER — SUGAMMADEX SODIUM 200 MG/2ML IV SOLN
INTRAVENOUS | Status: DC | PRN
  Administered 2023-04-07: 200 mg via INTRAVENOUS

## 2023-04-07 MED ORDER — OXYCODONE HCL 5 MG/5ML PO SOLN
ORAL | Status: AC
  Filled 2023-04-07: qty 5

## 2023-04-07 MED ORDER — TAMSULOSIN HCL 0.4 MG PO CAPS
0.4000 mg | ORAL_CAPSULE | Freq: Every day | ORAL | Status: DC
  Administered 2023-04-07 – 2023-04-08 (×2): 0.4 mg via ORAL
  Filled 2023-04-07 (×2): qty 1

## 2023-04-07 MED ORDER — MENTHOL 3 MG MT LOZG: 1.0000 | LOZENGE | OROMUCOSAL | Status: DC | PRN

## 2023-04-07 MED ORDER — PHENYLEPHRINE HCL-NACL 20-0.9 MG/250ML-% IV SOLN
INTRAVENOUS | Status: DC | PRN
  Administered 2023-04-07: 20 ug/min via INTRAVENOUS

## 2023-04-07 MED ORDER — SURGIPHOR WOUND IRRIGATION SYSTEM - OPTIME: TOPICAL | Status: DC | PRN

## 2023-04-07 MED ORDER — DEXAMETHASONE SODIUM PHOSPHATE 10 MG/ML IJ SOLN
INTRAMUSCULAR | Status: DC | PRN
  Administered 2023-04-07: 10 mg via INTRAVENOUS

## 2023-04-07 MED ORDER — ONDANSETRON HCL 4 MG PO TABS: 4.0000 mg | ORAL_TABLET | Freq: Four times a day (QID) | ORAL | Status: DC | PRN

## 2023-04-07 MED ORDER — PHENOL 1.4 % MT LIQD: 1.0000 | OROMUCOSAL | Status: DC | PRN

## 2023-04-07 MED ORDER — LIDOCAINE 2% (20 MG/ML) 5 ML SYRINGE
INTRAMUSCULAR | Status: DC | PRN
  Administered 2023-04-07: 50 mg via INTRAVENOUS

## 2023-04-07 MED ORDER — ACETAMINOPHEN 10 MG/ML IV SOLN
1000.0000 mg | Freq: Once | INTRAVENOUS | Status: DC | PRN
  Administered 2023-04-07: 1000 mg via INTRAVENOUS

## 2023-04-07 MED ORDER — BISACODYL 10 MG RE SUPP: 10.0000 mg | Freq: Every day | RECTAL | Status: DC | PRN

## 2023-04-07 MED ORDER — ACETAMINOPHEN 650 MG RE SUPP: 650.0000 mg | RECTAL | Status: DC | PRN

## 2023-04-07 MED ORDER — ONDANSETRON HCL 4 MG/2ML IJ SOLN
INTRAMUSCULAR | Status: DC | PRN
  Administered 2023-04-07: 4 mg via INTRAVENOUS

## 2023-04-07 MED ORDER — BACITRACIN ZINC 500 UNIT/GM EX OINT
TOPICAL_OINTMENT | CUTANEOUS | Status: DC | PRN
  Administered 2023-04-07: 1 via TOPICAL

## 2023-04-07 MED ORDER — LACTATED RINGERS IV SOLN
INTRAVENOUS | Status: DC | PRN
Start: 2023-04-07 — End: 2023-04-07

## 2023-04-07 MED ORDER — FENTANYL CITRATE (PF) 100 MCG/2ML IJ SOLN
INTRAMUSCULAR | Status: AC
  Filled 2023-04-07: qty 2

## 2023-04-07 MED ORDER — BUPIVACAINE LIPOSOME 1.3 % IJ SUSP
INTRAMUSCULAR | Status: AC
  Filled 2023-04-07: qty 20

## 2023-04-07 MED ORDER — FENTANYL CITRATE (PF) 100 MCG/2ML IJ SOLN
25.0000 ug | INTRAMUSCULAR | Status: DC | PRN
  Administered 2023-04-07: 25 ug via INTRAVENOUS

## 2023-04-07 MED ORDER — OXYCODONE HCL 5 MG PO TABS
5.0000 mg | ORAL_TABLET | ORAL | Status: DC | PRN
  Administered 2023-04-07 (×2): 5 mg via ORAL
  Filled 2023-04-07: qty 1

## 2023-04-07 MED ORDER — ACETAMINOPHEN 500 MG PO TABS
1000.0000 mg | ORAL_TABLET | Freq: Four times a day (QID) | ORAL | Status: DC
  Administered 2023-04-08 (×2): 1000 mg via ORAL
  Filled 2023-04-07 (×4): qty 2

## 2023-04-07 MED ORDER — CHLORHEXIDINE GLUCONATE 0.12 % MT SOLN
15.0000 mL | Freq: Once | OROMUCOSAL | Status: AC
  Administered 2023-04-07: 15 mL via OROMUCOSAL
  Filled 2023-04-07: qty 15

## 2023-04-07 MED ORDER — PROPOFOL 10 MG/ML IV BOLUS
INTRAVENOUS | Status: AC
  Filled 2023-04-07: qty 20

## 2023-04-07 MED ORDER — DOCUSATE SODIUM 100 MG PO CAPS
100.0000 mg | ORAL_CAPSULE | Freq: Two times a day (BID) | ORAL | Status: DC
  Administered 2023-04-07 – 2023-04-08 (×2): 100 mg via ORAL
  Filled 2023-04-07 (×2): qty 1

## 2023-04-07 SURGICAL SUPPLY — 67 items
APL SKNCLS STERI-STRIP NONHPOA (GAUZE/BANDAGES/DRESSINGS) ×1
BAG COUNTER SPONGE SURGICOUNT (BAG) ×1 IMPLANT
BAG SPNG CNTER NS LX DISP (BAG) ×1
BASKET BONE COLLECTION (BASKET) ×1 IMPLANT
BENZOIN TINCTURE PRP APPL 2/3 (GAUZE/BANDAGES/DRESSINGS) ×1 IMPLANT
BLADE CLIPPER SURG (BLADE) IMPLANT
BUR MATCHSTICK NEURO 3.0 LAGG (BURR) ×1 IMPLANT
BUR PRECISION FLUTE 6.0 (BURR) ×1 IMPLANT
CAGE ALTERA 10X31X9-13 15D (Cage) IMPLANT
CANISTER SUCT 3000ML PPV (MISCELLANEOUS) ×1 IMPLANT
CAP LOCK DLX THRD (Cap) IMPLANT
CLEANSER WND VASHE INSTL 34OZ (WOUND CARE) IMPLANT
CNTNR URN SCR LID CUP LEK RST (MISCELLANEOUS) ×1 IMPLANT
CONT SPEC 4OZ STRL OR WHT (MISCELLANEOUS) ×1
COVER BACK TABLE 60X90IN (DRAPES) ×1 IMPLANT
DRAPE C-ARM 42X72 X-RAY (DRAPES) ×2 IMPLANT
DRAPE HALF SHEET 40X57 (DRAPES) ×1 IMPLANT
DRAPE LAPAROTOMY 100X72X124 (DRAPES) ×1 IMPLANT
DRAPE SURG 17X23 STRL (DRAPES) ×4 IMPLANT
DRSG OPSITE POSTOP 4X6 (GAUZE/BANDAGES/DRESSINGS) ×1 IMPLANT
ELECT BLADE 4.0 EZ CLEAN MEGAD (MISCELLANEOUS) ×2
ELECT REM PT RETURN 9FT ADLT (ELECTROSURGICAL) ×1
ELECTRODE BLDE 4.0 EZ CLN MEGD (MISCELLANEOUS) ×1 IMPLANT
ELECTRODE REM PT RTRN 9FT ADLT (ELECTROSURGICAL) ×1 IMPLANT
EVACUATOR 1/8 PVC DRAIN (DRAIN) IMPLANT
GAUZE 4X4 16PLY ~~LOC~~+RFID DBL (SPONGE) ×1 IMPLANT
GLOVE BIO SURGEON STRL SZ 6 (GLOVE) ×1 IMPLANT
GLOVE BIO SURGEON STRL SZ8 (GLOVE) ×2 IMPLANT
GLOVE BIO SURGEON STRL SZ8.5 (GLOVE) ×2 IMPLANT
GLOVE BIOGEL PI IND STRL 6.5 (GLOVE) ×1 IMPLANT
GLOVE EXAM NITRILE XL STR (GLOVE) IMPLANT
GOWN STRL REUS W/ TWL LRG LVL3 (GOWN DISPOSABLE) ×1 IMPLANT
GOWN STRL REUS W/ TWL XL LVL3 (GOWN DISPOSABLE) ×2 IMPLANT
GOWN STRL REUS W/TWL 2XL LVL3 (GOWN DISPOSABLE) IMPLANT
GOWN STRL REUS W/TWL LRG LVL3 (GOWN DISPOSABLE) ×1
GOWN STRL REUS W/TWL XL LVL3 (GOWN DISPOSABLE) ×2
HEMOSTAT POWDER KIT SURGIFOAM (HEMOSTASIS) ×1 IMPLANT
KIT BASIN OR (CUSTOM PROCEDURE TRAY) ×1 IMPLANT
KIT GRAFTMAG DEL NEURO DISP (NEUROSURGERY SUPPLIES) IMPLANT
KIT POSITION SURG JACKSON T1 (MISCELLANEOUS) ×1 IMPLANT
KIT TURNOVER KIT B (KITS) ×1 IMPLANT
NDL HYPO 21X1.5 SAFETY (NEEDLE) IMPLANT
NDL HYPO 22X1.5 SAFETY MO (MISCELLANEOUS) ×1 IMPLANT
NEEDLE HYPO 21X1.5 SAFETY (NEEDLE) IMPLANT
NEEDLE HYPO 22X1.5 SAFETY MO (MISCELLANEOUS) ×1 IMPLANT
NS IRRIG 1000ML POUR BTL (IV SOLUTION) ×1 IMPLANT
PACK LAMINECTOMY NEURO (CUSTOM PROCEDURE TRAY) ×1 IMPLANT
PAD ARMBOARD 7.5X6 YLW CONV (MISCELLANEOUS) ×3 IMPLANT
PATTIES SURGICAL .5 X1 (DISPOSABLE) IMPLANT
PUTTY DBM 10CC CALC GRAN (Putty) IMPLANT
PUTTY DBM 5CC CALC GRAN (Putty) IMPLANT
ROD CREO DLX CVD 6.35X40 (Rod) IMPLANT
SCREW PA DLX CREO 7.5X50 (Screw) IMPLANT
SCREW PA DLX CREO 7.5X55 (Screw) IMPLANT
SOLUTION IRRIG SURGIPHOR (IV SOLUTION) ×1 IMPLANT
SPONGE NEURO XRAY DETECT 1X3 (DISPOSABLE) IMPLANT
SPONGE SURGIFOAM ABS GEL 100 (HEMOSTASIS) IMPLANT
SPONGE T-LAP 4X18 ~~LOC~~+RFID (SPONGE) IMPLANT
STRIP CLOSURE SKIN 1/2X4 (GAUZE/BANDAGES/DRESSINGS) ×1 IMPLANT
SUT VIC AB 1 CT1 18XBRD ANBCTR (SUTURE) ×2 IMPLANT
SUT VIC AB 1 CT1 8-18 (SUTURE) ×1
SUT VIC AB 2-0 CP2 18 (SUTURE) ×2 IMPLANT
SYR 20ML LL LF (SYRINGE) IMPLANT
TOWEL GREEN STERILE (TOWEL DISPOSABLE) ×1 IMPLANT
TOWEL GREEN STERILE FF (TOWEL DISPOSABLE) ×1 IMPLANT
TRAY FOLEY MTR SLVR 16FR STAT (SET/KITS/TRAYS/PACK) ×1 IMPLANT
WATER STERILE IRR 1000ML POUR (IV SOLUTION) ×1 IMPLANT

## 2023-04-07 NOTE — Anesthesia Procedure Notes (Signed)
Procedure Name: Intubation Date/Time: 04/07/2023 12:41 PM  Performed by: Pincus Large, CRNAPre-anesthesia Checklist: Patient identified, Emergency Drugs available, Suction available and Patient being monitored Patient Re-evaluated:Patient Re-evaluated prior to induction Oxygen Delivery Method: Circle System Utilized Preoxygenation: Pre-oxygenation with 100% oxygen Induction Type: IV induction Ventilation: Mask ventilation without difficulty Laryngoscope Size: Mac and 3 Grade View: Grade I Tube type: Oral Tube size: 7.5 mm Number of attempts: 1 Airway Equipment and Method: Stylet and Oral airway Placement Confirmation: ETT inserted through vocal cords under direct vision, positive ETCO2 and breath sounds checked- equal and bilateral Secured at: 23 cm Tube secured with: Tape Dental Injury: Teeth and Oropharynx as per pre-operative assessment

## 2023-04-07 NOTE — Progress Notes (Signed)
Pharmacy Antibiotic Note  Jose Duarte is a 82 y.o. male admitted on 04/07/2023 with surgical prophylaxis.  Pharmacy has been consulted for Vancomycin dosing.  Plan: Vancomycin 1gm IV once (12 hours post pre-op dose) Pharmacy will sign off - please reconsult if needed  Height: 5\' 11"  (180.3 cm) Weight: 79.4 kg (175 lb) IBW/kg (Calculated) : 75.3  Temp (24hrs), Avg:97.6 F (36.4 C), Min:96.9 F (36.1 C), Max:98 F (36.7 C)  No results for input(s): "WBC", "CREATININE", "LATICACIDVEN", "VANCOTROUGH", "VANCOPEAK", "VANCORANDOM", "GENTTROUGH", "GENTPEAK", "GENTRANDOM", "TOBRATROUGH", "TOBRAPEAK", "TOBRARND", "AMIKACINPEAK", "AMIKACINTROU", "AMIKACIN" in the last 168 hours.  Estimated Creatinine Clearance: 63.6 mL/min (by C-G formula based on SCr of 0.97 mg/dL).    Allergies  Allergen Reactions   Penicillins Anaphylaxis    Knots on head; tongue swelling    Thank you for allowing pharmacy to be a part of this patient's care.  Christoper Fabian, PharmD, BCPS Please see amion for complete clinical pharmacist phone list 04/07/2023 5:54 PM

## 2023-04-07 NOTE — H&P (Signed)
Subjective: The patient is an 82 year old white male who was complained of back and left greater right leg pain consistent with lumbar radiculopathy/neurogenic claudication.  He has failed medical management and was worked up with lumbar x-rays and lumbar MRI which demonstrated a lumbar spondylolisthesis and spinal stenosis.  I discussed the various treatment options with him.  He has decided proceed with surgery.  Past Medical History:  Diagnosis Date   Arthritis    CAD S/P percutaneous coronary angioplasty - PCI RCA cardiologist-  dr Eden Emms   03-01-2013 Promus Premier DES 3.0 mm x 16 mm - post-dilated to 3.2 mm - Dr. Tresa Endo    Cataract    Diverticulosis of colon    Family history of adverse reaction to anesthesia    brother --  ponv   GERD (gastroesophageal reflux disease)    History of adenomatous polyp of colon    History of CVA (cerebrovascular accident)    01-06-2009  right posterior temporal and occipital lobe   History of kidney stones    History of nonmelanoma skin cancer    EXCISION OF EAR   History of ST elevation myocardial infarction (STEMI) 03/01/2013   inferolateral s/p  DES to RI   History of transient ischemic attack (TIA)    2006  approx   HTN (hypertension), benign 03/02/2013   Hyperlipidemia    Hypertension    Incomplete right bundle branch block    Ischemic cardiomyopathy    ef 45-50% per last echo 2016 and myview stress ef 52% 2016   Myocardial infarction (HCC)    Nocturia    OSA (obstructive sleep apnea)    study done  approx 2006--  no cpap   Peripheral neuropathy    Phimosis    Pneumonia    PONV (postoperative nausea and vomiting)    S/P drug eluting coronary stent placement 03/01/2013   x1 to [place]   Stroke (HCC)    2 "mini" strokes, no residual   Type 2 diabetes mellitus (HCC)    Wears glasses     Past Surgical History:  Procedure Laterality Date   APPENDECTOMY     CARDIAC CATHETERIZATION  09-18-2004   [facility]   no significant cad, normal LVF, ef  60%   CARDIOVASCULAR STRESS TEST  03-24-2015   dr Eden Emms   Low risk nuclear study w/ no ischemia;  (previous known lateral wall MI) non-reversible large moderate severity defect in the basal and mid inferolateral , basal and mid anterolateral ;  nuclear stress ef 52%   CATARACT EXTRACTION Bilateral    CERVICAL SPINE SURGERY  1980's   CIRCUMCISION N/A 03/26/2016   Procedure: CIRCUMCISION ADULT;  Surgeon: Ihor Gully, MD;  Location[place] [facility];  Service: Urology;  Laterality: N/A;   DIAGNOSTIC LAPAROSCOPY     EXPLORATORY LAPAROTOMY/  RIGHT COLECTOMY/  DRAINAGE INTRA-ABDOMINAL SUBPHRENIC ABSCESS AND EXCISION INFLAMMATORY MESENTERY ABSCESS WITHIN TRANSVERSE COLON  09-01-2009   dr Zachery Dakins   HERNIA REPAIR  1970's   INGUINAL HERNIA REPAIR Left 09/18/2012   Procedure: LAPAROSCOPIC INGUINAL HERNIA;  Surgeon: Axel Filler, MD;  Location: [facility];  Service: General;  Laterality: Left;   INGUINAL HERNIA REPAIR Left 07/03/2014   Procedure: LEFT INGUINAL HERNIA REPAIR WITH MESH;  Surgeon: Valarie Merino, MD;  Location: [facility];  Service: General;  Laterality: Left;   INSERTION OF MESH Left 09/18/2012   Procedure: INSERTION OF MESH;  Surgeon: Axel Filler, MD;  Location: [facility];  Service: General;  Laterality: Left;   LAPAROSCOPIC  CHOLECYSTECTOMY  11-22-5   dr [name]   LAPAROSCOPIC LYSIS OF ADHESIONS N/A 09/18/2012   Procedure: LAPAROSCOPIC LYSIS OF ADHESIONS;  Surgeon: Axel Filler, MD;  Location: [facility];  Service: General;  Laterality: N/A;   LEFT HEART CATHETERIZATION WITH CORONARY ANGIOGRAM N/A 03/01/2013   Procedure: LEFT HEART CATHETERIZATION WITH CORONARY ANGIOGRAM;  Surgeon: Lennette Bihari, MD;  Location: [facility] CATH LAB;  Service: Cardiovascular;  Laterality: N/A;  PTCA, thrombectomy, and DES stent to right proximal ramus intermediate;  diffuse 30-35% RCA,  diffuse 50% LAD;  preserved LVF w/ moderate anterolateral hypocontractility   LOOP RECORDER INSERTION N/A 03/17/2017    Procedure: LOOP RECORDER INSERTION;  Surgeon: Duke Salvia, MD;  Location: [facility] INVASIVE CV LAB;  Service: Cardiovascular;  Laterality: N/A;   TRANSTHORACIC ECHOCARDIOGRAM  03/24/2015   basal, mid inferor and inferolateral hypokinesis, mild focal basal LVH, ef 45-50%, grade 1 diastolic dysfunction/  mild AV sclerosis without stenosis/  mild AR, MR, and TR/  mild LAE   VENTRAL HERNIA REPAIR N/A 09/18/2012   Procedure: LAPAROSCOPIC VENTRAL HERNIA;  Surgeon: Axel Filler, MD;  Location: [facility];  Service: General;  Laterality: N/A;    Allergies  Allergen Reactions   Penicillins Anaphylaxis    Knots on head; tongue swelling    Social History   Tobacco Use   Smoking status: Never   Smokeless tobacco: Former    Types: Chew   Tobacco comments:    quit 2003  Substance Use Topics   Alcohol use: Yes    Alcohol/week: 1.0 standard drink of alcohol    Types: 1 Glasses of wine per week    Comment: occasionally    Family History  Problem Relation Age of Onset   Cancer Sister        lung   Prior to Admission medications   Medication Sig Start Date End Date Taking? Authorizing Provider  acetaminophen (TYLENOL) 500 MG tablet Take 1,000 mg by mouth every 6 (six) hours as needed for mild pain or headache.   Yes [provider]  amLODipine (NORVASC) 5 MG tablet Take 5 mg by mouth daily.   Yes [provider]  clopidogrel (PLAVIX) 75 MG tablet TAKE 1 TABLET BY MOUTH ONCE DAILY MUST  KEEP  UPCOMING  APPOINTMENT  IN  OCTOBER  2023  WITH  DR  Eden Emms  BEFORE  ANYMORE  REFILLS Patient taking differently: Take 75 mg by mouth every evening. 04/21/22  Yes Wendall Stade, MD  Cyanocobalamin (B-12 PO) Take 1 tablet by mouth daily.   Yes [provider]  glipiZIDE (GLUCOTROL XL) 10 MG 24 hr tablet Take 10 mg by mouth daily with breakfast.   Yes [provider]  JARDIANCE 25 MG TABS tablet Take 25 mg by mouth daily.   Yes [provider]  losartan (COZAAR) 100 MG  tablet Take 100 mg by mouth daily.   Yes [provider]  pantoprazole (PROTONIX) 40 MG tablet Take 40 mg by mouth daily. 01/15/15  Yes [provider]  rosuvastatin (CRESTOR) 10 MG tablet Take 1 tablet (10 mg total) by mouth daily. Patient taking differently: Take 10 mg by mouth every evening. 03/24/22  Yes Wendall Stade, MD  sildenafil (REVATIO) 20 MG tablet Take 20-100 mg by mouth daily as needed (ED).   Yes [provider]  Lancets (ONETOUCH DELICA PLUS LANCET33G) MISC SMARTSIG:1 Topical Daily 01/29/22   [provider]  [facility] VERIO test strip 1 each daily. 03/23/22   [provider]     Review of Systems  Positive ROS: As above  All other systems have been reviewed and were otherwise negative with the exception of those mentioned in the HPI and as above.  Objective: Vital signs in last 24 hours: Temp:  [98 F (36.7 C)] 98 F (36.7 C) (10/17 0939) Pulse Rate:  [55] 55 (10/17 0939) Resp:  [18] 18 (10/17 0939) BP: (162)/(73) 162/73 (10/17 0939) SpO2:  [94 %] 94 % (10/17 0939) Weight:  [79.4 kg] 79.4 kg (10/17 0939) Estimated body mass index is 24.41 kg/m as calculated from the following:   Height as of this encounter: 5\' 11"  (1.803 m).   Weight as of this encounter: 79.4 kg.   General Appearance: Alert Head: Normocephalic, without obvious abnormality, atraumatic Eyes: PERRL, conjunctiva/corneas clear, EOM's intact,    Ears: Normal  Throat: Normal  Neck: Supple, Back: unremarkable Lungs: Clear to auscultation bilaterally, respirations unlabored Heart: Regular rate and rhythm, no murmur, rub or gallop Abdomen: Soft, non-tender Extremities: Extremities normal, atraumatic, no cyanosis or edema Skin: unremarkable  NEUROLOGIC:   Mental status: alert and oriented,Motor Exam - grossly normal Sensory Exam - grossly normal Reflexes:  Coordination - grossly normal Gait - grossly normal Balance - grossly normal Cranial  Nerves: I: smell Not tested  II: visual acuity  OS: Normal  OD: Normal   II: visual fields Full to confrontation  II: pupils Equal, round, reactive to light  III,VII: ptosis None  III,IV,VI: extraocular muscles  Full ROM  V: mastication Normal  V: facial light touch sensation  Normal  V,VII: corneal reflex  Present  VII: facial muscle function - upper  Normal  VII: facial muscle function - lower Normal  VIII: hearing Not tested  IX: soft palate elevation  Normal  IX,X: gag reflex Present  XI: trapezius strength  5/5  XI: sternocleidomastoid strength 5/5  XI: neck flexion strength  5/5  XII: tongue strength  Normal    Data Review Lab Results  Component Value Date   WBC 4.2 03/28/2023   HGB 14.9 03/28/2023   HCT 46.5 03/28/2023   MCV 93.6 03/28/2023   PLT 169 03/28/2023   Lab Results  Component Value Date   NA 139 03/28/2023   K 4.1 03/28/2023   CL 107 03/28/2023   CO2 24 03/28/2023   BUN 15 03/28/2023   CREATININE 0.97 03/28/2023   GLUCOSE 138 (H) 03/28/2023   Lab Results  Component Value Date   INR 1.22 03/02/2013    Assessment/Plan: Lumbar spondylolisthesis, lumbar spinal stenosis, lumbar radiculopathy, neurogenic claudication, lumbago: I have discussed the situation with the patient.  I reviewed his imaging studies with him and pointed out the abnormalities.  We have discussed the various treatment options including surgery.  I have described the surgical treatment option of an L4-5 decompression, instrumentation and fusion and laminotomies at L3-4.  I have shown him surgical models.  I have given him a surgical pamphlet.  We have discussed the risk, benefits, alternatives, expected postop course, and likelihood of achieving our goals with surgery.  I have answered all his questions.  He has decided proceed with surgery.   Cristi Loron 04/07/2023 11:17 AM

## 2023-04-07 NOTE — Transfer of Care (Signed)
Immediate Anesthesia Transfer of Care Note  Patient: Jose Duarte  Procedure(s) Performed: LUMBAR THREE-FOUR LAMINOTOMY WITH LUMBAR FOUR-FIVE POSTERIOR LUMBAR INTERBODY FUSION (Bilateral: Spine Lumbar)  Patient Location: PACU  Anesthesia Type:General  Level of Consciousness: awake and alert   Airway & Oxygen Therapy: Patient Spontanous Breathing and Patient connected to face mask oxygen  Post-op Assessment: Report given to RN and Post -op Vital signs reviewed and stable  Post vital signs: Reviewed and stable  Last Vitals:  Vitals Value Taken Time  BP 127/62 04/07/23 1555  Temp 97.6   Pulse 53 04/07/23 1558  Resp 16 04/07/23 1558  SpO2 98 % 04/07/23 1558  Vitals shown include unfiled device data.  Last Pain:  Vitals:   04/07/23 0952  TempSrc:   PainSc: 0-No pain         Complications: No notable events documented.

## 2023-04-07 NOTE — Op Note (Signed)
Brief history: The patient is an 82 year old white was complained of back and left greater right leg pain consistent with lumbar radiculopathy/neurogenic claudication.  He has failed medical management and was worked up with a lumbar MRI and lumbar x-rays which demonstrated a lumbar spondylolisthesis and spinal stenosis.  I discussed the various treatment options with him.  He has decided to proceed with surgery.  Preoperative diagnosis: Lumbar spondylolisthesis, lumbar facet arthropathy,  spinal stenosis compressing L3, L4 and L5 nerve roots nerve roots; lumbago; lumbar radiculopathy; neurogenic claudication  Postoperative diagnosis: The same  Procedure: Bilateral L3-4 and L4-5 laminotomy/foraminotomies/medial facetectomy to decompress the bilateral L3, L4 and L5 nerve roots(the work required to do this was in addition to the work required to do the posterior lumbar interbody fusion because of the patient's spinal stenosis, facet arthropathy. Etc. requiring a wide decompression of the nerve roots.); left L4-5 transforaminal lumbar interbody fusion with local morselized autograft bone and Zimmer DBM; insertion of interbody prosthesis at L4-5 (globus peek expandable interbody prosthesis); posterior L4-5 instrumentation from L4-5 with globus titanium pedicle screws and rods; posterior lateral arthrodesis at L4-5 with local morselized autograft bone and Zimmer DBM.  Surgeon: Dr. Delma Officer  Asst.: None  Anesthesia: Gen. endotracheal  Estimated blood loss: Cc  Drains: None  Complications: None  Description of procedure: The patient was brought to the operating room by the anesthesia team. General endotracheal anesthesia was induced. The patient was turned to the prone position on the Wilson frame. The patient's lumbosacral region was then prepared with Betadine scrub and Betadine solution. Sterile drapes were applied.  I then injected the area to be incised with Marcaine with epinephrine  solution. I then used the scalpel to make a linear midline incision over the L4-5 interspace. I then used electrocautery to perform a bilateral subperiosteal dissection exposing the spinous process and lamina of L3-4 and L4-5. We then obtained intraoperative radiograph to confirm our location. We then inserted the Verstrac retractor to provide exposure.  I began the decompression by using the high speed drill to perform laminotomies at L3-4 on the left and L4-5 bilaterally. We then used the Kerrison punches to widen the laminotomy and removed the ligamentum flavum at L3-4 on the left.  Performed foraminotomies about the left L4 nerve root.  I then drilled across the midline and performed a right L3-4 laminotomy reaching across the midline removing the ligamentum flavum and performing a foraminotomy about the right L4 nerve root.  I used a Kerrison punches to remove the bilateral L4-5 ligamentum flavum and performed foraminotomies about the bilateral L5 nerve roots completing the decompression.  We now turned our attention to the posterior lumbar interbody fusion. I used a scalpel to incise the intervertebral disc at L4-5 bilaterally. I then performed a partial intervertebral discectomy at L4-5 bilaterally using the pituitary forceps. We prepared the vertebral endplates at L4-5 bilaterally for the fusion by removing the soft tissues with the curettes. We then used the trial spacers to pick the appropriate sized interbody prosthesis. We prefilled his prosthesis with a combination of local morselized autograft bone that we obtained during the decompression as well as Zimmer DBM. We inserted the prefilled prosthesis into the interspace at L4-5 from the left, we then turned and expanded the prosthesis. There was a good snug fit of the prosthesis in the interspace. We then filled and the remainder of the intervertebral disc space with local morselized autograft bone and Zimmer DBM. This completed the posterior lumbar  interbody arthrodesis.  During the decompression and insertion of the prosthesis the assistant protected the thecal sac and nerve roots with the D'Errico retractor.  We now turned attention to the instrumentation. Under fluoroscopic guidance we cannulated the bilateral L4 and L5 pedicles with the bone probe. We then removed the bone probe. We then tapped the pedicle with a 6.5 millimeter tap. We then removed the tap. We probed inside the tapped pedicle with a ball probe to rule out cortical breaches. We then inserted a 7.5 x 50 and 55 millimeter pedicle screw into the L4 and L5 pedicles bilaterally under fluoroscopic guidance. We then palpated along the medial aspect of the pedicles to rule out cortical breaches. There were none. The nerve roots were not injured. We then connected the unilateral pedicle screws with a lordotic rod. We compressed the construct and secured the rod in place with the caps. We then tightened the caps appropriately. This completed the instrumentation from L4-5 bilaterally.  We now turned our attention to the posterior lateral arthrodesis at L4-5. We used the high-speed drill to decorticate the remainder of the facets, pars, transverse process at L4-5. We then applied a combination of local morselized autograft bone and Zimmer DBM over these decorticated posterior lateral structures. This completed the posterior lateral arthrodesis.  We then obtained hemostasis using bipolar electrocautery. We irrigated the wound out with betadine solution. We inspected the thecal sac and nerve roots and noted they were well decompressed. We then removed the retractor.  We injected Exparel . We reapproximated patient's thoracolumbar fascia with interrupted #1 Vicryl suture. We reapproximated patient's subcutaneous tissue with interrupted 2-0 Vicryl suture. The reapproximated patient's skin with Steri-Strips and benzoin. The wound was then coated with bacitracin ointment. A sterile dressing was  applied. The drapes were removed. The patient was subsequently returned to the supine position where they were extubated by the anesthesia team. He was then transported to the post anesthesia care unit in stable condition. All sponge instrument and needle counts were reportedly correct at the end of this case.

## 2023-04-07 NOTE — Progress Notes (Signed)
Orthopedic Tech Progress Note Patient Details:  BARAKA KLATT Sep 14, 1940 [id number]  Left at bedsideOrtho Devices Type of Ortho Device: Lumbar corsett Ortho Device/Splint Interventions: Ordered, Other (comment)      Tonye Pearson 04/07/2023, 6:05 PM

## 2023-04-07 NOTE — Anesthesia Postprocedure Evaluation (Signed)
Anesthesia Post Note  Patient: Pablo Stauffer Dubeau  Procedure(s) Performed: LUMBAR THREE-FOUR LAMINOTOMY WITH LUMBAR FOUR-FIVE POSTERIOR LUMBAR INTERBODY FUSION (Bilateral: Spine Lumbar)     Patient location during evaluation: PACU Anesthesia Type: General Level of consciousness: awake and alert Pain management: pain level controlled Vital Signs Assessment: post-procedure vital signs reviewed and stable Respiratory status: spontaneous breathing, nonlabored ventilation and respiratory function stable Cardiovascular status: blood pressure returned to baseline Postop Assessment: no apparent nausea or vomiting Anesthetic complications: no   No notable events documented.  Last Vitals:  Vitals:   04/07/23 0939 04/07/23 1555  BP: (!) 162/73 127/62  Pulse: (!) 55 (!) 53  Resp: 18 16  Temp: 36.7 C (!) 36.1 C  SpO2: 94% 98%    Last Pain:  Vitals:   04/07/23 0952  TempSrc:   PainSc: 0-No pain                 Shanda Howells

## 2023-04-08 DIAGNOSIS — M4316 Spondylolisthesis, lumbar region: Secondary | ICD-10-CM | POA: Diagnosis not present

## 2023-04-08 LAB — GLUCOSE, CAPILLARY
Glucose-Capillary: 156 mg/dL — ABNORMAL HIGH (ref 70–99)
Glucose-Capillary: 144 mg/dL — ABNORMAL HIGH (ref 70–99)

## 2023-04-08 MED ORDER — ALUM & MAG HYDROXIDE-SIMETH 200-200-20 MG/5ML PO SUSP
15.0000 mL | ORAL | Status: DC | PRN
  Administered 2023-04-08: 15 mL via ORAL
  Filled 2023-04-08: qty 30

## 2023-04-08 MED ORDER — TAMSULOSIN HCL 0.4 MG PO CAPS: 0.4000 mg | ORAL_CAPSULE | Freq: Every day | ORAL | 0 refills | Status: DC

## 2023-04-08 MED ORDER — DOCUSATE SODIUM 100 MG PO CAPS: 100.0000 mg | ORAL_CAPSULE | Freq: Two times a day (BID) | ORAL | 0 refills | Status: DC

## 2023-04-08 MED ORDER — GLIPIZIDE ER 10 MG PO TB24
10.0000 mg | ORAL_TABLET | Freq: Every day | ORAL | Status: DC
  Administered 2023-04-08: 10 mg via ORAL
  Filled 2023-04-08: qty 1

## 2023-04-08 MED ORDER — CYCLOBENZAPRINE HCL 5 MG PO TABS: 5.0000 mg | ORAL_TABLET | Freq: Three times a day (TID) | ORAL | Status: DC | PRN

## 2023-04-08 MED ORDER — CYCLOBENZAPRINE HCL 5 MG PO TABS: 5.0000 mg | ORAL_TABLET | Freq: Three times a day (TID) | ORAL | 0 refills | Status: DC | PRN

## 2023-04-08 MED ORDER — OXYCODONE-ACETAMINOPHEN 5-325 MG PO TABS
1.0000 | ORAL_TABLET | ORAL | Status: DC | PRN
  Administered 2023-04-08: 1 via ORAL
  Filled 2023-04-08: qty 1

## 2023-04-08 MED ORDER — CLOPIDOGREL BISULFATE 75 MG PO TABS: 75.0000 mg | ORAL_TABLET | Freq: Every evening | ORAL | Status: DC

## 2023-04-08 MED ORDER — OXYCODONE-ACETAMINOPHEN 5-325 MG PO TABS: 1.0000 | ORAL_TABLET | Freq: Four times a day (QID) | ORAL | 0 refills | Status: DC | PRN

## 2023-04-08 NOTE — Progress Notes (Signed)
Subjective: The patient is alert and pleasant.  He looks well.  His wife is at the bedside.  His back is appropriately sore.  He has not urinated yet.  The Foley was recently removed.  Objective: Vital signs in last 24 hours: Temp:  [96.9 F (36.1 C)-98.2 F (36.8 C)] 98.2 F (36.8 C) (10/18 0318) Pulse Rate:  [49-66] 66 (10/18 0318) Resp:  [14-24] 20 (10/18 0318) BP: (122-178)/(62-84) 140/80 (10/18 0318) SpO2:  [94 %-99 %] 96 % (10/18 0318) Weight:  [79.4 kg] 79.4 kg (10/17 0939) Estimated body mass index is 24.41 kg/m as calculated from the following:   Height as of this encounter: 5\' 11"  (1.803 m).   Weight as of this encounter: 79.4 kg.   Intake/Output from previous day: 10/17 0701 - 10/18 0700 In: 1480 [P.O.:480; I.V.:1000] Out: 1800 [Urine:1650; Blood:150] Intake/Output this shift: Total I/O In: 480 [P.O.:480] Out: 1100 [Urine:1100]  Physical exam the patient is alert and oriented.  He looks well.  His strength is normal.  Lab Results: No results for input(s): "WBC", "HGB", "HCT", "PLT" in the last 72 hours. BMET No results for input(s): "NA", "K", "CL", "CO2", "GLUCOSE", "BUN", "CREATININE", "CALCIUM" in the last 72 hours.  Studies/Results: DG Lumbar Spine 2-3 Views  Result Date: 04/07/2023 CLINICAL DATA:  Elective surgery. EXAM: LUMBAR SPINE - 2-3 VIEW COMPARISON:  None Available. FINDINGS: Two fluoroscopic spot views of the lumbar spine obtained in the operating room. Pedicle screws at L4 and L5 with interbody spacer. Fluoroscopy time 10 seconds. Dose 9.24 mGy. IMPRESSION: Intraoperative fluoroscopy during L4-5 fusion. Electronically Signed   By: Narda Rutherford M.D.   On: 04/07/2023 15:46   DG Lumbar Spine 1 View  Result Date: 04/07/2023 CLINICAL DATA:  Elective surgery, intra op for localization. EXAM: LUMBAR SPINE - 1 VIEW COMPARISON:  10/29/2022. FINDINGS: Portable lateral view of the lumbar spine obtained in the operating room. Surgical instrument localizes  posteriorly at the L4 level. IMPRESSION: Surgical instrument localizes posteriorly at the L4 level. Electronically Signed   By: Narda Rutherford M.D.   On: 04/07/2023 15:45   DG C-Arm 1-60 Min-No Report  Result Date: 04/07/2023 Fluoroscopy was utilized by the requesting physician.  No radiographic interpretation.    Assessment/Plan: Postop day #1: The patient is doing well.  We will likely discharge him if he can urinate.  I gave the patient, and his wife, discharge instructions and answered all their questions.  LOS: 0 days     Cristi Loron 04/08/2023, 6:37 AM     Patient ID: Jose Duarte, male   DOB: 07-Apr-1941, 82 y.o.   MRN: [id number]

## 2023-04-08 NOTE — Evaluation (Signed)
Physical Therapy Evaluation  Patient Details Name: Jose Duarte MRN: [id number] DOB: 1941-03-07 Today's Date: 04/08/2023  History of Present Illness  Pt is an 82 y/o M who presents s/p L4-L5 TLIF on 04/07/2023.  PMH significant for DM II, HTN, CVA.  Clinical Impression  Pt admitted with above diagnosis. At the time of PT eval, pt was able to demonstrate transfers and ambulation with gross min-mod assist and RW for support. Pt was educated on precautions, brace application/wearing schedule, appropriate activity progression, and car transfer. Pt currently with functional limitations due to the deficits listed below (see PT Problem List). Pt will benefit from skilled PT to increase their independence and safety with mobility to allow discharge to the venue listed below.          If plan is discharge home, recommend the following: A little help with walking and/or transfers;A little help with bathing/dressing/bathroom;Assistance with cooking/housework;Help with stairs or ramp for entrance;Assist for transportation   Can travel by private vehicle        Equipment Recommendations None recommended by PT  Recommendations for Other Services       Functional Status Assessment Patient has had a recent decline in their functional status and demonstrates the ability to make significant improvements in function in a reasonable and predictable amount of time.     Precautions / Restrictions Precautions Precautions: Back;Fall Precaution Booklet Issued: Yes (comment) Precaution Comments: Reviewed handout and pt was cued for precautions during functional mobility. Required Braces or Orthoses: Spinal Brace Spinal Brace: Lumbar corset;Applied in sitting position (Prefer donning this model brace in standing however pt unable to maintain standing balance to don without assist on eval.) Restrictions Weight Bearing Restrictions: No      Mobility  Bed Mobility Overal bed mobility: Needs  Assistance Bed Mobility: Sidelying to Sit, Sit to Sidelying, Rolling Rolling: Min assist Sidelying to sit: Min assist     Sit to sidelying: Min assist General bed mobility comments: Assist and VC's for optimal log roll technique. HOB flat and rails lowered to simulate home environment.    Transfers Overall transfer level: Needs assistance Equipment used: Rolling walker (2 wheels) Transfers: Sit to/from Stand Sit to Stand: Min assist, Mod assist           General transfer comment: Assist for power up to full stand. Increased time and assist required for pt to gain and maintain standing balance due to posterior lean.    Ambulation/Gait Ambulation/Gait assistance: Min assist Gait Distance (Feet): 100 Feet Assistive device: Rolling walker (2 wheels) Gait Pattern/deviations: Decreased stride length, Shuffle, Trunk flexed Gait velocity: Decreased Gait velocity interpretation: <1.31 ft/sec, indicative of household ambulator   General Gait Details: VC's for improved posture, closer walker proximity and forward gaze. Pt with difficulty achieving heel strike and noted floor clearance decreased as pt fatigued.  Stairs Stairs: Yes Stairs assistance: Mod assist, +2 safety/equipment Stair Management: One rail Right, Step to pattern, Forwards Number of Stairs: 5 General stair comments: HHA provided on the L with railing on the R. Pt with increased effort to advance up to next step. Stair training ended by therapist for safety after 5 steps. Pt has a flight to access his usual bedroom and bathroom. We discussed pt sleeping on main level for now.  Wheelchair Mobility     Tilt Bed    Modified Rankin (Stroke Patients Only)       Balance Overall balance assessment: Needs assistance Sitting-balance support: Feet supported Sitting balance-Leahy Scale: Good  Standing balance support: Reliant on assistive device for balance, Bilateral upper extremity supported Standing  balance-Leahy Scale: Poor                               Pertinent Vitals/Pain Pain Assessment Pain Assessment: Faces Faces Pain Scale: Hurts little more Pain Location: Incisional Pain Descriptors / Indicators: Aching, Tender, Grimacing Pain Intervention(s): Limited activity within patient's tolerance, Monitored during session, Repositioned    Home Living Family/patient expects to be discharged to:: Private residence Living Arrangements: Alone Available Help at Discharge: Family Type of Home: House Home Access: Stairs to enter Entrance Stairs-Rails: None Entrance Stairs-Number of Steps: 2 Alternate Level Stairs-Number of Steps: flight Home Layout: Multi-level;Able to live on main level with bedroom/bathroom Home Equipment: Rolling Walker (2 wheels);Shower seat;Grab bars - tub/shower;Adaptive equipment;Rollator (4 wheels)      Prior Function Prior Level of Function : Independent/Modified Independent;Driving             Mobility Comments: Using the rollator PTA       Extremity/Trunk Assessment   Upper Extremity Assessment Upper Extremity Assessment: Defer to OT evaluation    Lower Extremity Assessment Lower Extremity Assessment: RLE deficits/detail;LLE deficits/detail RLE Deficits / Details: Bilaterally - decreased strength with quick muscular fatigue.    Cervical / Trunk Assessment Cervical / Trunk Assessment: Back Surgery  Communication   Communication Communication: No apparent difficulties Cueing Techniques: Verbal cues;Gestural cues  Cognition Arousal: Alert Behavior During Therapy: WFL for tasks assessed/performed Overall Cognitive Status: Within Functional Limits for tasks assessed                                 General Comments: Decreased awareness of deficits.        General Comments      Exercises     Assessment/Plan    PT Assessment Patient needs continued PT services  PT Problem List Decreased  strength;Decreased activity tolerance;Decreased balance;Decreased mobility;Decreased knowledge of use of DME;Decreased safety awareness;Decreased knowledge of precautions;Pain       PT Treatment Interventions DME instruction;Gait training;Stair training;Functional mobility training;Therapeutic activities;Therapeutic exercise;Balance training;Patient/family education    PT Goals (Current goals can be found in the Care Plan section)  Acute Rehab PT Goals Patient Stated Goal: Return home at d/c, be able to go up his stairs to his bedroom PT Goal Formulation: With patient Time For Goal Achievement: 04/15/23 Potential to Achieve Goals: Good    Frequency Min 5X/week     Co-evaluation               AM-PAC PT "6 Clicks" Mobility  Outcome Measure Help needed turning from your back to your side while in a flat bed without using bedrails?: A Little Help needed moving from lying on your back to sitting on the side of a flat bed without using bedrails?: A Little Help needed moving to and from a bed to a chair (including a wheelchair)?: A Lot Help needed standing up from a chair using your arms (e.g., wheelchair or bedside chair)?: A Lot Help needed to walk in hospital room?: A Little Help needed climbing 3-5 steps with a railing? : A Lot (cues and assist) 6 Click Score: 15    End of Session Equipment Utilized During Treatment: Gait belt;Back brace Activity Tolerance: Patient tolerated treatment well Patient left: in bed;with call bell/phone within reach Nurse Communication: Mobility status PT Visit Diagnosis:  Unsteadiness on feet (R26.81);Pain Pain - part of body:  (back)    Time: 0347-4259 PT Time Calculation (min) (ACUTE ONLY): 45 min   Charges:   PT Evaluation $PT Eval Low Complexity: 1 Low PT Treatments $Gait Training: 23-37 mins PT General Charges $$ ACUTE PT VISIT: 1 Visit         Conni Slipper, PT, DPT Acute Rehabilitation Services Secure Chat Preferred Office:  [phone]   Jose Duarte 04/08/2023, 2:22 PM

## 2023-04-08 NOTE — Evaluation (Signed)
Occupational Therapy Evaluation Patient Details Name: Jose Duarte MRN: [id number] DOB: 09/09/1940 Today's Date: 04/08/2023   History of Present Illness 82 yo M s/p PLIF.  PMH includes: DM II, HTN, CVA, GERD   Clinical Impression   Patient admitted for the diagnosis above.  PTA he lives at home alone, but has a SO who can provide supportive assist, but not physical assist.  Patient is presenting with poor static and dynamic balance, but is currently saying he does not want HH PT.  Patient would benefit from [facility] PT to improve lower body strength and balance, which is turn would increase independence with lower body ADL, particularly when he needs to remove his hands from the RW.  No HH OT is anticipated, as his deficits surround balance and lower body strength.  OT will follow the patient if he remains in the acute setting to address deficits.  Back precautions reviewed with fair understanding, continues to need verbal cues for technique with log roll and sit to stands.  Recommend follow up as prescribed by MD.        If plan is discharge home, recommend the following: Assist for transportation;A little help with bathing/dressing/bathroom;A little help with walking and/or transfers;Help with stairs or ramp for entrance;Assistance with cooking/housework    Functional Status Assessment  Patient has had a recent decline in their functional status and demonstrates the ability to make significant improvements in function in a reasonable and predictable amount of time.  Equipment Recommendations  None recommended by OT    Recommendations for Other Services       Precautions / Restrictions Precautions Precautions: Back Precaution Booklet Issued: No Precaution Comments: Reviewed Required Braces or Orthoses: Spinal Brace Spinal Brace: Lumbar corset Restrictions Weight Bearing Restrictions: No      Mobility Bed Mobility Overal bed mobility: Needs Assistance Bed Mobility: Sidelying to  Sit, Sit to Sidelying   Sidelying to sit: Min assist     Sit to sidelying: Min assist General bed mobility comments: cues for technique    Transfers Overall transfer level: Needs assistance Equipment used: Rolling walker (2 wheels) Transfers: Sit to/from Stand, Bed to chair/wheelchair/BSC Sit to Stand: Min assist     Step pivot transfers: Min assist     General transfer comment: balance assist      Balance Overall balance assessment: Needs assistance Sitting-balance support: Feet supported Sitting balance-Leahy Scale: Good     Standing balance support: Reliant on assistive device for balance, Bilateral upper extremity supported Standing balance-Leahy Scale: Poor Standing balance comment: balance back, unable to perform pant management without Mod A for balance                           ADL either performed or assessed with clinical judgement   ADL       Grooming: Wash/dry hands;Contact guard assist;Standing               Lower Body Dressing: Minimal assistance;Sit to/from stand;Moderate assistance Lower Body Dressing Details (indicate cue type and reason): balance assist Toilet Transfer: Contact guard assist;Minimal assistance;Ambulation;Rolling walker (2 wheels) Toilet Transfer Details (indicate cue type and reason): Balance assist                 Vision Patient Visual Report: No change from baseline       Perception Perception: Within Functional Limits       Praxis Praxis: [facility]       Pertinent Vitals/Pain Pain  Assessment Pain Assessment: Faces Faces Pain Scale: Hurts little more Pain Location: Incisional Pain Descriptors / Indicators: Aching, Tender, Grimacing Pain Intervention(s): Monitored during session     Extremity/Trunk Assessment Upper Extremity Assessment Upper Extremity Assessment: Overall WFL for tasks assessed   Lower Extremity Assessment Lower Extremity Assessment: Defer to PT evaluation   Cervical / Trunk  Assessment Cervical / Trunk Assessment: Back Surgery   Communication Communication Communication: No apparent difficulties   Cognition Arousal: Alert Behavior During Therapy: WFL for tasks assessed/performed Overall Cognitive Status: Within Functional Limits for tasks assessed                                 General Comments: Decreased awareness of deficits.  Poor balance, but is declining HH PT     General Comments       Exercises     Shoulder Instructions      Home Living Family/patient expects to be discharged to:: Private residence Living Arrangements: Alone Available Help at Discharge: Family Type of Home: House Home Access: Stairs to enter [employer] of Steps: 2 Entrance Stairs-Rails: None Home Layout: Multi-level;Able to live on main level with bedroom/bathroom     Bathroom Shower/Tub: Producer, television/film/video: Standard Bathroom Accessibility: Yes How Accessible: Accessible via walker Home Equipment: Rolling Walker (2 wheels);Shower seat;Grab bars - tub/shower;Adaptive equipment Adaptive Equipment: Reacher        Prior Functioning/Environment Prior Level of Function : Independent/Modified Independent;Driving                        OT Problem List: Decreased strength;Decreased activity tolerance;Decreased safety awareness;Impaired balance (sitting and/or standing);Pain      OT Treatment/Interventions: Self-care/ADL training;Neuromuscular education;Therapeutic activities;Balance training    OT Goals(Current goals can be found in the care plan section) Acute Rehab OT Goals Patient Stated Goal: Return home OT Goal Formulation: With patient Time For Goal Achievement: 04/22/23 Potential to Achieve Goals: Good ADL Goals Pt Will Perform Lower Body Dressing: with modified independence;sit to/from stand Pt Will Transfer to Toilet: with modified independence;regular height toilet;ambulating  OT Frequency: Min 1X/week     Co-evaluation              AM-PAC OT "6 Clicks" Daily Activity     Outcome Measure Help from another person eating meals?: None Help from another person taking care of personal grooming?: A Little Help from another person toileting, which includes using toliet, bedpan, or urinal?: A Little Help from another person bathing (including washing, rinsing, drying)?: A Little Help from another person to put on and taking off regular upper body clothing?: None Help from another person to put on and taking off regular lower body clothing?: A Little 6 Click Score: 20   End of Session Equipment Utilized During Treatment: Gait belt;Rolling walker (2 wheels) Nurse Communication: Mobility status  Activity Tolerance: Patient tolerated treatment well Patient left: in bed;with call bell/phone within reach;with family/visitor present  OT Visit Diagnosis: Unsteadiness on feet (R26.81)                Time: 1610-9604 OT Time Calculation (min): 22 min Charges:  OT General Charges $OT Visit: 1 Visit OT Evaluation $OT Eval Moderate Complexity: 1 Mod  04/08/2023  RP, OTR/L  Acute Rehabilitation Services  Office:  [phone]   Suzanna Obey 04/08/2023, 12:29 PM

## 2023-04-08 NOTE — Discharge Summary (Signed)
Physician Discharge Summary     Providing Compassionate, Quality Care - Together   Patient ID: Jose Duarte MRN: [id number] DOB/AGE: 1940/09/21 82 y.o.  Admit date: 04/07/2023 Discharge date: 04/08/2023  Admission Diagnoses:  Spondylolisthesis of lumbar region  Discharge Diagnoses:  Principal Problem:   Spondylolisthesis of lumbar region   Discharged Condition: good  Hospital Course: Patient underwent an L4-5 fusion by Dr. Lovell Sheehan on 04/07/2023. He was admitted to 3C10 following recovery from anesthesia in the PACU. His postoperative course has been uncomplicated. He has worked with both physical and occupational therapies who feel the patient is ready for discharge home. He is ambulating independently and without difficulty. He is tolerating a normal diet. He is not having any bowel or bladder dysfunction. His pain is well-controlled with oral pain medication. He is ready for discharge home with home health PT and OT.   Consults: PT/OT/TOC  Significant Diagnostic Studies: radiology: DG Lumbar Spine 2-3 Views  Result Date: 04/07/2023 CLINICAL DATA:  Elective surgery. EXAM: LUMBAR SPINE - 2-3 VIEW COMPARISON:  None Available. FINDINGS: Two fluoroscopic spot views of the lumbar spine obtained in the operating room. Pedicle screws at L4 and L5 with interbody spacer. Fluoroscopy time 10 seconds. Dose 9.24 mGy. IMPRESSION: Intraoperative fluoroscopy during L4-5 fusion. Electronically Signed   By: Narda Rutherford M.D.   On: 04/07/2023 15:46   DG Lumbar Spine 1 View  Result Date: 04/07/2023 CLINICAL DATA:  Elective surgery, intra op for localization. EXAM: LUMBAR SPINE - 1 VIEW COMPARISON:  10/29/2022. FINDINGS: Portable lateral view of the lumbar spine obtained in the operating room. Surgical instrument localizes posteriorly at the L4 level. IMPRESSION: Surgical instrument localizes posteriorly at the L4 level. Electronically Signed   By: Narda Rutherford M.D.   On: 04/07/2023  15:45   DG C-Arm 1-60 Min-No Report  Result Date: 04/07/2023 Fluoroscopy was utilized by the requesting physician.  No radiographic interpretation.     Treatments: surgery: Bilateral L3-4 and L4-5 laminotomy/foraminotomies/medial facetectomy to decompress the bilateral L3, L4 and L5 nerve roots(the work required to do this was in addition to the work required to do the posterior lumbar interbody fusion because of the patient's spinal stenosis, facet arthropathy. Etc. requiring a wide decompression of the nerve roots.); left L4-5 transforaminal lumbar interbody fusion with local morselized autograft bone and Zimmer DBM; insertion of interbody prosthesis at L4-5 (globus peek expandable interbody prosthesis); posterior L4-5 instrumentation from L4-5 with globus titanium pedicle screws and rods; posterior lateral arthrodesis at L4-5 with local morselized autograft bone and Zimmer DBM.   Discharge Exam: Blood pressure (!) 111/56, pulse 61, temperature 99 F (37.2 C), temperature source Oral, resp. rate 18, height 5\' 11"  (1.803 m), weight 79.4 kg, SpO2 94%.  Per report: Alert and oriented x 4 PERRLA CN II-XII grossly intact MAE, Strength and sensation intact Incision is covered with Honeycomb dressing and Steri Strips; Dressing is clean dry and intact, with a scant amount of dried sanguinous drainage   Disposition: Discharge disposition: 06-Home-Health Care Svc       Discharge Instructions     Call MD for:  difficulty breathing, headache or visual disturbances   Complete by: As directed    Call MD for:  hives   Complete by: As directed    Call MD for:  persistant dizziness or light-headedness   Complete by: As directed    Call MD for:  persistant nausea and vomiting   Complete by: As directed    Call MD for:  redness,  tenderness, or signs of infection (pain, swelling, redness, odor or green/yellow discharge around incision site)   Complete by: As directed    Call MD for:  severe  uncontrolled pain   Complete by: As directed    Diet - low sodium heart healthy   Complete by: As directed    If the dressing is still on your incision site when you go home, remove it on the third day after your surgery date. Remove dressing if it begins to fall off, or if it is dirty or damaged before the third day.   Complete by: As directed    Increase activity slowly   Complete by: As directed       Allergies as of 04/08/2023       Reactions   Penicillins Anaphylaxis   Knots on head; tongue swelling        Medication List     TAKE these medications    acetaminophen 500 MG tablet Commonly known as: TYLENOL Take 1,000 mg by mouth every 6 (six) hours as needed for mild pain or headache.   amLODipine 5 MG tablet Commonly known as: NORVASC Take 5 mg by mouth daily.   B-12 PO Take 1 tablet by mouth daily.   clopidogrel 75 MG tablet Commonly known as: PLAVIX Take 1 tablet (75 mg total) by mouth every evening. **Restart on 04/12/2023** What changed: See the new instructions.   cyclobenzaprine 5 MG tablet Commonly known as: FLEXERIL Take 1 tablet (5 mg total) by mouth 3 (three) times daily as needed for muscle spasms.   docusate sodium 100 MG capsule Commonly known as: COLACE Take 1 capsule (100 mg total) by mouth 2 (two) times daily.   glipiZIDE 10 MG 24 hr tablet Commonly known as: GLUCOTROL XL Take 10 mg by mouth daily with breakfast.   Jardiance 25 MG Tabs tablet Generic drug: empagliflozin Take 25 mg by mouth daily.   losartan 100 MG tablet Commonly known as: COZAAR Take 100 mg by mouth daily.   OneTouch Delica Plus Lancet33G Misc SMARTSIG:1 Topical Daily   OneTouch Verio test strip Generic drug: glucose blood 1 each daily.   oxyCODONE-acetaminophen 5-325 MG tablet Commonly known as: PERCOCET/ROXICET Take 1-2 tablets by mouth every 6 (six) hours as needed for moderate pain (pain score 4-6).   pantoprazole 40 MG tablet Commonly known as:  PROTONIX Take 40 mg by mouth daily.   rosuvastatin 10 MG tablet Commonly known as: Crestor Take 1 tablet (10 mg total) by mouth daily. What changed: when to take this   sildenafil 20 MG tablet Commonly known as: REVATIO Take 20-100 mg by mouth daily as needed (ED).   tamsulosin 0.4 MG Caps capsule Commonly known as: FLOMAX Take 1 capsule (0.4 mg total) by mouth daily. Start taking on: April 09, 2023               Discharge Care Instructions  (From admission, onward)           Start     Ordered   04/08/23 0000  If the dressing is still on your incision site when you go home, remove it on the third day after your surgery date. Remove dressing if it begins to fall off, or if it is dirty or damaged before the third day.        04/08/23 1249            Follow-up Information     Tressie Stalker, MD. Go on 05/03/2023.  Specialty: Neurosurgery Why: First post op appointment is on 05/03/2023 at 8:30 am. Contact information: [address] [place] [address] [phone]                 Signed: Val Eagle, DNP, AGNP-C Nurse Practitioner  [facility] [address], [address], [place], [place] [address] P: [phone]    F: [phone]  04/08/2023, 12:51 PM

## 2023-04-08 NOTE — Discharge Instructions (Signed)
**  Restart Plavix on 04/12/2023**  Wound Care Keep incision covered and dry until post op day 3. You may remove the Honeycomb dressing on post op day 3. Leave steri-strips on back.  They will fall off by themselves. Do not put any creams, lotions, or ointments on incision. You are fine to shower. Let water run over incision and pat dry.  Activity Walk each and every day, increasing distance each day. No lifting greater than 5 lbs.  Avoid excessive back motion. No driving for 2 weeks; may ride as a passenger locally.  Diet Resume your normal diet.  Call Your Doctor If Any of These Occur Redness, drainage, or swelling at the wound.  Temperature greater than 101 degrees. Severe pain not relieved by pain medication. Incision starts to come apart.  Follow Up Appt Call [phone] today for appointment in 2-3 weeks if you don't already have one or for any problems.  If you have any hardware placed in your spine, you will need an x-ray before your appointment.

## 2023-04-08 NOTE — Progress Notes (Signed)
Patient alert and oriented, void, ambulate, surgical site clean and dry no sign of infection. D/c instructions explain and given all questions answered. Patient d/c home per order.

## 2023-04-08 NOTE — Plan of Care (Signed)
[facility] Tonsillectomy/Adenoidectomy, Postoperative PEDS care plan entered in error.

## 2023-04-10 ENCOUNTER — Other Ambulatory Visit: Payer: Self-pay | Admitting: Cardiovascular Disease

## 2023-04-12 DIAGNOSIS — M4726 Other spondylosis with radiculopathy, lumbar region: Secondary | ICD-10-CM | POA: Diagnosis not present

## 2023-04-12 DIAGNOSIS — I2510 Atherosclerotic heart disease of native coronary artery without angina pectoris: Secondary | ICD-10-CM | POA: Diagnosis not present

## 2023-04-12 DIAGNOSIS — M48062 Spinal stenosis, lumbar region with neurogenic claudication: Secondary | ICD-10-CM | POA: Diagnosis not present

## 2023-04-12 DIAGNOSIS — Z981 Arthrodesis status: Secondary | ICD-10-CM | POA: Diagnosis not present

## 2023-04-12 DIAGNOSIS — Z8673 Personal history of transient ischemic attack (TIA), and cerebral infarction without residual deficits: Secondary | ICD-10-CM | POA: Diagnosis not present

## 2023-04-12 DIAGNOSIS — Z7902 Long term (current) use of antithrombotics/antiplatelets: Secondary | ICD-10-CM | POA: Diagnosis not present

## 2023-04-12 DIAGNOSIS — I255 Ischemic cardiomyopathy: Secondary | ICD-10-CM | POA: Diagnosis not present

## 2023-04-12 DIAGNOSIS — K5730 Diverticulosis of large intestine without perforation or abscess without bleeding: Secondary | ICD-10-CM | POA: Diagnosis not present

## 2023-04-12 DIAGNOSIS — Z7984 Long term (current) use of oral hypoglycemic drugs: Secondary | ICD-10-CM | POA: Diagnosis not present

## 2023-04-12 DIAGNOSIS — G8929 Other chronic pain: Secondary | ICD-10-CM | POA: Diagnosis not present

## 2023-04-12 DIAGNOSIS — E7800 Pure hypercholesterolemia, unspecified: Secondary | ICD-10-CM | POA: Diagnosis not present

## 2023-04-12 DIAGNOSIS — I252 Old myocardial infarction: Secondary | ICD-10-CM | POA: Diagnosis not present

## 2023-04-12 DIAGNOSIS — K219 Gastro-esophageal reflux disease without esophagitis: Secondary | ICD-10-CM | POA: Diagnosis not present

## 2023-04-12 DIAGNOSIS — Z860101 Personal history of adenomatous and serrated colon polyps: Secondary | ICD-10-CM | POA: Diagnosis not present

## 2023-04-12 DIAGNOSIS — I10 Essential (primary) hypertension: Secondary | ICD-10-CM | POA: Diagnosis not present

## 2023-04-12 DIAGNOSIS — M4316 Spondylolisthesis, lumbar region: Secondary | ICD-10-CM | POA: Diagnosis not present

## 2023-04-12 DIAGNOSIS — Z87442 Personal history of urinary calculi: Secondary | ICD-10-CM | POA: Diagnosis not present

## 2023-04-12 DIAGNOSIS — M4808 Spinal stenosis, sacral and sacrococcygeal region: Secondary | ICD-10-CM | POA: Diagnosis not present

## 2023-04-12 DIAGNOSIS — Z4789 Encounter for other orthopedic aftercare: Secondary | ICD-10-CM | POA: Diagnosis not present

## 2023-04-12 DIAGNOSIS — G4733 Obstructive sleep apnea (adult) (pediatric): Secondary | ICD-10-CM | POA: Diagnosis not present

## 2023-04-12 DIAGNOSIS — E1142 Type 2 diabetes mellitus with diabetic polyneuropathy: Secondary | ICD-10-CM | POA: Diagnosis not present

## 2023-04-12 DIAGNOSIS — M1990 Unspecified osteoarthritis, unspecified site: Secondary | ICD-10-CM | POA: Diagnosis not present

## 2023-04-12 DIAGNOSIS — I4510 Unspecified right bundle-branch block: Secondary | ICD-10-CM | POA: Diagnosis not present

## 2023-04-12 DIAGNOSIS — I951 Orthostatic hypotension: Secondary | ICD-10-CM | POA: Diagnosis not present

## 2023-04-12 DIAGNOSIS — F1090 Alcohol use, unspecified, uncomplicated: Secondary | ICD-10-CM | POA: Diagnosis not present

## 2023-04-14 DIAGNOSIS — M4726 Other spondylosis with radiculopathy, lumbar region: Secondary | ICD-10-CM | POA: Diagnosis not present

## 2023-04-14 DIAGNOSIS — M4808 Spinal stenosis, sacral and sacrococcygeal region: Secondary | ICD-10-CM | POA: Diagnosis not present

## 2023-04-14 DIAGNOSIS — F1090 Alcohol use, unspecified, uncomplicated: Secondary | ICD-10-CM | POA: Diagnosis not present

## 2023-04-14 DIAGNOSIS — E1142 Type 2 diabetes mellitus with diabetic polyneuropathy: Secondary | ICD-10-CM | POA: Diagnosis not present

## 2023-04-14 DIAGNOSIS — I4510 Unspecified right bundle-branch block: Secondary | ICD-10-CM | POA: Diagnosis not present

## 2023-04-14 DIAGNOSIS — K5730 Diverticulosis of large intestine without perforation or abscess without bleeding: Secondary | ICD-10-CM | POA: Diagnosis not present

## 2023-04-14 DIAGNOSIS — I255 Ischemic cardiomyopathy: Secondary | ICD-10-CM | POA: Diagnosis not present

## 2023-04-14 DIAGNOSIS — Z4789 Encounter for other orthopedic aftercare: Secondary | ICD-10-CM | POA: Diagnosis not present

## 2023-04-14 DIAGNOSIS — M48062 Spinal stenosis, lumbar region with neurogenic claudication: Secondary | ICD-10-CM | POA: Diagnosis not present

## 2023-04-14 DIAGNOSIS — Z981 Arthrodesis status: Secondary | ICD-10-CM | POA: Diagnosis not present

## 2023-04-14 DIAGNOSIS — M4316 Spondylolisthesis, lumbar region: Secondary | ICD-10-CM | POA: Diagnosis not present

## 2023-04-14 DIAGNOSIS — I252 Old myocardial infarction: Secondary | ICD-10-CM | POA: Diagnosis not present

## 2023-04-14 DIAGNOSIS — I10 Essential (primary) hypertension: Secondary | ICD-10-CM | POA: Diagnosis not present

## 2023-04-14 DIAGNOSIS — I951 Orthostatic hypotension: Secondary | ICD-10-CM | POA: Diagnosis not present

## 2023-04-14 DIAGNOSIS — Z87442 Personal history of urinary calculi: Secondary | ICD-10-CM | POA: Diagnosis not present

## 2023-04-14 DIAGNOSIS — G8929 Other chronic pain: Secondary | ICD-10-CM | POA: Diagnosis not present

## 2023-04-14 DIAGNOSIS — M1990 Unspecified osteoarthritis, unspecified site: Secondary | ICD-10-CM | POA: Diagnosis not present

## 2023-04-14 DIAGNOSIS — Z860101 Personal history of adenomatous and serrated colon polyps: Secondary | ICD-10-CM | POA: Diagnosis not present

## 2023-04-14 DIAGNOSIS — K219 Gastro-esophageal reflux disease without esophagitis: Secondary | ICD-10-CM | POA: Diagnosis not present

## 2023-04-14 DIAGNOSIS — Z7902 Long term (current) use of antithrombotics/antiplatelets: Secondary | ICD-10-CM | POA: Diagnosis not present

## 2023-04-14 DIAGNOSIS — Z8673 Personal history of transient ischemic attack (TIA), and cerebral infarction without residual deficits: Secondary | ICD-10-CM | POA: Diagnosis not present

## 2023-04-14 DIAGNOSIS — I2510 Atherosclerotic heart disease of native coronary artery without angina pectoris: Secondary | ICD-10-CM | POA: Diagnosis not present

## 2023-04-14 DIAGNOSIS — Z7984 Long term (current) use of oral hypoglycemic drugs: Secondary | ICD-10-CM | POA: Diagnosis not present

## 2023-04-14 DIAGNOSIS — G4733 Obstructive sleep apnea (adult) (pediatric): Secondary | ICD-10-CM | POA: Diagnosis not present

## 2023-04-14 DIAGNOSIS — E7800 Pure hypercholesterolemia, unspecified: Secondary | ICD-10-CM | POA: Diagnosis not present

## 2023-04-14 MED FILL — Heparin Sodium (Porcine) Inj 1000 Unit/ML: INTRAMUSCULAR | Qty: 30 | Status: AC

## 2023-04-14 MED FILL — Sodium Chloride IV Soln 0.9%: INTRAVENOUS | Qty: 1000 | Status: AC

## 2023-05-03 DIAGNOSIS — M4316 Spondylolisthesis, lumbar region: Secondary | ICD-10-CM | POA: Diagnosis not present

## 2023-05-17 ENCOUNTER — Other Ambulatory Visit: Payer: Self-pay | Admitting: Cardiovascular Disease

## 2023-05-27 ENCOUNTER — Ambulatory Visit: Payer: Medicare HMO | Admitting: Cardiology

## 2023-06-01 ENCOUNTER — Encounter (HOSPITAL_COMMUNITY): Payer: Self-pay

## 2023-06-01 ENCOUNTER — Other Ambulatory Visit: Payer: Self-pay

## 2023-06-01 ENCOUNTER — Emergency Department (HOSPITAL_COMMUNITY)
Admission: EM | Admit: 2023-06-01 | Discharge: 2023-06-02 | Disposition: A | Discharge status: Home / Self Care | Payer: Medicare HMO | Attending: Emergency Medicine | Admitting: Emergency Medicine

## 2023-06-01 DIAGNOSIS — Z7902 Long term (current) use of antithrombotics/antiplatelets: Secondary | ICD-10-CM | POA: Insufficient documentation

## 2023-06-01 DIAGNOSIS — K5903 Drug induced constipation: Secondary | ICD-10-CM | POA: Insufficient documentation

## 2023-06-01 DIAGNOSIS — K5730 Diverticulosis of large intestine without perforation or abscess without bleeding: Secondary | ICD-10-CM | POA: Diagnosis not present

## 2023-06-01 DIAGNOSIS — R1031 Right lower quadrant pain: Secondary | ICD-10-CM | POA: Diagnosis not present

## 2023-06-01 NOTE — ED Triage Notes (Signed)
Pt states he had right groin pain and a lump popped up, lump has gotten smaller since it started. C/o nausea. Hx of hernia to left side

## 2023-06-02 ENCOUNTER — Emergency Department (HOSPITAL_COMMUNITY): Payer: Medicare HMO

## 2023-06-02 DIAGNOSIS — R1031 Right lower quadrant pain: Secondary | ICD-10-CM | POA: Diagnosis not present

## 2023-06-02 DIAGNOSIS — K5730 Diverticulosis of large intestine without perforation or abscess without bleeding: Secondary | ICD-10-CM | POA: Diagnosis not present

## 2023-06-02 LAB — CBC WITH DIFFERENTIAL/PLATELET
Abs Immature Granulocytes: 0.01 10*3/uL (ref 0.00–0.07)
Basophils Absolute: 0 10*3/uL (ref 0.0–0.1)
Basophils Relative: 1 %
Eosinophils Absolute: 0.1 10*3/uL (ref 0.0–0.5)
Eosinophils Relative: 2 %
HCT: 37.4 % — ABNORMAL LOW (ref 39.0–52.0)
Hemoglobin: 12.3 g/dL — ABNORMAL LOW (ref 13.0–17.0)
Immature Granulocytes: 0 %
Lymphocytes Relative: 19 %
Lymphs Abs: 1 10*3/uL (ref 0.7–4.0)
MCH: 30.3 pg (ref 26.0–34.0)
MCHC: 32.9 g/dL (ref 30.0–36.0)
MCV: 92.1 fL (ref 80.0–100.0)
Monocytes Absolute: 0.5 10*3/uL (ref 0.1–1.0)
Monocytes Relative: 9 %
Neutro Abs: 3.6 10*3/uL (ref 1.7–7.7)
Neutrophils Relative %: 69 %
Platelets: 217 10*3/uL (ref 150–400)
RBC: 4.06 MIL/uL — ABNORMAL LOW (ref 4.22–5.81)
RDW: 13.8 % (ref 11.5–15.5)
WBC: 5.3 10*3/uL (ref 4.0–10.5)
nRBC: 0 % (ref 0.0–0.2)

## 2023-06-02 LAB — COMPREHENSIVE METABOLIC PANEL
ALT: 18 U/L (ref 0–44)
AST: 21 U/L (ref 15–41)
Albumin: 3.9 g/dL (ref 3.5–5.0)
Alkaline Phosphatase: 84 U/L (ref 38–126)
Anion gap: 9 (ref 5–15)
BUN: 17 mg/dL (ref 8–23)
CO2: 22 mmol/L (ref 22–32)
Calcium: 9.2 mg/dL (ref 8.9–10.3)
Chloride: 105 mmol/L (ref 98–111)
Creatinine, Ser: 0.95 mg/dL (ref 0.61–1.24)
GFR, Estimated: >60 mL/min (ref >60)
Glucose, Bld: 176 mg/dL — ABNORMAL HIGH (ref 70–99)
Potassium: 3.6 mmol/L (ref 3.5–5.1)
Sodium: 136 mmol/L (ref 135–145)
Total Bilirubin: 0.5 mg/dL (ref <1.2)
Total Protein: 7.3 g/dL (ref 6.5–8.1)

## 2023-06-02 LAB — I-STAT CHEM 8, ED
BUN: 16 mg/dL (ref 8–23)
Calcium, Ion: 1.23 mmol/L (ref 1.15–1.40)
Chloride: 105 mmol/L (ref 98–111)
Creatinine, Ser: 0.9 mg/dL (ref 0.61–1.24)
Glucose, Bld: 181 mg/dL — ABNORMAL HIGH (ref 70–99)
HCT: 37 % — ABNORMAL LOW (ref 39.0–52.0)
Hemoglobin: 12.6 g/dL — ABNORMAL LOW (ref 13.0–17.0)
Potassium: 3.8 mmol/L (ref 3.5–5.1)
Sodium: 140 mmol/L (ref 135–145)
TCO2: 22 mmol/L (ref 22–32)

## 2023-06-02 MED ORDER — IOHEXOL 300 MG/ML  SOLN
100.0000 mL | Freq: Once | INTRAMUSCULAR | Status: AC | PRN
  Administered 2023-06-02: 100 mL via INTRAVENOUS

## 2023-06-02 NOTE — ED Provider Notes (Signed)
[place] EMERGENCY DEPARTMENT AT [facility] Provider Note   CSN: [id number] Arrival date & time: 06/01/23  2156     History  Chief Complaint  Patient presents with   Groin Pain    Jose Duarte is a 82 y.o. male.  Patient reports that he was arm wrestling a child earlier and felt a sudden pain in his right groin.  He reports that a lump the size of an egg popped up there.  When it occurred it was painful and he suddenly became nauseated.  Before he got to the ED, however, the lump disappeared.  He reports that a prior hernia surgery on the left but does not remember what symptoms he had with that.       Home Medications Prior to Admission medications   Medication Sig Start Date End Date Taking? Authorizing Provider  acetaminophen (TYLENOL) 500 MG tablet Take 1,000 mg by mouth every 6 (six) hours as needed for mild pain or headache.    [provider]  amLODipine (NORVASC) 5 MG tablet Take 5 mg by mouth daily.    [provider]  clopidogrel (PLAVIX) 75 MG tablet Take 1 tablet by mouth once daily 05/18/23   Azalee Course, [place]  Cyanocobalamin (B-12 PO) Take 1 tablet by mouth daily.    [provider]  cyclobenzaprine (FLEXERIL) 5 MG tablet Take 1 tablet (5 mg total) by mouth 3 (three) times daily as needed for muscle spasms. 04/08/23   Val Eagle D, NP  docusate sodium (COLACE) 100 MG capsule Take 1 capsule (100 mg total) by mouth 2 (two) times daily. 04/08/23   Val Eagle D, NP  glipiZIDE (GLUCOTROL XL) 10 MG 24 hr tablet Take 10 mg by mouth daily with breakfast.    [provider]  JARDIANCE 25 MG TABS tablet Take 25 mg by mouth daily.    [provider]  Lancets (ONETOUCH DELICA PLUS [place]) MISC SMARTSIG:1 Topical Daily 01/29/22   [provider]  losartan (COZAAR) 100 MG tablet Take 100 mg by mouth daily.    [provider]  [facility] VERIO test strip 1 each daily. 03/23/22   [provider]  oxyCODONE-acetaminophen (PERCOCET/ROXICET) 5-325 MG tablet Take 1-2 tablets by mouth every 6 (six) hours as needed for moderate pain (pain score 4-6). 04/08/23   Val Eagle D, NP  pantoprazole (PROTONIX) 40 MG tablet Take 40 mg by mouth daily. 01/15/15   [provider]  rosuvastatin (CRESTOR) 10 MG tablet Take 1 tablet by mouth once daily 04/13/23   Wendall Stade, MD  sildenafil (REVATIO) 20 MG tablet Take 20-100 mg by mouth daily as needed (ED).    [provider]  tamsulosin (FLOMAX) 0.4 MG CAPS capsule Take 1 capsule (0.4 mg total) by mouth daily. 04/09/23   Val Eagle D, NP      Allergies    Penicillins    Review of Systems   Review of Systems  Physical Exam Updated Vital Signs BP (!) 175/77   Pulse (!) 57   Temp 98.2 F (36.8 C) (Oral)   Resp 18   Ht 5\' 11"  (1.803 m)   Wt 76.2 kg   SpO2 98%   BMI 23.43 kg/m  Physical Exam Vitals and nursing note reviewed.  Constitutional:      General: He is not in acute distress.    Appearance: He is well-developed.  HENT:     Head: Normocephalic and atraumatic.     Mouth/Throat:  Mouth: Mucous membranes are moist.  Eyes:     General: Vision grossly intact. Gaze aligned appropriately.     Extraocular Movements: Extraocular movements intact.     Conjunctiva/sclera: Conjunctivae normal.  Cardiovascular:     Rate and Rhythm: Normal rate and regular rhythm.     Pulses: Normal pulses.     Heart sounds: Normal heart sounds, S1 normal and S2 normal. No murmur heard.    No friction rub. No gallop.  Pulmonary:     Effort: Pulmonary effort is normal. No respiratory distress.     Breath sounds: Normal breath sounds.  Abdominal:     Palpations: Abdomen is soft.     Tenderness: There is no abdominal tenderness. There is no guarding or rebound.     Hernia: No hernia is present.  Musculoskeletal:        General: No swelling.     Cervical back: Full passive range of motion without pain,  normal range of motion and neck supple. No pain with movement, spinous process tenderness or muscular tenderness. Normal range of motion.     Right lower leg: No edema.     Left lower leg: No edema.  Skin:    General: Skin is warm and dry.     Capillary Refill: Capillary refill takes less than 2 seconds.     Findings: No ecchymosis, erythema, lesion or wound.  Neurological:     Mental Status: He is alert and oriented to person, place, and time.     GCS: GCS eye subscore is 4. GCS verbal subscore is 5. GCS motor subscore is 6.     Cranial Nerves: Cranial nerves 2-12 are intact.     Sensory: Sensation is intact.     Motor: Motor function is intact. No weakness or abnormal muscle tone.     Coordination: Coordination is intact.  Psychiatric:        Mood and Affect: Mood normal.        Speech: Speech normal.        Behavior: Behavior normal.     ED Results / Procedures / Treatments   Labs (all labs ordered are listed, but only abnormal results are displayed) Labs Reviewed  CBC WITH DIFFERENTIAL/PLATELET - Abnormal; Notable for the following components:      Result Value   RBC 4.06 (*)    Hemoglobin 12.3 (*)    HCT 37.4 (*)    All other components within normal limits  COMPREHENSIVE METABOLIC PANEL - Abnormal; Notable for the following components:   Glucose, Bld 176 (*)    All other components within normal limits  I-STAT CHEM 8, ED - Abnormal; Notable for the following components:   Glucose, Bld 181 (*)    Hemoglobin 12.6 (*)    HCT 37.0 (*)    All other components within normal limits  URINALYSIS, ROUTINE W REFLEX MICROSCOPIC    EKG None  Radiology CT ABDOMEN PELVIS W CONTRAST Result Date: 06/02/2023 CLINICAL DATA:  Abdominal pain, right groin pain.  Swelling. EXAM: CT ABDOMEN AND PELVIS WITH CONTRAST TECHNIQUE: Multidetector CT imaging of the abdomen and pelvis was performed using the standard protocol following bolus administration of intravenous contrast. RADIATION DOSE  REDUCTION: This exam was performed according to the departmental dose-optimization program which includes automated exposure control, adjustment of the mA and/or kV according to patient size and/or use of iterative reconstruction technique. CONTRAST:  OMNIPAQUE IOHEXOL 300 MG/ML  SOLN COMPARISON:  02/01/2017 FINDINGS: Lower chest: No acute abnormality. Coronary  artery and aortic calcifications. Hepatobiliary: No focal liver abnormality is seen. Status post cholecystectomy. No biliary dilatation. Pancreas: No focal abnormality or ductal dilatation. Spleen: No focal abnormality.  Normal size. Adrenals/Urinary Tract: No adrenal abnormality. No focal renal abnormality. No stones or hydronephrosis. Urinary bladder is unremarkable. Stomach/Bowel: Left colonic diverticulosis. No active diverticulitis. Moderate stool burden. Stomach and small bowel decompressed. No bowel obstruction. Vascular/Lymphatic: Aortoiliac atherosclerosis. No evidence of aneurysm or adenopathy. Reproductive: Mildly prominent prostate Other: No free fluid or free air. Musculoskeletal: Postoperative changes in the lumbar spine. No acute bony abnormality. IMPRESSION: No acute findings in the abdomen or pelvis. Left colonic diverticulosis. Moderate stool burden. Electronically Signed   By: Charlett Nose M.D.   On: 06/02/2023 02:33    Procedures Procedures    Medications Ordered in ED Medications  iohexol (OMNIPAQUE) 300 MG/ML solution 100 mL (100 mLs Intravenous Contrast Given 06/02/23 0218)    ED Course/ Medical Decision Making/ A&P                                 Medical Decision Making Amount and/or Complexity of Data Reviewed Labs: ordered. Radiology: ordered.  Risk Prescription drug management.   Differential Diagnosis considered includes, but not limited to: Appendicitis; colitis; diverticulitis; bowel obstruction; hernia; cystitis; nephrolithiasis; pyelonephritis.  Presents to the emergency department with  complaints of right groin pain.  Patient had sudden onset of pain while arm wrestling.  He reports there was a egg sized knot in the area that has now resolved.  Sound suspicious for inguinal hernia that has spontaneously reduced.  Examination otherwise unremarkable.  He does report intermittent abdominal pain.  I suspect this is secondary to constipation which she has been experiencing due to analgesia that he has been on for weeks after lumbar surgery.  CT scan does not show any acute pathology.  No obstruction.        Final Clinical Impression(s) / ED Diagnoses Final diagnoses:  Right groin pain  Drug-induced constipation    Rx / DC Orders ED Discharge Orders     None         Wetzel Meester, Canary Brim, MD 06/02/23 [phone]

## 2023-06-20 DIAGNOSIS — R351 Nocturia: Secondary | ICD-10-CM | POA: Diagnosis not present

## 2023-06-20 DIAGNOSIS — N3941 Urge incontinence: Secondary | ICD-10-CM | POA: Diagnosis not present

## 2023-06-20 DIAGNOSIS — N401 Enlarged prostate with lower urinary tract symptoms: Secondary | ICD-10-CM | POA: Diagnosis not present

## 2023-06-20 DIAGNOSIS — R3912 Poor urinary stream: Secondary | ICD-10-CM | POA: Diagnosis not present

## 2023-06-20 DIAGNOSIS — N5201 Erectile dysfunction due to arterial insufficiency: Secondary | ICD-10-CM | POA: Diagnosis not present

## 2023-07-06 ENCOUNTER — Ambulatory Visit: Payer: Medicare HMO | Admitting: Cardiovascular Disease

## 2023-07-19 DIAGNOSIS — I2510 Atherosclerotic heart disease of native coronary artery without angina pectoris: Secondary | ICD-10-CM | POA: Diagnosis not present

## 2023-07-19 DIAGNOSIS — E538 Deficiency of other specified B group vitamins: Secondary | ICD-10-CM | POA: Diagnosis not present

## 2023-07-19 DIAGNOSIS — E118 Type 2 diabetes mellitus with unspecified complications: Secondary | ICD-10-CM | POA: Diagnosis not present

## 2023-07-19 DIAGNOSIS — K219 Gastro-esophageal reflux disease without esophagitis: Secondary | ICD-10-CM | POA: Diagnosis not present

## 2023-07-19 DIAGNOSIS — I10 Essential (primary) hypertension: Secondary | ICD-10-CM | POA: Diagnosis not present

## 2023-07-19 DIAGNOSIS — E7800 Pure hypercholesterolemia, unspecified: Secondary | ICD-10-CM | POA: Diagnosis not present

## 2023-07-19 DIAGNOSIS — Z23 Encounter for immunization: Secondary | ICD-10-CM | POA: Diagnosis not present

## 2023-07-26 DIAGNOSIS — C44329 Squamous cell carcinoma of skin of other parts of face: Secondary | ICD-10-CM | POA: Diagnosis not present

## 2023-07-26 DIAGNOSIS — D492 Neoplasm of unspecified behavior of bone, soft tissue, and skin: Secondary | ICD-10-CM | POA: Diagnosis not present

## 2023-07-26 DIAGNOSIS — L570 Actinic keratosis: Secondary | ICD-10-CM | POA: Diagnosis not present

## 2023-07-26 DIAGNOSIS — L538 Other specified erythematous conditions: Secondary | ICD-10-CM | POA: Diagnosis not present

## 2023-07-26 DIAGNOSIS — Z08 Encounter for follow-up examination after completed treatment for malignant neoplasm: Secondary | ICD-10-CM | POA: Diagnosis not present

## 2023-07-26 DIAGNOSIS — L821 Other seborrheic keratosis: Secondary | ICD-10-CM | POA: Diagnosis not present

## 2023-07-26 DIAGNOSIS — Z85828 Personal history of other malignant neoplasm of skin: Secondary | ICD-10-CM | POA: Diagnosis not present

## 2023-07-26 DIAGNOSIS — D225 Melanocytic nevi of trunk: Secondary | ICD-10-CM | POA: Diagnosis not present

## 2023-07-26 DIAGNOSIS — L814 Other melanin hyperpigmentation: Secondary | ICD-10-CM | POA: Diagnosis not present

## 2023-08-02 DIAGNOSIS — M4316 Spondylolisthesis, lumbar region: Secondary | ICD-10-CM | POA: Diagnosis not present

## 2023-08-02 DIAGNOSIS — Z6825 Body mass index (BMI) 25.0-25.9, adult: Secondary | ICD-10-CM | POA: Diagnosis not present

## 2023-08-17 DIAGNOSIS — L570 Actinic keratosis: Secondary | ICD-10-CM | POA: Diagnosis not present

## 2023-08-17 DIAGNOSIS — C44329 Squamous cell carcinoma of skin of other parts of face: Secondary | ICD-10-CM | POA: Diagnosis not present

## 2023-08-30 DIAGNOSIS — M4326 Fusion of spine, lumbar region: Secondary | ICD-10-CM | POA: Diagnosis not present

## 2023-08-30 DIAGNOSIS — M2560 Stiffness of unspecified joint, not elsewhere classified: Secondary | ICD-10-CM | POA: Diagnosis not present

## 2023-08-30 DIAGNOSIS — Z9889 Other specified postprocedural states: Secondary | ICD-10-CM | POA: Diagnosis not present

## 2023-08-30 DIAGNOSIS — R293 Abnormal posture: Secondary | ICD-10-CM | POA: Diagnosis not present

## 2023-08-30 DIAGNOSIS — M6281 Muscle weakness (generalized): Secondary | ICD-10-CM | POA: Diagnosis not present

## 2023-08-30 DIAGNOSIS — M5459 Other low back pain: Secondary | ICD-10-CM | POA: Diagnosis not present

## 2023-08-30 DIAGNOSIS — M4316 Spondylolisthesis, lumbar region: Secondary | ICD-10-CM | POA: Diagnosis not present

## 2023-08-30 DIAGNOSIS — R2681 Unsteadiness on feet: Secondary | ICD-10-CM | POA: Diagnosis not present

## 2023-08-30 DIAGNOSIS — R208 Other disturbances of skin sensation: Secondary | ICD-10-CM | POA: Diagnosis not present

## 2023-09-05 DIAGNOSIS — M4316 Spondylolisthesis, lumbar region: Secondary | ICD-10-CM | POA: Diagnosis not present

## 2023-09-05 DIAGNOSIS — M2560 Stiffness of unspecified joint, not elsewhere classified: Secondary | ICD-10-CM | POA: Diagnosis not present

## 2023-09-05 DIAGNOSIS — M5459 Other low back pain: Secondary | ICD-10-CM | POA: Diagnosis not present

## 2023-09-05 DIAGNOSIS — M6281 Muscle weakness (generalized): Secondary | ICD-10-CM | POA: Diagnosis not present

## 2023-09-05 DIAGNOSIS — R293 Abnormal posture: Secondary | ICD-10-CM | POA: Diagnosis not present

## 2023-09-05 DIAGNOSIS — R2681 Unsteadiness on feet: Secondary | ICD-10-CM | POA: Diagnosis not present

## 2023-09-05 DIAGNOSIS — M4326 Fusion of spine, lumbar region: Secondary | ICD-10-CM | POA: Diagnosis not present

## 2023-09-05 DIAGNOSIS — R208 Other disturbances of skin sensation: Secondary | ICD-10-CM | POA: Diagnosis not present

## 2023-09-05 DIAGNOSIS — Z9889 Other specified postprocedural states: Secondary | ICD-10-CM | POA: Diagnosis not present

## 2023-09-12 DIAGNOSIS — N5201 Erectile dysfunction due to arterial insufficiency: Secondary | ICD-10-CM | POA: Diagnosis not present

## 2023-09-12 DIAGNOSIS — R3912 Poor urinary stream: Secondary | ICD-10-CM | POA: Diagnosis not present

## 2023-09-12 DIAGNOSIS — R3915 Urgency of urination: Secondary | ICD-10-CM | POA: Diagnosis not present

## 2023-09-12 DIAGNOSIS — N401 Enlarged prostate with lower urinary tract symptoms: Secondary | ICD-10-CM | POA: Diagnosis not present

## 2023-09-14 DIAGNOSIS — R208 Other disturbances of skin sensation: Secondary | ICD-10-CM | POA: Diagnosis not present

## 2023-09-14 DIAGNOSIS — R2681 Unsteadiness on feet: Secondary | ICD-10-CM | POA: Diagnosis not present

## 2023-09-14 DIAGNOSIS — Z9889 Other specified postprocedural states: Secondary | ICD-10-CM | POA: Diagnosis not present

## 2023-09-14 DIAGNOSIS — R293 Abnormal posture: Secondary | ICD-10-CM | POA: Diagnosis not present

## 2023-09-14 DIAGNOSIS — M4326 Fusion of spine, lumbar region: Secondary | ICD-10-CM | POA: Diagnosis not present

## 2023-09-14 DIAGNOSIS — M6281 Muscle weakness (generalized): Secondary | ICD-10-CM | POA: Diagnosis not present

## 2023-09-14 DIAGNOSIS — M2560 Stiffness of unspecified joint, not elsewhere classified: Secondary | ICD-10-CM | POA: Diagnosis not present

## 2023-09-14 DIAGNOSIS — M5459 Other low back pain: Secondary | ICD-10-CM | POA: Diagnosis not present

## 2023-09-14 DIAGNOSIS — M4316 Spondylolisthesis, lumbar region: Secondary | ICD-10-CM | POA: Diagnosis not present

## 2023-09-16 DIAGNOSIS — M2560 Stiffness of unspecified joint, not elsewhere classified: Secondary | ICD-10-CM | POA: Diagnosis not present

## 2023-09-16 DIAGNOSIS — M4326 Fusion of spine, lumbar region: Secondary | ICD-10-CM | POA: Diagnosis not present

## 2023-09-16 DIAGNOSIS — M5459 Other low back pain: Secondary | ICD-10-CM | POA: Diagnosis not present

## 2023-09-16 DIAGNOSIS — Z9889 Other specified postprocedural states: Secondary | ICD-10-CM | POA: Diagnosis not present

## 2023-09-16 DIAGNOSIS — M4316 Spondylolisthesis, lumbar region: Secondary | ICD-10-CM | POA: Diagnosis not present

## 2023-09-16 DIAGNOSIS — R208 Other disturbances of skin sensation: Secondary | ICD-10-CM | POA: Diagnosis not present

## 2023-09-16 DIAGNOSIS — R2681 Unsteadiness on feet: Secondary | ICD-10-CM | POA: Diagnosis not present

## 2023-09-16 DIAGNOSIS — M6281 Muscle weakness (generalized): Secondary | ICD-10-CM | POA: Diagnosis not present

## 2023-09-16 DIAGNOSIS — R293 Abnormal posture: Secondary | ICD-10-CM | POA: Diagnosis not present

## 2023-09-19 DIAGNOSIS — H353110 Nonexudative age-related macular degeneration, right eye, stage unspecified: Secondary | ICD-10-CM | POA: Diagnosis not present

## 2023-09-19 DIAGNOSIS — H31112 Age-related choroidal atrophy, left eye: Secondary | ICD-10-CM | POA: Diagnosis not present

## 2023-09-19 DIAGNOSIS — Z961 Presence of intraocular lens: Secondary | ICD-10-CM | POA: Diagnosis not present

## 2023-09-19 DIAGNOSIS — Z7984 Long term (current) use of oral hypoglycemic drugs: Secondary | ICD-10-CM | POA: Diagnosis not present

## 2023-09-19 DIAGNOSIS — H524 Presbyopia: Secondary | ICD-10-CM | POA: Diagnosis not present

## 2023-09-19 DIAGNOSIS — E119 Type 2 diabetes mellitus without complications: Secondary | ICD-10-CM | POA: Diagnosis not present

## 2023-09-19 DIAGNOSIS — H26491 Other secondary cataract, right eye: Secondary | ICD-10-CM | POA: Diagnosis not present

## 2023-09-23 NOTE — Progress Notes (Signed)
 Patient ID: Jose Duarte, male   DOB: 17-Oct-1940, 83 y.o.   MRN: [id number]   83 y.o.  f/u.  Previously seen by Dr Tresa Endo. Did not like [facility] practice. Wanted to change to [facility]. On 03/01/13 He was taken acutely to the cath lab which revealed a totally occluded ramus branch. This was stented with a 3.0x16 mm Promus Premier DES stent postdilated 3.2 mm with 100% occlusion reduced to 0% and resumption of TIMI 3 flow. EF has been 45-50% by echo Last myovue January 2020 with old  Scar no ischemia EF 53%.    Some myalgias with lipitor less with crestor but not on statin now LDL 137 in April 2023   Some peripheral neuropathy affects walking   Echo reviewed 12/09/20 EF 68% mild MR improved   Now off all cholesterol meds Both Crestor and lipitor caused severe myalgias  Taking two oral hypoglycemics now with A1c 7.2 LDL good at 45   Wife passed of pancreatic cancer 2024 They were married 61 years   Myovue done 01/06/23 no ischemia EF 59% cleared for back surgery done 04/08/23 with Dr Lovell Sheehan L45 fusion  ***  ROS: Denies fever, malais, weight loss, blurry vision, decreased visual acuity, cough, sputum, SOB, hemoptysis, pleuritic pain, palpitaitons, heartburn, abdominal pain, melena, lower extremity edema, claudication, or rash.  All other systems reviewed and negative  General: Affect appropriate Healthy:  appears stated age HEENT: normal Neck supple with no adenopathy JVP normal no bruits no thyromegaly Lungs clear with no wheezing and good diaphragmatic motion Heart:  S1/S2 no murmur, no rub, gallop or click PMI normal Abdomen: benighn, BS positve, no tenderness, no AAA recent left inguinal hernia repair  no bruit.  No HSM or HJR Distal pulses intact with no bruits No edema Neuro non-focal Skin warm and dry No muscular weakness   Current Outpatient Medications  Medication Sig Dispense Refill   acetaminophen (TYLENOL) 500 MG tablet Take 1,000 mg by mouth every 6 (six) hours as  needed for mild pain or headache.     amLODipine (NORVASC) 5 MG tablet Take 5 mg by mouth daily.     clopidogrel (PLAVIX) 75 MG tablet Take 1 tablet by mouth once daily 90 tablet 2   Cyanocobalamin (B-12 PO) Take 1 tablet by mouth daily.     cyclobenzaprine (FLEXERIL) 5 MG tablet Take 1 tablet (5 mg total) by mouth 3 (three) times daily as needed for muscle spasms. 30 tablet 0   docusate sodium (COLACE) 100 MG capsule Take 1 capsule (100 mg total) by mouth 2 (two) times daily. 10 capsule 0   glipiZIDE (GLUCOTROL XL) 10 MG 24 hr tablet Take 10 mg by mouth daily with breakfast.     Lancets (ONETOUCH DELICA PLUS LANCET33G) MISC SMARTSIG:1 Topical Daily     losartan (COZAAR) 100 MG tablet Take 100 mg by mouth daily.     metFORMIN (GLUCOPHAGE-XR) 500 MG 24 hr tablet Take 500 mg by mouth 2 (two) times daily with a meal.     ONETOUCH VERIO test strip 1 each daily.     pantoprazole (PROTONIX) 40 MG tablet Take 40 mg by mouth daily.     rosuvastatin (CRESTOR) 10 MG tablet Take 1 tablet by mouth once daily 90 tablet 3   sildenafil (REVATIO) 20 MG tablet Take 20-100 mg by mouth daily as needed (ED).     tadalafil (CIALIS) 5 MG tablet Take 5 mg by mouth daily as needed.     tamsulosin (FLOMAX) 0.4  MG CAPS capsule Take 1 capsule (0.4 mg total) by mouth daily. 30 capsule 0   JARDIANCE 25 MG TABS tablet Take 25 mg by mouth daily. (Patient not taking: Reported on 09/30/2023)     oxyCODONE-acetaminophen (PERCOCET/ROXICET) 5-325 MG tablet Take 1-2 tablets by mouth every 6 (six) hours as needed for moderate pain (pain score 4-6). (Patient not taking: Reported on 09/30/2023) 30 tablet 0   No current facility-administered medications for this visit.    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  Continue current medications and low sodium Dash type diet.   DCM:  Ischemic EF normalized by TTE 12/09/20 68% continue medical Rx  Normal EF on myovue 01/06/23  Chol:  LDL 45 at goal continue crestor l  Prostate:  F/u urology flomax d/c ? 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  Target hemoglobin A1c is 6.5 or less.  Continue current medications. Ortho:  post L45 fusion 04/07/23 f/u Dr Lovell Sheehan     F/U with me in  A year    Charlton Haws

## 2023-09-30 ENCOUNTER — Ambulatory Visit: Payer: Medicare HMO | Attending: Cardiology | Admitting: Cardiovascular Disease

## 2023-09-30 ENCOUNTER — Encounter: Payer: Self-pay | Admitting: Cardiovascular Disease

## 2023-09-30 VITALS — BP 128/54 | HR 55 | Ht 71.0 in | Wt 180.0 lb

## 2023-09-30 DIAGNOSIS — R0600 Dyspnea, unspecified: Secondary | ICD-10-CM

## 2023-09-30 DIAGNOSIS — E785 Hyperlipidemia, unspecified: Secondary | ICD-10-CM

## 2023-09-30 DIAGNOSIS — I420 Dilated cardiomyopathy: Secondary | ICD-10-CM

## 2023-09-30 DIAGNOSIS — I10 Essential (primary) hypertension: Secondary | ICD-10-CM | POA: Diagnosis not present

## 2023-09-30 NOTE — Patient Instructions (Addendum)
 Medication Instructions:  Your physician recommends that you continue on your current medications as directed. Please refer to the Current Medication list given to you today.  *If you need a refill on your cardiac medications before your next appointment, please call your pharmacy*  Lab Work: If you have labs (blood work) drawn today and your tests are completely normal, you will receive your results only by: MyChart Message (if you have MyChart) OR A paper copy in the mail If you have any lab test that is abnormal or we need to change your treatment, we will call you to review the results.  Testing/Procedures: Your physician has requested that you have an echocardiogram. Echocardiography is a painless test that uses sound waves to create images of your heart. It provides your doctor with information about the size and shape of your heart and how well your heart's chambers and valves are working. This procedure takes approximately one hour. There are no restrictions for this procedure. Please do NOT wear cologne, perfume, aftershave, or lotions (deodorant is allowed). Please arrive 15 minutes prior to your appointment time.  Please note: We ask at that you not bring children with you during ultrasound (echo/ vascular) testing. Due to room size and safety concerns, children are not allowed in the ultrasound rooms during exams. Our front office staff cannot provide observation of children in our lobby area while testing is being conducted. An adult accompanying a patient to their appointment will only be allowed in the ultrasound room at the discretion of the ultrasound technician under special circumstances. We apologize for any inconvenience. Follow-Up: At [facility], you and your health needs are our priority.  As part of our continuing mission to provide you with exceptional heart care, our providers are all part of one team.  This team includes your primary Cardiologist (physician)  and Advanced Practice Providers or APPs (Physician Assistants and Nurse Practitioners) who all work together to provide you with the care you need, when you need it.  Your next appointment:   6 months  Provider:   Charlton Haws, MD     We recommend signing up for the patient portal called "MyChart".  Sign up information is provided on this After Visit Summary.  MyChart is used to connect with patients for Virtual Visits (Telemedicine).  Patients are able to view lab/test results, encounter notes, upcoming appointments, etc.  Non-urgent messages can be sent to your provider as well.   To learn more about what you can do with MyChart, go to [email or web address].   Other Instructions       1st Floor: - Lobby - Registration  - Pharmacy  - Lab - Cafe  2nd Floor: - PV Lab - Diagnostic Testing (echo, CT, nuclear med)  3rd Floor: - Vacant  4th Floor: - TCTS (cardiothoracic surgery) - AFib Clinic - Structural Heart Clinic - Vascular Surgery  - Vascular Ultrasound  5th Floor: - [facility] Cardiology (general and EP) - Clinical Pharmacy for coumadin, hypertension, lipid, weight-loss medications, and med management appointments    Valet parking services will be available as well.

## 2023-10-25 DIAGNOSIS — M25562 Pain in left knee: Secondary | ICD-10-CM | POA: Diagnosis not present

## 2023-11-07 DIAGNOSIS — M25572 Pain in left ankle and joints of left foot: Secondary | ICD-10-CM | POA: Diagnosis not present

## 2023-11-07 DIAGNOSIS — M25562 Pain in left knee: Secondary | ICD-10-CM | POA: Diagnosis not present

## 2023-11-09 ENCOUNTER — Ambulatory Visit (HOSPITAL_COMMUNITY)
Admission: RE | Admit: 2023-11-09 | Discharge: 2023-11-09 | Disposition: A | Discharge status: Home / Self Care | Source: Ambulatory Visit | Attending: Cardiovascular Disease | Admitting: Cardiovascular Disease

## 2023-11-09 DIAGNOSIS — R0600 Dyspnea, unspecified: Secondary | ICD-10-CM

## 2023-11-09 DIAGNOSIS — I341 Nonrheumatic mitral (valve) prolapse: Secondary | ICD-10-CM | POA: Diagnosis not present

## 2023-11-09 LAB — ECHOCARDIOGRAM COMPLETE
Area-P 1/2: 3.17 cm2
P 1/2 time: 450 ms
S' Lateral: 3.9 cm

## 2023-11-10 ENCOUNTER — Ambulatory Visit: Payer: Self-pay | Admitting: Cardiovascular Disease

## 2023-11-29 DIAGNOSIS — M4316 Spondylolisthesis, lumbar region: Secondary | ICD-10-CM | POA: Diagnosis not present

## 2023-12-08 DIAGNOSIS — K219 Gastro-esophageal reflux disease without esophagitis: Secondary | ICD-10-CM | POA: Diagnosis not present

## 2024-01-16 DIAGNOSIS — Z0120 Encounter for dental examination and cleaning without abnormal findings: Secondary | ICD-10-CM | POA: Diagnosis not present

## 2024-01-23 DIAGNOSIS — L821 Other seborrheic keratosis: Secondary | ICD-10-CM | POA: Diagnosis not present

## 2024-01-23 DIAGNOSIS — D225 Melanocytic nevi of trunk: Secondary | ICD-10-CM | POA: Diagnosis not present

## 2024-01-23 DIAGNOSIS — Z08 Encounter for follow-up examination after completed treatment for malignant neoplasm: Secondary | ICD-10-CM | POA: Diagnosis not present

## 2024-01-23 DIAGNOSIS — L814 Other melanin hyperpigmentation: Secondary | ICD-10-CM | POA: Diagnosis not present

## 2024-01-23 DIAGNOSIS — L570 Actinic keratosis: Secondary | ICD-10-CM | POA: Diagnosis not present

## 2024-01-23 DIAGNOSIS — Z85828 Personal history of other malignant neoplasm of skin: Secondary | ICD-10-CM | POA: Diagnosis not present

## 2024-02-14 DIAGNOSIS — K029 Dental caries, unspecified: Secondary | ICD-10-CM | POA: Diagnosis not present

## 2024-02-27 ENCOUNTER — Observation Stay (HOSPITAL_COMMUNITY)
Admission: EM | Admit: 2024-02-27 | Discharge: 2024-02-29 | Disposition: A | Discharge status: Home / Self Care | Attending: Family Medicine | Admitting: Family Medicine

## 2024-02-27 ENCOUNTER — Other Ambulatory Visit: Payer: Self-pay

## 2024-02-27 ENCOUNTER — Encounter (HOSPITAL_COMMUNITY): Payer: Self-pay

## 2024-02-27 ENCOUNTER — Emergency Department (HOSPITAL_COMMUNITY)

## 2024-02-27 DIAGNOSIS — E119 Type 2 diabetes mellitus without complications: Secondary | ICD-10-CM | POA: Insufficient documentation

## 2024-02-27 DIAGNOSIS — I452 Bifascicular block: Secondary | ICD-10-CM | POA: Diagnosis not present

## 2024-02-27 DIAGNOSIS — N400 Enlarged prostate without lower urinary tract symptoms: Secondary | ICD-10-CM | POA: Insufficient documentation

## 2024-02-27 DIAGNOSIS — Z79899 Other long term (current) drug therapy: Secondary | ICD-10-CM | POA: Diagnosis not present

## 2024-02-27 DIAGNOSIS — I700 Atherosclerosis of aorta: Secondary | ICD-10-CM | POA: Diagnosis not present

## 2024-02-27 DIAGNOSIS — I2510 Atherosclerotic heart disease of native coronary artery without angina pectoris: Secondary | ICD-10-CM | POA: Diagnosis not present

## 2024-02-27 DIAGNOSIS — R61 Generalized hyperhidrosis: Secondary | ICD-10-CM | POA: Diagnosis not present

## 2024-02-27 DIAGNOSIS — E785 Hyperlipidemia, unspecified: Secondary | ICD-10-CM | POA: Diagnosis present

## 2024-02-27 DIAGNOSIS — R55 Syncope and collapse: Secondary | ICD-10-CM | POA: Diagnosis not present

## 2024-02-27 DIAGNOSIS — I10 Essential (primary) hypertension: Secondary | ICD-10-CM | POA: Diagnosis not present

## 2024-02-27 DIAGNOSIS — F10920 Alcohol use, unspecified with intoxication, uncomplicated: Secondary | ICD-10-CM | POA: Diagnosis not present

## 2024-02-27 DIAGNOSIS — I517 Cardiomegaly: Secondary | ICD-10-CM | POA: Diagnosis not present

## 2024-02-27 DIAGNOSIS — I255 Ischemic cardiomyopathy: Secondary | ICD-10-CM | POA: Diagnosis present

## 2024-02-27 DIAGNOSIS — R001 Bradycardia, unspecified: Secondary | ICD-10-CM | POA: Diagnosis not present

## 2024-02-27 DIAGNOSIS — E118 Type 2 diabetes mellitus with unspecified complications: Secondary | ICD-10-CM | POA: Diagnosis not present

## 2024-02-27 DIAGNOSIS — I4510 Unspecified right bundle-branch block: Secondary | ICD-10-CM | POA: Diagnosis not present

## 2024-02-27 LAB — BASIC METABOLIC PANEL WITH GFR
Anion gap: 12 (ref 5–15)
BUN: 16 mg/dL (ref 8–23)
CO2: 23 mmol/L (ref 22–32)
Calcium: 9.1 mg/dL (ref 8.9–10.3)
Chloride: 103 mmol/L (ref 98–111)
Creatinine, Ser: 1.11 mg/dL (ref 0.61–1.24)
GFR, Estimated: >60 mL/min (ref >60)
Glucose, Bld: 201 mg/dL — ABNORMAL HIGH (ref 70–99)
Potassium: 3.9 mmol/L (ref 3.5–5.1)
Sodium: 138 mmol/L (ref 135–145)

## 2024-02-27 LAB — CBC
HCT: 42.3 % (ref 39.0–52.0)
Hemoglobin: 13.6 g/dL (ref 13.0–17.0)
MCH: 29.8 pg (ref 26.0–34.0)
MCHC: 32.2 g/dL (ref 30.0–36.0)
MCV: 92.6 fL (ref 80.0–100.0)
Platelets: 162 K/uL (ref 150–400)
RBC: 4.57 MIL/uL (ref 4.22–5.81)
RDW: 14.6 % (ref 11.5–15.5)
WBC: 5.3 K/uL (ref 4.0–10.5)
nRBC: 0 % (ref 0.0–0.2)

## 2024-02-27 LAB — TROPONIN I (HIGH SENSITIVITY)
Troponin I (High Sensitivity): 9 ng/L (ref <18)
Troponin I (High Sensitivity): 8 ng/L (ref <18)

## 2024-02-27 NOTE — H&P (Signed)
 History and Physical    Jose Duarte FMW:[id number] DOB: [birth year] DOA: 02/27/2024  Patient coming from: Home.  Chief Complaint: Almost passed out.  HPI: Jose Duarte is a 83 y.o. male with history of CAD status post PCI in 2014, diabetes mellitus type 2, hypertension presents to the ER after patient had a near syncopal episode at home while sitting on his bench next to his bar.  Patient states that he suddenly started feeling dizzy and his fiance noted that he was getting pale.  Did not lose consciousness.  He was eased out onto the floor and lay flat.  In the ER he had another episode while waiting.  Lasted for few minutes.  Denies any associated chest pain or shortness of breath.    ED Course: In the ER patient is found to be bradycardic with a heart rate in the 40s.  Labs show potassium of 3.9 troponin of 9 hemoglobin 13.6.  Cardiology was consulted patient to be admitted for further management.  Review of Systems: As per HPI, rest all negative.   Past Medical History:  Diagnosis Date   Arthritis    CAD S/P percutaneous coronary angioplasty - PCI RCA cardiologist-  dr delford   03-01-2013 Promus Premier DES 3.0 mm x 16 mm - post-dilated to 3.2 mm - Dr. Burnard    Cataract    Diverticulosis of colon    Family history of adverse reaction to anesthesia    brother --  ponv   GERD (gastroesophageal reflux disease)    History of adenomatous polyp of colon    History of CVA (cerebrovascular accident)    01-06-2009  right posterior temporal and occipital lobe   History of kidney stones    History of nonmelanoma skin cancer    EXCISION OF EAR   History of ST elevation myocardial infarction (STEMI) 03/01/2013   inferolateral s/p  DES to RI   History of transient ischemic attack (TIA)    2006  approx   HTN (hypertension), benign 03/02/2013   Hyperlipidemia    Hypertension    Incomplete right bundle branch block    Ischemic cardiomyopathy    ef 45-50% per last echo 2016 and  myview stress ef 52% 2016   Myocardial infarction (HCC)    Nocturia    OSA (obstructive sleep apnea)    study done  approx 2006--  no cpap   Peripheral neuropathy    Phimosis    Pneumonia    PONV (postoperative nausea and vomiting)    S/P drug eluting coronary stent placement 03/01/2013   x1 to [place]   Stroke (HCC)    2 mini strokes, no residual   Type 2 diabetes mellitus (HCC)    Wears glasses     Past Surgical History:  Procedure Laterality Date   APPENDECTOMY     CARDIAC CATHETERIZATION  09-18-2004   [facility]   no significant cad, normal LVF, ef 60%   CARDIOVASCULAR STRESS TEST  03-24-2015   dr delford   Low risk nuclear study w/ no ischemia;  (previous known lateral wall MI) non-reversible large moderate severity defect in the basal and mid inferolateral , basal and mid anterolateral ;  nuclear stress ef 52%   CATARACT EXTRACTION Bilateral    CERVICAL SPINE SURGERY  1980's   CIRCUMCISION N/A 03/26/2016   Procedure: CIRCUMCISION ADULT;  Surgeon: Mark Ottelin, MD;  Location: [facility];  Service: Urology;  Laterality: N/A;   DIAGNOSTIC LAPAROSCOPY     EXPLORATORY  LAPAROTOMY/  RIGHT COLECTOMY/  DRAINAGE INTRA-ABDOMINAL SUBPHRENIC ABSCESS AND EXCISION INFLAMMATORY MESENTERY ABSCESS WITHIN TRANSVERSE COLON  09-01-2009   dr lorriane   HERNIA REPAIR  1970's   INGUINAL HERNIA REPAIR Left 09/18/2012   Procedure: LAPAROSCOPIC INGUINAL HERNIA;  Surgeon: Lynda Leos, MD;  Location: [facility];  Service: General;  Laterality: Left;   INGUINAL HERNIA REPAIR Left 07/03/2014   Procedure: LEFT INGUINAL HERNIA REPAIR WITH MESH;  Surgeon: Donnice KATHEE Lunger, MD;  Location: [facility];  Service: General;  Laterality: Left;   INSERTION OF MESH Left 09/18/2012   Procedure: INSERTION OF MESH;  Surgeon: Lynda Leos, MD;  Location: [facility];  Service: General;  Laterality: Left;   LAPAROSCOPIC CHOLECYSTECTOMY  11-22-5   dr nicholaus   LAPAROSCOPIC LYSIS OF ADHESIONS N/A 09/18/2012    Procedure: LAPAROSCOPIC LYSIS OF ADHESIONS;  Surgeon: Lynda Leos, MD;  Location: [facility];  Service: General;  Laterality: N/A;   LEFT HEART CATHETERIZATION WITH CORONARY ANGIOGRAM N/A 03/01/2013   Procedure: LEFT HEART CATHETERIZATION WITH CORONARY ANGIOGRAM;  Surgeon: Debby DELENA Sor, MD;  Location: [facility] CATH LAB;  Service: Cardiovascular;  Laterality: N/A;  PTCA, thrombectomy, and DES stent to right proximal ramus intermediate;  diffuse 30-35% RCA,  diffuse 50% LAD;  preserved LVF w/ moderate anterolateral hypocontractility   LOOP RECORDER INSERTION N/A 03/17/2017   Procedure: LOOP RECORDER INSERTION;  Surgeon: Fernande Elspeth BROCKS, MD;  Location: [facility] INVASIVE CV LAB;  Service: Cardiovascular;  Laterality: N/A;   TRANSTHORACIC ECHOCARDIOGRAM  03/24/2015   basal, mid inferor and inferolateral hypokinesis, mild focal basal LVH, ef 45-50%, grade 1 diastolic dysfunction/  mild AV sclerosis without stenosis/  mild AR, MR, and TR/  mild LAE   VENTRAL HERNIA REPAIR N/A 09/18/2012   Procedure: LAPAROSCOPIC VENTRAL HERNIA;  Surgeon: Lynda Leos, MD;  Location: [facility];  Service: General;  Laterality: N/A;     reports that he has never smoked. He has quit using smokeless tobacco.  His smokeless tobacco use included chew. He reports current alcohol use of about 1.0 standard drink of alcohol per week. He reports that he does not use drugs.  Allergies  Allergen Reactions   Penicillins Anaphylaxis    Knots on head; tongue swelling    Family History  Problem Relation Age of Onset   Cancer Sister        lung    Prior to Admission medications   Medication Sig Start Date End Date Taking? Authorizing Provider  acetaminophen  (TYLENOL ) 500 MG tablet Take 1,000 mg by mouth every 6 (six) hours as needed for mild pain or headache.    [provider]  amLODipine  (NORVASC ) 5 MG tablet Take 5 mg by mouth daily.    [provider]  clopidogrel  (PLAVIX ) 75 MG tablet Take 1 tablet by mouth once  daily 05/18/23   Meng, Hao, PA  Cyanocobalamin  (B-12 PO) Take 1 tablet by mouth daily.    [provider]  cyclobenzaprine  (FLEXERIL ) 5 MG tablet Take 1 tablet (5 mg total) by mouth 3 (three) times daily as needed for muscle spasms. 04/08/23   Bergman, Meghan D, NP  docusate sodium  (COLACE) 100 MG capsule Take 1 capsule (100 mg total) by mouth 2 (two) times daily. 04/08/23   Bergman, Meghan D, NP  glipiZIDE  (GLUCOTROL  XL) 10 MG 24 hr tablet Take 10 mg by mouth daily with breakfast.    [provider]  JARDIANCE  25 MG TABS tablet Take 25 mg by mouth daily. Patient not taking: Reported on 09/30/2023  [provider]  Lancets (ONETOUCH DELICA PLUS [place]) MISC SMARTSIG:1 Topical Daily 01/29/22   [provider]  losartan  (COZAAR ) 100 MG tablet Take 100 mg by mouth daily.    [provider]  metFORMIN (GLUCOPHAGE-XR) 500 MG 24 hr tablet Take 500 mg by mouth 2 (two) times daily with a meal. 07/19/23   [provider]  [facility] VERIO test strip 1 each daily. 03/23/22   [provider]  oxyCODONE -acetaminophen  (PERCOCET/ROXICET) 5-325 MG tablet Take 1-2 tablets by mouth every 6 (six) hours as needed for moderate pain (pain score 4-6). Patient not taking: Reported on 09/30/2023 04/08/23   Bergman, Meghan D, NP  pantoprazole  (PROTONIX ) 40 MG tablet Take 40 mg by mouth daily. 01/15/15   [provider]  rosuvastatin  (CRESTOR ) 10 MG tablet Take 1 tablet by mouth once daily 04/13/23   Nishan, Peter C, MD  sildenafil (REVATIO) 20 MG tablet Take 20-100 mg by mouth daily as needed (ED).    [provider]  tadalafil (CIALIS) 5 MG tablet Take 5 mg by mouth daily as needed. 06/20/23   [provider]  tamsulosin  (FLOMAX ) 0.4 MG CAPS capsule Take 1 capsule (0.4 mg total) by mouth daily. 04/09/23   Jennetta Sayres D, NP    Physical Exam: Constitutional: Moderately built and nourished. Vitals:   02/27/24 2315 02/27/24 2316  02/27/24 2330 02/27/24 2345  BP: (!) 146/67  (!) 170/79 (!) 171/66  Pulse: (!) 47  (!) 57 (!) 55  Resp: 17  (!) 24 (!) 21  Temp:  (!) 97.4 F (36.3 C)    TempSrc:  Oral    SpO2: 98%  99% 100%   Eyes: Anicteric no pallor. ENMT: No discharge from the ears eyes nose or mouth. Neck: No mass felt.  No neck rigidity. Respiratory: No rhonchi or crepitations. Cardiovascular: S1-S2 heard. Abdomen: Soft nontender bowel sound present. Musculoskeletal: No edema. Skin: No rash. Neurologic: Alert awake oriented to time place and person.  Moves all extremities. Psychiatric: Appears normal.  Normal affect.   Labs on Admission: I have personally reviewed following labs and imaging studies  CBC: Recent Labs  Lab 02/27/24 1930  WBC 5.3  HGB 13.6  HCT 42.3  MCV 92.6  PLT 162   Basic Metabolic Panel: Recent Labs  Lab 02/27/24 1930  NA 138  K 3.9  CL 103  CO2 23  GLUCOSE 201*  BUN 16  CREATININE 1.11  CALCIUM  9.1   GFR: CrCl cannot be calculated (Unknown ideal weight.). Liver Function Tests: No results for input(s): AST, ALT, ALKPHOS, BILITOT, PROT, ALBUMIN in the last 168 hours. No results for input(s): LIPASE, AMYLASE in the last 168 hours. No results for input(s): AMMONIA in the last 168 hours. Coagulation Profile: No results for input(s): INR, PROTIME in the last 168 hours. Cardiac Enzymes: No results for input(s): CKTOTAL, CKMB, CKMBINDEX, TROPONINI in the last 168 hours. BNP (last 3 results) No results for input(s): PROBNP in the last 8760 hours. HbA1C: No results for input(s): HGBA1C in the last 72 hours. CBG: No results for input(s): GLUCAP in the last 168 hours. Lipid Profile: No results for input(s): CHOL, HDL, LDLCALC, TRIG, CHOLHDL, LDLDIRECT in the last 72 hours. Thyroid  Function Tests: No results for input(s): TSH, T4TOTAL, FREET4, T3FREE, THYROIDAB in the last 72 hours. Anemia Panel: No results for  input(s): VITAMINB12, FOLATE, FERRITIN, TIBC, IRON, RETICCTPCT in the last 72 hours. Urine analysis:    Component Value Date/Time   COLORURINE YELLOW 01/06/2009 1156  APPEARANCEUR CLEAR 01/06/2009 1156   LABSPEC 1.017 01/06/2009 1156   PHURINE 6.0 01/06/2009 1156   GLUCOSEU NEGATIVE 01/06/2009 1156   HGBUR NEGATIVE 01/06/2009 1156   BILIRUBINUR NEGATIVE 01/06/2009 1156   KETONESUR NEGATIVE 01/06/2009 1156   PROTEINUR NEGATIVE 01/06/2009 1156   UROBILINOGEN 0.2 01/06/2009 1156   NITRITE NEGATIVE 01/06/2009 1156   LEUKOCYTESUR  01/06/2009 1156    NEGATIVE MICROSCOPIC NOT DONE ON URINES WITH NEGATIVE PROTEIN, BLOOD, LEUKOCYTES, NITRITE, OR GLUCOSE <1000 mg/dL.   Sepsis Labs: @LABRCNTIP (procalcitonin:4,lacticidven:4) )No results found for this or any previous visit (from the past 240 hours).   Radiological Exams on Admission: DG Chest 2 View Result Date: 02/27/2024 CLINICAL DATA:  Near syncope EXAM: CHEST - 2 VIEW COMPARISON:  CT abdomen pelvis June 02, 2023 FINDINGS: The heart size is borderline normal to mildly enlarged. Aortic knob is prominent and calcified. Mild scarring of bilateral costophrenic angles. No consolidation. No pleural effusion. Loop recorder identified overlying the left chest wall. Mild degenerative changes of the spine. No acute osseous abnormality. IMPRESSION: Mild cardiomegaly. No acute cardiopulmonary disease. Electronically Signed   By: Megan  Zare M.D.   On: 02/27/2024 19:52    EKG: Independently reviewed.  Has bradycardia.  Assessment/Plan Principal Problem:   Syncope    Near syncope -     appreciate cardiology consult.  Plan is to keep patient n.p.o. past midnight in anticipation of possible pacemaker placement.  Check TSH. CAD status post PCI denies any chest pain.  On statins and Plavix . Hypertension on ARB. Diabetes mellitus type 2 hemoglobin A1c is 6.9 takes Jardiance .  On sliding scale coverage. BPH on Flomax .  Since patient has  near syncopal episode with bradycardia will need close monitoring further workup and more than 2 midnight stay.   DVT prophylaxis: Lovenox . Code Status: Full code. Family Communication: Patient's fianc at the bedside. Disposition Plan: Monitored bed. Consults called: Cardiology. Admission status: Observation.

## 2024-02-27 NOTE — ED Provider Triage Note (Signed)
 Emergency Medicine Provider Triage Evaluation Note  Jose Duarte , a 83 y.o. male  was evaluated in triage.  Pt complains of near syncopal episode after mowing the lawn history of hypertension, STEMI, GERD, and diabetes.  Patient denies any  symptoms at this time.  Review of Systems  Positive: Near syncope, diaphoresis Negative: Nausea, vomiting, fever, chills, chest pain, shortness of breath, dizziness, confusion, numbness, weakness  Physical Exam  BP 128/62 (BP Location: Left Arm)   Pulse (!) 57   Temp 98.2 F (36.8 C) (Oral)   SpO2 99%  Gen:   Awake, no distress   Resp:  Normal effort  MSK:   Moves extremities without difficulty  Other:    Medical Decision Making  Medically screening exam initiated at 6:47 PM.  Appropriate orders placed.  Jose Duarte was informed that the remainder of the evaluation will be completed by another provider, this initial triage assessment does not replace that evaluation, and the importance of remaining in the ED until their evaluation is complete.  Labs and imaging ordered   Francis Ileana LOISE DEVONNA 02/27/24 8150

## 2024-02-27 NOTE — ED Provider Notes (Addendum)
  EMERGENCY DEPARTMENT AT [facility] Provider Note   CSN: [id number] Arrival date & time: 02/27/24  1815     Patient presents with: Near Syncope, Dizziness, and Weakness   Jose Duarte is a 83 y.o. male.   The history is provided by the patient and a friend.  Near Syncope  Dizziness Associated symptoms: weakness   Weakness Associated symptoms: dizziness and near-syncope    He has history of hypertension, diabetes, hyperlipidemia, coronary artery disease, stroke and had an episode of near syncope.  He had sudden onset of feeling very dizzy and lightheaded.  This is associated with feeling clammy but he denies chest pain or heaviness or tightness or pressure and denies nausea or vomiting.  Episode lasted about 2 minutes.  While in the waiting room, he had a second episode.  An ECG was done on arrival and also following the second episode but he did not have an ECG during the episode.  Of note, he states that he has had problems with chest congestion for the last week, cough every morning productive of some clear to yellow sputum.  He denies fever or chills.  He has been taking pseudoephedrine and TheraFlu.  He denies similar episodes in the past.    Prior to Admission medications   Medication Sig Start Date End Date Taking? Authorizing Provider  acetaminophen  (TYLENOL ) 500 MG tablet Take 1,000 mg by mouth every 6 (six) hours as needed for mild pain or headache.    [provider]  amLODipine  (NORVASC ) 5 MG tablet Take 5 mg by mouth daily.    [provider]  clopidogrel  (PLAVIX ) 75 MG tablet Take 1 tablet by mouth once daily 05/18/23   Meng, Hao, PA  Cyanocobalamin  (B-12 PO) Take 1 tablet by mouth daily.    [provider]  cyclobenzaprine  (FLEXERIL ) 5 MG tablet Take 1 tablet (5 mg total) by mouth 3 (three) times daily as needed for muscle spasms. 04/08/23   Bergman, Meghan D, NP  docusate sodium  (COLACE) 100 MG capsule Take 1 capsule  (100 mg total) by mouth 2 (two) times daily. 04/08/23   Bergman, Meghan D, NP  glipiZIDE  (GLUCOTROL  XL) 10 MG 24 hr tablet Take 10 mg by mouth daily with breakfast.    [provider]  JARDIANCE  25 MG TABS tablet Take 25 mg by mouth daily. Patient not taking: Reported on 09/30/2023    [provider]  Lancets [facility] DELICA PLUS [place]) MISC SMARTSIG:1 Topical Daily 01/29/22   [provider]  losartan  (COZAAR ) 100 MG tablet Take 100 mg by mouth daily.    [provider]  metFORMIN (GLUCOPHAGE-XR) 500 MG 24 hr tablet Take 500 mg by mouth 2 (two) times daily with a meal. 07/19/23   [provider]  [facility] VERIO test strip 1 each daily. 03/23/22   [provider]  oxyCODONE -acetaminophen  (PERCOCET/ROXICET) 5-325 MG tablet Take 1-2 tablets by mouth every 6 (six) hours as needed for moderate pain (pain score 4-6). Patient not taking: Reported on 09/30/2023 04/08/23   Bergman, Meghan D, NP  pantoprazole  (PROTONIX ) 40 MG tablet Take 40 mg by mouth daily. 01/15/15   [provider]  rosuvastatin  (CRESTOR ) 10 MG tablet Take 1 tablet by mouth once daily 04/13/23   Nishan, Peter C, MD  sildenafil (REVATIO) 20 MG tablet Take 20-100 mg by mouth daily as needed (ED).    [provider]  tadalafil (CIALIS) 5 MG tablet Take 5 mg by mouth daily as  needed. 06/20/23   [provider]  tamsulosin  (FLOMAX ) 0.4 MG CAPS capsule Take 1 capsule (0.4 mg total) by mouth daily. 04/09/23   Bergman, Meghan D, NP    Allergies: Penicillins    Review of Systems  Cardiovascular:  Positive for near-syncope.  Neurological:  Positive for dizziness and weakness.  All other systems reviewed and are negative.   Updated Vital Signs BP 128/62 (BP Location: Left Arm)   Pulse (!) 57   Temp 98.2 F (36.8 C) (Oral)   SpO2 99%   Physical Exam Vitals and nursing note reviewed.   83 year old male, resting comfortably and in no acute distress. Vital  signs are significant for slightly slow heart rate. Oxygen saturation is 99%, which is normal. Head is normocephalic and atraumatic. PERRLA, EOMI. Oropharynx is clear. Neck is nontender and supple without adenopathy. Lungs are clear without rales, wheezes, or rhonchi. Chest is nontender. Heart has regular rate and rhythm without murmur. Abdomen is soft, flat, nontender. Extremities have no cyanosis or edema. Neurologic: Awake and alert, moves all extremities equally.  (all labs ordered are listed, but only abnormal results are displayed) Labs Reviewed  BASIC METABOLIC PANEL WITH GFR - Abnormal; Notable for the following components:      Result Value   Glucose, Bld 201 (*)    All other components within normal limits  CBC  TROPONIN I (HIGH SENSITIVITY)  TROPONIN I (HIGH SENSITIVITY)    EKG: EKG Interpretation Date/Time:  Monday February 27 2024 19:33:46 EDT Ventricular Rate:  57 PR Interval:  184 QRS Duration:  170 QT Interval:  464 QTC Calculation: 451 R Axis:   -73  Text Interpretation: Sinus bradycardia with Premature atrial complexes in a pattern of bigeminy Right bundle branch block Left anterior fascicular block Bifascicular block Possible Lateral infarct , age undetermined Abnormal ECG When compared with ECG of 03-Jan-2023 08:58, No significant change was found Confirmed by [facility]ord Lenis ([id number]) on 02/27/2024 11:01:27 PM   ED ECG REPORT   Date: 02/27/2024  Rate: 46  Rhythm: sinus bradycardia  QRS Axis: left  Intervals: normal  ST/T Wave abnormalities: nonspecific T wave changes  Conduction Disutrbances:right bundle branch block and left anterior fascicular block  Narrative Interpretation: Sinus bradycardia, right bundle branch block, left anterior fascicular block.  When compared with ECG of earlier today, heart rate has decreased.  Old EKG Reviewed: changes noted  I have personally reviewed the EKG tracing and agree with the computerized printout as  noted.     Radiology: DG Chest 2 View Result Date: 02/27/2024 CLINICAL DATA:  Near syncope EXAM: CHEST - 2 VIEW COMPARISON:  CT abdomen pelvis June 02, 2023 FINDINGS: The heart size is borderline normal to mildly enlarged. Aortic knob is prominent and calcified. Mild scarring of bilateral costophrenic angles. No consolidation. No pleural effusion. Loop recorder identified overlying the left chest wall. Mild degenerative changes of the spine. No acute osseous abnormality. IMPRESSION: Mild cardiomegaly. No acute cardiopulmonary disease. Electronically Signed   By: Megan  Zare M.D.   On: 02/27/2024 19:52     Procedures   Medications Ordered in the ED - No data to display                                  Medical Decision Making Risk Decision regarding hospitalization.   2 episodes of near syncope.  I have reviewed his electrocardiogram, my interpretation is sinus bradycardia, right bundle  branch block, left anterior fascicular block unchanged from prior.  Following second episode of near syncope he had a second electrocardiogram and I have reviewed data my interpretation is heart rate decreased compared with first ECG.  I have noted ECGs in the past with heart rate in the mid 40s.  I have reviewed his medications, and he is not on any medication which is likely to cause bradycardia.  However, I am concerned that he may have had significant bradycardic episode behind each of these episodes of near syncope.  I have reviewed his laboratory tests, my interpretation is normal electrolytes, normal renal function, normal CBC, normal troponin.  Chest x-ray shows mild cardiomegaly and otherwise unremarkable.  I have independently viewed the images, and agree with the radiologist's interpretation.  I have reviewed his past records, and I note echocardiogram on 11/09/2023 which showed left ventricular ejection fraction 45-50% with mildly dilated left ventricular cavity, asymmetric left ventricular  hypertrophy, grade 1 diastolic dysfunction.  He had a nuclear stress test on 01/06/2023 which was a low risk study.  Because of concern of arrhythmia, I do not feel he is safe for discharge.  I have discussed case with Dr. Franky of [facility], who agrees to admit the patient.  I have also discussed the case with Dr. Orlando of cardiology service who agrees to see the patient in consultation.     Final diagnoses:  Near syncope  Sinus bradycardia  Bifascicular block    ED Discharge Orders     None          Raford Lenis, MD 02/28/24 0000    Raford Lenis, MD 03/08/24 [phone]

## 2024-02-27 NOTE — ED Triage Notes (Signed)
 PT BIB [facility] D/t mowing lawn came in for dinner 17 before eat broke out cold sweat near ems reports had syncope episode No loc but called 911. Upon EMS arrival he was lying on couch fine no cp no stroke fiance thinks dehydration because he looks more pale then normal. Been advise need pace maker bundle branch right bi-gemi pvcs. Hx stroke x3 MI x1 diabetic / blood thinners  Bp 140/60 lying Bp 118/60 sitting Cbg 215   18g right ac Allergic penicillin

## 2024-02-28 ENCOUNTER — Encounter (HOSPITAL_COMMUNITY): Payer: Self-pay | Admitting: Internal Medicine

## 2024-02-28 DIAGNOSIS — R55 Syncope and collapse: Secondary | ICD-10-CM | POA: Diagnosis not present

## 2024-02-28 DIAGNOSIS — R001 Bradycardia, unspecified: Secondary | ICD-10-CM | POA: Diagnosis not present

## 2024-02-28 LAB — BASIC METABOLIC PANEL WITH GFR
Anion gap: 14 (ref 5–15)
BUN: 14 mg/dL (ref 8–23)
CO2: 20 mmol/L — ABNORMAL LOW (ref 22–32)
Calcium: 9.2 mg/dL (ref 8.9–10.3)
Chloride: 107 mmol/L (ref 98–111)
Creatinine, Ser: 0.91 mg/dL (ref 0.61–1.24)
GFR, Estimated: >60 mL/min (ref >60)
Glucose, Bld: 130 mg/dL — ABNORMAL HIGH (ref 70–99)
Potassium: 3.8 mmol/L (ref 3.5–5.1)
Sodium: 141 mmol/L (ref 135–145)

## 2024-02-28 LAB — CBC
HCT: 40.1 % (ref 39.0–52.0)
HCT: 40.4 % (ref 39.0–52.0)
Hemoglobin: 13 g/dL (ref 13.0–17.0)
Hemoglobin: 13.4 g/dL (ref 13.0–17.0)
MCH: 29.5 pg (ref 26.0–34.0)
MCH: 30.2 pg (ref 26.0–34.0)
MCHC: 32.4 g/dL (ref 30.0–36.0)
MCHC: 33.2 g/dL (ref 30.0–36.0)
MCV: 91.1 fL (ref 80.0–100.0)
MCV: 91 fL (ref 80.0–100.0)
Platelets: 156 K/uL (ref 150–400)
Platelets: 146 K/uL — ABNORMAL LOW (ref 150–400)
RBC: 4.4 MIL/uL (ref 4.22–5.81)
RBC: 4.44 MIL/uL (ref 4.22–5.81)
RDW: 14.6 % (ref 11.5–15.5)
RDW: 14.6 % (ref 11.5–15.5)
WBC: 4.6 K/uL (ref 4.0–10.5)
WBC: 4 K/uL (ref 4.0–10.5)
nRBC: 0 % (ref 0.0–0.2)
nRBC: 0 % (ref 0.0–0.2)

## 2024-02-28 LAB — CREATININE, SERUM
Creatinine, Ser: 0.96 mg/dL (ref 0.61–1.24)
GFR, Estimated: >60 mL/min (ref >60)

## 2024-02-28 LAB — HEMOGLOBIN A1C
Hgb A1c MFr Bld: 6.9 % — ABNORMAL HIGH (ref 4.8–5.6)
Mean Plasma Glucose: 151.33 mg/dL

## 2024-02-28 LAB — TSH: TSH: 1.146 u[IU]/mL (ref 0.350–4.500)

## 2024-02-28 LAB — CBG MONITORING, ED
Glucose-Capillary: 133 mg/dL — ABNORMAL HIGH (ref 70–99)
Glucose-Capillary: 135 mg/dL — ABNORMAL HIGH (ref 70–99)
Glucose-Capillary: 103 mg/dL — ABNORMAL HIGH (ref 70–99)

## 2024-02-28 MED ORDER — ROSUVASTATIN CALCIUM 5 MG PO TABS
10.0000 mg | ORAL_TABLET | Freq: Every day | ORAL | Status: DC
  Administered 2024-02-28 – 2024-02-29 (×2): 10 mg via ORAL
  Filled 2024-02-28 (×2): qty 2

## 2024-02-28 MED ORDER — INSULIN ASPART 100 UNIT/ML IJ SOLN: 0.0000 [IU] | INTRAMUSCULAR | Status: DC

## 2024-02-28 MED ORDER — LOSARTAN POTASSIUM 50 MG PO TABS
100.0000 mg | ORAL_TABLET | Freq: Every day | ORAL | Status: DC
  Administered 2024-02-28 – 2024-02-29 (×2): 100 mg via ORAL
  Filled 2024-02-28 (×2): qty 2

## 2024-02-28 MED ORDER — ENOXAPARIN SODIUM 40 MG/0.4ML IJ SOSY
40.0000 mg | PREFILLED_SYRINGE | Freq: Every day | INTRAMUSCULAR | Status: DC
  Administered 2024-02-28 – 2024-02-29 (×2): 40 mg via SUBCUTANEOUS
  Filled 2024-02-28 (×2): qty 0.4

## 2024-02-28 MED ORDER — CLOPIDOGREL BISULFATE 75 MG PO TABS
75.0000 mg | ORAL_TABLET | Freq: Every day | ORAL | Status: DC
  Administered 2024-02-28 – 2024-02-29 (×2): 75 mg via ORAL
  Filled 2024-02-28 (×2): qty 1

## 2024-02-28 NOTE — Plan of Care (Signed)

## 2024-02-28 NOTE — Consult Note (Signed)
 ELECTROPHYSIOLOGY CONSULT NOTE    Patient ID: Jose Duarte MRN: [id number], DOB/AGE: 1941-03-10 83 y.o.  Admit date: 02/27/2024 Date of Consult: 02/28/2024  Primary Physician: Rolinda Millman, MD Primary Cardiologist: Maude Emmer, MD  Electrophysiologist: none   Referring Provider: Dr. Lenon  Patient Profile: Jose Duarte is a 83 y.o. male with a history of CAD s/p PCI (2014), HTN, T2DM, bradycardia, cryptogenic CVA s/p ILR implanted (2018) who is being seen today for the evaluation of near syncope with baseline bradycardia at the request of Dr. Lenon.  HPI:  Jose Duarte is a 83 y.o. male with PMH as above who presented to [facility] ER after near-syncopal event yesterday. He has long history of bradycardia but asymptomatic.   Yesterday, he was in his usual state of health. He mowed grass in the afternoon - skipped lunch. He took theraflu and sudafed for nasal congestion, which is not uncommon for him. He was eating dinner with significant other, when he became woozy, felt like he was going to go limp. SO noticed he was clammy and pale. He did not completely lose consciousness. He proceeded to ER, and while in waiting room had a second presyncopal event. He was sitting down both times. He has never had anything like this happen before.   He denies chest pain, chest pressure, palpitations, bilateral edema. No change in appetite or SOB.    Labs Potassium3.8 (09/09 0450)   Creatinine, ser  0.91 (09/09 0450) PLT  146* (09/09 0450) HGB  13.4 (09/09 0450) WBC 4.0 (09/09 0450) Troponin I (High Sensitivity)8 (09/08 2143).    Past Medical History:  Diagnosis Date   Arthritis    CAD S/P percutaneous coronary angioplasty - PCI RCA cardiologist-  dr emmer   03-01-2013 Promus Premier DES 3.0 mm x 16 mm - post-dilated to 3.2 mm - Dr. Burnard    Cataract    Diverticulosis of colon    Family history of adverse reaction to anesthesia    brother --  ponv   GERD  (gastroesophageal reflux disease)    History of adenomatous polyp of colon    History of CVA (cerebrovascular accident)    01-06-2009  right posterior temporal and occipital lobe   History of kidney stones    History of nonmelanoma skin cancer    EXCISION OF EAR   History of ST elevation myocardial infarction (STEMI) 03/01/2013   inferolateral s/p  DES to RI   History of transient ischemic attack (TIA)    2006  approx   HTN (hypertension), benign 03/02/2013   Hyperlipidemia    Hypertension    Incomplete right bundle branch block    Ischemic cardiomyopathy    ef 45-50% per last echo 2016 and myview stress ef 52% 2016   Myocardial infarction (HCC)    Nocturia    OSA (obstructive sleep apnea)    study done  approx 2006--  no cpap   Peripheral neuropathy    Phimosis    Pneumonia    PONV (postoperative nausea and vomiting)    S/P drug eluting coronary stent placement 03/01/2013   x1 to Ramus   Stroke (HCC)    2 mini strokes, no residual   Type 2 diabetes mellitus (HCC)    Wears glasses      Surgical History:  Past Surgical History:  Procedure Laterality Date   APPENDECTOMY     CARDIAC CATHETERIZATION  09-18-2004   [facility]   no significant cad, normal LVF, ef 60%  CARDIOVASCULAR STRESS TEST  03-24-2015   dr delford   Low risk nuclear study w/ no ischemia;  (previous known lateral wall MI) non-reversible large moderate severity defect in the basal and mid inferolateral , basal and mid anterolateral ;  nuclear stress ef 52%   CATARACT EXTRACTION Bilateral    CERVICAL SPINE SURGERY  1980's   CIRCUMCISION N/A 03/26/2016   Procedure: CIRCUMCISION ADULT;  Surgeon: Mark Ottelin, MD;  Location: [facility];  Service: Urology;  Laterality: N/A;   DIAGNOSTIC LAPAROSCOPY     EXPLORATORY LAPAROTOMY/  RIGHT COLECTOMY/  DRAINAGE INTRA-ABDOMINAL SUBPHRENIC ABSCESS AND EXCISION INFLAMMATORY MESENTERY ABSCESS WITHIN TRANSVERSE COLON  09-01-2009   dr lorriane   HERNIA REPAIR   1970's   INGUINAL HERNIA REPAIR Left 09/18/2012   Procedure: LAPAROSCOPIC INGUINAL HERNIA;  Surgeon: Lynda Leos, MD;  Location: [facility];  Service: General;  Laterality: Left;   INGUINAL HERNIA REPAIR Left 07/03/2014   Procedure: LEFT INGUINAL HERNIA REPAIR WITH MESH;  Surgeon: Donnice KATHEE Lunger, MD;  Location: [facility];  Service: General;  Laterality: Left;   INSERTION OF MESH Left 09/18/2012   Procedure: INSERTION OF MESH;  Surgeon: Lynda Leos, MD;  Location: [facility];  Service: General;  Laterality: Left;   LAPAROSCOPIC CHOLECYSTECTOMY  11-22-5   dr nicholaus   LAPAROSCOPIC LYSIS OF ADHESIONS N/A 09/18/2012   Procedure: LAPAROSCOPIC LYSIS OF ADHESIONS;  Surgeon: Lynda Leos, MD;  Location: [facility];  Service: General;  Laterality: N/A;   LEFT HEART CATHETERIZATION WITH CORONARY ANGIOGRAM N/A 03/01/2013   Procedure: LEFT HEART CATHETERIZATION WITH CORONARY ANGIOGRAM;  Surgeon: Debby DELENA Sor, MD;  Location: [facility] CATH LAB;  Service: Cardiovascular;  Laterality: N/A;  PTCA, thrombectomy, and DES stent to right proximal ramus intermediate;  diffuse 30-35% RCA,  diffuse 50% LAD;  preserved LVF w/ moderate anterolateral hypocontractility   LOOP RECORDER INSERTION N/A 03/17/2017   Procedure: LOOP RECORDER INSERTION;  Surgeon: Fernande Elspeth BROCKS, MD;  Location: [facility] INVASIVE CV LAB;  Service: Cardiovascular;  Laterality: N/A;   TRANSTHORACIC ECHOCARDIOGRAM  03/24/2015   basal, mid inferor and inferolateral hypokinesis, mild focal basal LVH, ef 45-50%, grade 1 diastolic dysfunction/  mild AV sclerosis without stenosis/  mild AR, MR, and TR/  mild LAE   VENTRAL HERNIA REPAIR N/A 09/18/2012   Procedure: LAPAROSCOPIC VENTRAL HERNIA;  Surgeon: Lynda Leos, MD;  Location: [facility];  Service: General;  Laterality: N/A;     (Not in a hospital admission)   Inpatient Medications:   clopidogrel   75 mg Oral Daily   enoxaparin  (LOVENOX ) injection  40 mg Subcutaneous Daily   insulin  aspart  0-9 Units  Subcutaneous Q4H   losartan   100 mg Oral Daily   rosuvastatin   10 mg Oral Daily    Allergies:  Allergies  Allergen Reactions   Flexeril  [Cyclobenzaprine ] Anaphylaxis    Psychosis.   Penicillins Anaphylaxis    Knots on head; tongue swelling   Amlodipine      Other Reaction(s): swelling in ankles    Family History  Problem Relation Age of Onset   Cancer Sister        lung     Physical Exam: Vitals:   02/28/24 0945 02/28/24 1000 02/28/24 1015 02/28/24 1045  BP: (!) 164/99 (!) 168/76 (!) 181/66 (!) 171/76  Pulse: (!) 48 (!) 50 (!) 54 (!) 56  Resp: 12 11 10 19   Temp:      TempSrc:      SpO2: 100% 100% 100% 96%    GEN-  NAD, A&O x 3, normal affect HEENT: Normocephalic, atraumatic Lungs- CTAB, Normal effort.  Heart- Regular rate and rhythm, No M/G/R.  GI- Soft, NT, ND.  Extremities- No clubbing, cyanosis, or edema   Radiology/Studies: DG Chest 2 View Result Date: 02/27/2024 CLINICAL DATA:  Near syncope EXAM: CHEST - 2 VIEW COMPARISON:  CT abdomen pelvis June 02, 2023 FINDINGS: The heart size is borderline normal to mildly enlarged. Aortic knob is prominent and calcified. Mild scarring of bilateral costophrenic angles. No consolidation. No pleural effusion. Loop recorder identified overlying the left chest wall. Mild degenerative changes of the spine. No acute osseous abnormality. IMPRESSION: Mild cardiomegaly. No acute cardiopulmonary disease. Electronically Signed   By: Megan  Zare M.D.   On: 02/27/2024 19:52   10/2023 TTE -  1. Left ventricular ejection fraction, by estimation, is 45 to 50%. The  left ventricle has mildly decreased function. The left ventricle  demonstrates global hypokinesis. The left ventricular internal cavity size was mildly dilated. There is mild asymmetric left ventricular hypertrophy of the basal-septal segment. Left ventricular diastolic parameters are consistent with Grade I diastolic dysfunction (impaired relaxation).   2. Right ventricular  systolic function is mildly reduced. The right  ventricular size is moderately enlarged. There is normal pulmonary artery systolic pressure.   3. Left atrial size was moderately dilated.   4. Right atrial size was moderately dilated.   5. A small pericardial effusion is present. The pericardial effusion is  localized near the right atrium. There is no evidence of cardiac  tamponade.   6. The mitral valve is normal in structure. Mild mitral valve regurgitation. No evidence of mitral stenosis.   7. The aortic valve is tricuspid. There is mild calcification of the aortic valve. Aortic valve regurgitation is trivial. No aortic stenosis is  present.   8. The inferior vena cava is normal in size with greater than 50%  respiratory variability, suggesting right atrial pressure of 3 mmHg.   EKG: 02/26/2021 at 2258 - SR at 48, 1st deg HB, RBBB (personally reviewed)  TELEMETRY: SB with occasional PACs (personally reviewed)  DEVICE HISTORY: none  Assessment/Plan: #) bradycardia #) RBBB, 1st deg HB #) presyncope #) HTN  Patient presented to ER after presyncopal event while sitting, no history of such. While in ER waiting room, had second presyncopal event. He was not on telemetry during the event in waiting room He has long history of bradycardia with RBBB, asymptomatic Unclear if his presyncopal event is related to worsening of his conduction system.  Obtain orthostatics Walk with nursing to assess for chronotropic competence  MD to see         For questions or updates, please contact [facility] Please consult [email or web address] for contact info under Cardiology/STEMI.  Signed, Ernie Sagrero, NP  02/28/2024 11:50 AM

## 2024-02-28 NOTE — Progress Notes (Signed)
 PROGRESS NOTE Jose Duarte    DOB: April 24, 1941, 83 y.o.  FMW:[id number]    Code Status: Full Code   DOA: 02/27/2024   LOS: 0  Brief hospital course  Jose Duarte is a 83 y.o. male with a PMH significant for CAD status post PCI in 2014, diabetes mellitus type 2, hypertension presents to the ER after patient had a near syncopal episode at home. Did not lose consciousness.  He was eased out onto the floor and lay flat.  In the ER he had another episode while waiting.  Lasted for few minutes.  Denies any associated chest pain or shortness of breath.     ED Course:  bradycardic with a heart rate in the 40s.  Labs show potassium of 3.9 troponin of 9 hemoglobin 13.6.   Patient was admitted to medicine service for further workup and management of bradycardia as outlined in detail below.  02/28/24 -patient was stable overnight without further episodes. EP to see today to discuss Pacemaker.   Assessment & Plan  Principal Problem:   Near syncope Active Problems:   HTN (hypertension), benign   Hyperlipidemia LDL goal <70   CAD S/P percutaneous coronary angioplasty - PCI Ramus   Diabetes mellitus type 2 with complications - CAD   Ischemic cardiomyopathy  Near syncope - TSH normal. Bradycardic on telemetry overnight without further episodes.  - EP consult pending.  - orthostatic vitals  CAD status post PCI denies any chest pain.  On statins and Plavix . Hypertension on ARB. Diabetes mellitus type 2 hemoglobin A1c is 6.9 takes Jardiance .  On sliding scale coverage. BPH on Flomax .  There is no height or weight on file to calculate BMI.  VTE ppx: enoxaparin  (LOVENOX ) injection 40 mg Start: 02/28/24 1000  Diet:     Diet   Diet NPO time specified Except for: Sips with Meds   Consultants: Cardiology  EP  Subjective 02/28/24    Pt reports feeling well. Denies chest pain, palpitations, SOB. Had no episodes of syncope overnight.    Objective  Blood pressure (!) 162/78, pulse (!)  50, temperature 98 F (36.7 C), temperature source Oral, resp. rate 15, SpO2 98%. No intake or output data in the 24 hours ending 02/28/24 0654 There were no vitals filed for this visit.   Physical Exam:  General: awake, alert, NAD HEENT: atraumatic, clear conjunctiva, anicteric sclera, MMM, hearing grossly normal Respiratory: normal respiratory effort. Cardiovascular: quick capillary refill, normal S1/S2, RRR, no JVD, murmurs Gastrointestinal: soft, NT, ND Nervous: A&O x3. no gross focal neurologic deficits, normal speech Extremities: moves all equally, no edema, normal tone Skin: dry, intact, normal temperature, normal color. No rashes, lesions or ulcers on exposed skin Psychiatry: normal mood, congruent affect  Labs   I have personally reviewed the following labs and imaging studies CBC    Component Value Date/Time   WBC 4.0 02/28/2024 0450   RBC 4.44 02/28/2024 0450   HGB 13.4 02/28/2024 0450   HCT 40.4 02/28/2024 0450   PLT 146 (L) 02/28/2024 0450   MCV 91.0 02/28/2024 0450   MCH 30.2 02/28/2024 0450   MCHC 33.2 02/28/2024 0450   RDW 14.6 02/28/2024 0450   LYMPHSABS 1.0 06/02/2023 0102   MONOABS 0.5 06/02/2023 0102   EOSABS 0.1 06/02/2023 0102   BASOSABS 0.0 06/02/2023 0102      Latest Ref Rng & Units 02/28/2024    4:50 AM 02/28/2024    1:50 AM 02/27/2024    7:30 PM  BMP  Glucose  70 - 99 mg/dL 869   798   BUN 8 - 23 mg/dL 14   16   Creatinine 9.38 - 1.24 mg/dL 9.08  9.03  8.88   Sodium 135 - 145 mmol/L 141   138   Potassium 3.5 - 5.1 mmol/L 3.8   3.9   Chloride 98 - 111 mmol/L 107   103   CO2 22 - 32 mmol/L 20   23   Calcium  8.9 - 10.3 mg/dL 9.2   9.1     DG Chest 2 View Result Date: 02/27/2024 CLINICAL DATA:  Near syncope EXAM: CHEST - 2 VIEW COMPARISON:  CT abdomen pelvis June 02, 2023 FINDINGS: The heart size is borderline normal to mildly enlarged. Aortic knob is prominent and calcified. Mild scarring of bilateral costophrenic angles. No consolidation. No  pleural effusion. Loop recorder identified overlying the left chest wall. Mild degenerative changes of the spine. No acute osseous abnormality. IMPRESSION: Mild cardiomegaly. No acute cardiopulmonary disease. Electronically Signed   By: Megan  Zare M.D.   On: 02/27/2024 19:52    Disposition Plan & Communication  Patient status: Observation  Admitted From: Home Planned disposition location: Home Anticipated discharge date: 9/11 pending EP management   Family Communication: fiancee at bedside     Author: Marien LITTIE Piety, DO [facility] 02/28/2024, 6:54 AM   Available by Epic secure chat 7AM-7PM. If 7PM-7AM, please contact night-coverage.  [facility] contact information found on [email or web address].

## 2024-02-28 NOTE — Consult Note (Signed)
 Cardiology Consultation   Patient ID: Jose Duarte MRN: [id number]; DOB: May 20, 1941  Admit date: 02/27/2024 Date of Consult: 02/28/2024  PCP:  Rolinda Millman, MD   [facility] Providers Cardiologist:  Maude Emmer, MD    Patient Profile: Jose Duarte is a 83 y.o. male with a hx of CAD with prior inferolateral STEMI, bifascicular block, hypertension, DM in  who is being seen 02/28/2024 for the evaluation of pre-syncope at the request of Dr. Raford.  History of Present Illness: Jose Duarte was in his usual state of healthy earlier on 9/8 when he had a sudden onset of feeling ill. He was sitting on a stool bench at his home bar and eating dinner when he had a near syncopal episode. He began white as a sheet per his fiancee and she had to help him down to the couch. He did not lose consciousness but felt as though he was going to pass out. He had another episode here in the ED where he began very lightheaded and had a near syncopal event.   He has had bradycardia with a bifascicular block for at least a year now. However, referral to EP was deferred as he was largely asymptomatic.   The patient feels well currently and has no chest pain, shortness of breath, or extremity weakness. He has not had an episode like this before.  Of note, the patient is getting married on Sept. 27th and then has plans for an 8 day honeymoon in [place]  afterwards.    Past Medical History:  Diagnosis Date   Arthritis    CAD S/P percutaneous coronary angioplasty - PCI RCA cardiologist-  dr emmer   03-01-2013 Promus Premier DES 3.0 mm x 16 mm - post-dilated to 3.2 mm - Dr. Burnard    Cataract    Diverticulosis of colon    Family history of adverse reaction to anesthesia    brother --  ponv   GERD (gastroesophageal reflux disease)    History of adenomatous polyp of colon    History of CVA (cerebrovascular accident)    01-06-2009  right posterior temporal and occipital lobe   History of  kidney stones    History of nonmelanoma skin cancer    EXCISION OF EAR   History of ST elevation myocardial infarction (STEMI) 03/01/2013   inferolateral s/p  DES to RI   History of transient ischemic attack (TIA)    2006  approx   HTN (hypertension), benign 03/02/2013   Hyperlipidemia    Hypertension    Incomplete right bundle branch block    Ischemic cardiomyopathy    ef 45-50% per last echo 2016 and myview stress ef 52% 2016   Myocardial infarction (HCC)    Nocturia    OSA (obstructive sleep apnea)    study done  approx 2006--  no cpap   Peripheral neuropathy    Phimosis    Pneumonia    PONV (postoperative nausea and vomiting)    S/P drug eluting coronary stent placement 03/01/2013   x1 to Ramus   Stroke (HCC)    2 mini strokes, no residual   Type 2 diabetes mellitus (HCC)    Wears glasses     Past Surgical History:  Procedure Laterality Date   APPENDECTOMY     CARDIAC CATHETERIZATION  09-18-2004   [facility]   no significant cad, normal LVF, ef 60%   CARDIOVASCULAR STRESS TEST  03-24-2015   dr emmer   Low risk nuclear study w/ no ischemia;  (  previous known lateral wall MI) non-reversible large moderate severity defect in the basal and mid inferolateral , basal and mid anterolateral ;  nuclear stress ef 52%   CATARACT EXTRACTION Bilateral    CERVICAL SPINE SURGERY  1980's   CIRCUMCISION N/A 03/26/2016   Procedure: CIRCUMCISION ADULT;  Surgeon: Mark Ottelin, MD;  Location: [facility];  Service: Urology;  Laterality: N/A;   DIAGNOSTIC LAPAROSCOPY     EXPLORATORY LAPAROTOMY/  RIGHT COLECTOMY/  DRAINAGE INTRA-ABDOMINAL SUBPHRENIC ABSCESS AND EXCISION INFLAMMATORY MESENTERY ABSCESS WITHIN TRANSVERSE COLON  09-01-2009   dr lorriane   HERNIA REPAIR  1970's   INGUINAL HERNIA REPAIR Left 09/18/2012   Procedure: LAPAROSCOPIC INGUINAL HERNIA;  Surgeon: Lynda Leos, MD;  Location: [facility];  Service: General;  Laterality: Left;   INGUINAL HERNIA REPAIR Left  07/03/2014   Procedure: LEFT INGUINAL HERNIA REPAIR WITH MESH;  Surgeon: Donnice KATHEE Lunger, MD;  Location: [facility];  Service: General;  Laterality: Left;   INSERTION OF MESH Left 09/18/2012   Procedure: INSERTION OF MESH;  Surgeon: Lynda Leos, MD;  Location: [facility];  Service: General;  Laterality: Left;   LAPAROSCOPIC CHOLECYSTECTOMY  11-22-5   dr nicholaus   LAPAROSCOPIC LYSIS OF ADHESIONS N/A 09/18/2012   Procedure: LAPAROSCOPIC LYSIS OF ADHESIONS;  Surgeon: Lynda Leos, MD;  Location: [facility];  Service: General;  Laterality: N/A;   LEFT HEART CATHETERIZATION WITH CORONARY ANGIOGRAM N/A 03/01/2013   Procedure: LEFT HEART CATHETERIZATION WITH CORONARY ANGIOGRAM;  Surgeon: Debby DELENA Sor, MD;  Location: [facility] CATH LAB;  Service: Cardiovascular;  Laterality: N/A;  PTCA, thrombectomy, and DES stent to right proximal ramus intermediate;  diffuse 30-35% RCA,  diffuse 50% LAD;  preserved LVF w/ moderate anterolateral hypocontractility   LOOP RECORDER INSERTION N/A 03/17/2017   Procedure: LOOP RECORDER INSERTION;  Surgeon: Fernande Elspeth BROCKS, MD;  Location: [facility] INVASIVE CV LAB;  Service: Cardiovascular;  Laterality: N/A;   TRANSTHORACIC ECHOCARDIOGRAM  03/24/2015   basal, mid inferor and inferolateral hypokinesis, mild focal basal LVH, ef 45-50%, grade 1 diastolic dysfunction/  mild AV sclerosis without stenosis/  mild AR, MR, and TR/  mild LAE   VENTRAL HERNIA REPAIR N/A 09/18/2012   Procedure: LAPAROSCOPIC VENTRAL HERNIA;  Surgeon: Lynda Leos, MD;  Location: [facility];  Service: General;  Laterality: N/A;   Home Medications:  Prior to Admission medications   Medication Sig Start Date End Date Taking? Authorizing Provider  acetaminophen  (TYLENOL ) 500 MG tablet Take 1,000 mg by mouth every 6 (six) hours as needed for mild pain or headache.    [provider]  amLODipine  (NORVASC ) 5 MG tablet Take 5 mg by mouth daily.    [provider]  clopidogrel  (PLAVIX ) 75 MG tablet Take 1 tablet  by mouth once daily 05/18/23   Meng, Hao, PA  Cyanocobalamin  (B-12 PO) Take 1 tablet by mouth daily.    [provider]  cyclobenzaprine  (FLEXERIL ) 5 MG tablet Take 1 tablet (5 mg total) by mouth 3 (three) times daily as needed for muscle spasms. 04/08/23   Bergman, Meghan D, NP  docusate sodium  (COLACE) 100 MG capsule Take 1 capsule (100 mg total) by mouth 2 (two) times daily. 04/08/23   Bergman, Meghan D, NP  glipiZIDE  (GLUCOTROL  XL) 10 MG 24 hr tablet Take 10 mg by mouth daily with breakfast.    [provider]  JARDIANCE  25 MG TABS tablet Take 25 mg by mouth daily. Patient not taking: Reported on 09/30/2023    [provider]  Lancets (ONETOUCH DELICA PLUS LANCET33G) MISC SMARTSIG:1 Topical Daily 01/29/22   [provider]  losartan  (COZAAR ) 100 MG tablet Take 100 mg by mouth daily.    [provider]  metFORMIN (GLUCOPHAGE-XR) 500 MG 24 hr tablet Take 500 mg by mouth 2 (two) times daily with a meal. 07/19/23   [provider]  [facility] VERIO test strip 1 each daily. 03/23/22   [provider]  oxyCODONE -acetaminophen  (PERCOCET/ROXICET) 5-325 MG tablet Take 1-2 tablets by mouth every 6 (six) hours as needed for moderate pain (pain score 4-6). Patient not taking: Reported on 09/30/2023 04/08/23   Bergman, Meghan D, NP  pantoprazole  (PROTONIX ) 40 MG tablet Take 40 mg by mouth daily. 01/15/15   [provider]  rosuvastatin  (CRESTOR ) 10 MG tablet Take 1 tablet by mouth once daily 04/13/23   Nishan, Peter C, MD  sildenafil (REVATIO) 20 MG tablet Take 20-100 mg by mouth daily as needed (ED).    [provider]  tadalafil (CIALIS) 5 MG tablet Take 5 mg by mouth daily as needed. 06/20/23   [provider]  tamsulosin  (FLOMAX ) 0.4 MG CAPS capsule Take 1 capsule (0.4 mg total) by mouth daily. 04/09/23   Bergman, Meghan D, NP    Scheduled Meds:  clopidogrel   75 mg Oral Daily   enoxaparin  (LOVENOX ) injection  40 mg  Subcutaneous Daily   insulin  aspart  0-9 Units Subcutaneous Q4H   losartan   100 mg Oral Daily   rosuvastatin   10 mg Oral Daily   Continuous Infusions:  PRN Meds:  Allergies:    Allergies  Allergen Reactions   Penicillins Anaphylaxis    Knots on head; tongue swelling    Social History:   Social History   Socioeconomic History   Marital status: Widowed    Spouse name: Not on file   Number of children: Not on file   Years of education: Not on file   Highest education level: Not on file  Occupational History   Not on file  Tobacco Use   Smoking status: Never   Smokeless tobacco: Former    Types: Chew   Tobacco comments:    quit 2003  Vaping Use   Vaping status: Never Used  Substance and Sexual Activity   Alcohol use: Yes    Alcohol/week: 1.0 standard drink of alcohol    Types: 1 Glasses of wine per week    Comment: occasionally   Drug use: No   Sexual activity: Yes  Other Topics Concern   Not on file  Social History Narrative   Right Handed    Lives in a two story home. Lives with alone    Social Drivers of Health   Financial Resource Strain: Not on file  Food Insecurity: Not on file  Transportation Needs: Not on file  Physical Activity: Not on file  Stress: Not on file  Social Connections: Not on file  Intimate Partner Violence: Not on file    Family History:   Family History  Problem Relation Age of Onset   Cancer Sister        lung     ROS:  Please see the history of present illness.  All other ROS reviewed and negative.     Physical Exam/Data: Vitals:   02/28/24 0045 02/28/24 0100 02/28/24 0130 02/28/24 0200  BP: (!) 143/75 (!) 157/75 (!) 151/71 (!) 150/68  Pulse: (!) 57 (!) 55 (!) 52 60  Resp: 19 15 16 18   Temp:  TempSrc:      SpO2: 97% 97% 98% 98%   No intake or output data in the 24 hours ending 02/28/24 0338    09/30/2023    9:06 AM 06/01/2023   10:16 PM 04/07/2023    9:39 AM  Last 3 Weights  Weight (lbs) 180 lb 168 lb 175  lb  Weight (kg) 81.647 kg 76.204 kg 79.379 kg     There is no height or weight on file to calculate BMI.  General:  Well nourished, well developed, in no acute distress HEENT: normal Neck: no JVD Vascular: Distal pulses 2+ bilaterally Cardiac:  bradycardic, irregular rhythm, no murmurs Lungs:  clear to auscultation bilaterally, no wheezing, rhonchi or rales  Abd: soft, nontender Ext: no edema Musculoskeletal:  No deformities, BUE and BLE strength normal and equal Skin: warm and dry  Neuro:  no focal abnormalities noted Psych:  Normal affect   EKG:  The EKG was personally reviewed and demonstrates:  sinus bradycardia with LAFB Telemetry:  Telemetry was personally reviewed and demonstrates:  sinus bradycardia with sinus arrhythmia  Relevant CV Studies: TTE 11/09/2023 IMPRESSIONS   1. Left ventricular ejection fraction, by estimation, is 45 to 50%. The  left ventricle has mildly decreased function. The left ventricle  demonstrates global hypokinesis. The left ventricular internal cavity size  was mildly dilated. There is mild  asymmetric left ventricular hypertrophy of the basal-septal segment. Left  ventricular diastolic parameters are consistent with Grade I diastolic  dysfunction (impaired relaxation).   2. Right ventricular systolic function is mildly reduced. The right  ventricular size is moderately enlarged. There is normal pulmonary artery  systolic pressure.   3. Left atrial size was moderately dilated.   4. Right atrial size was moderately dilated.   5. A small pericardial effusion is present. The pericardial effusion is  localized near the right atrium. There is no evidence of cardiac  tamponade.   6. The mitral valve is normal in structure. Mild mitral valve  regurgitation. No evidence of mitral stenosis.   7. The aortic valve is tricuspid. There is mild calcification of the  aortic valve. Aortic valve regurgitation is trivial. No aortic stenosis is  present.   8.  The inferior vena cava is normal in size with greater than 50%  respiratory variability, suggesting right atrial pressure of 3 mmHg.   Laboratory Data: High Sensitivity Troponin:   Recent Labs  Lab 02/27/24 1930 02/27/24 2143  TROPONINIHS 9 8     Chemistry Recent Labs  Lab 02/27/24 1930 02/28/24 0150  NA 138  --   K 3.9  --   CL 103  --   CO2 23  --   GLUCOSE 201*  --   BUN 16  --   CREATININE 1.11 0.96  CALCIUM  9.1  --   GFRNONAA >60 >60  ANIONGAP 12  --      Hematology Recent Labs  Lab 02/27/24 1930 02/28/24 0150  WBC 5.3 4.6  RBC 4.57 4.40  HGB 13.6 13.0  HCT 42.3 40.1  MCV 92.6 91.1  MCH 29.8 29.5  MCHC 32.2 32.4  RDW 14.6 14.6  PLT 162 156   Thyroid   Recent Labs  Lab 02/28/24 0150  TSH 1.146    Radiology/Studies:  DG Chest 2 View Result Date: 02/27/2024 CLINICAL DATA:  Near syncope EXAM: CHEST - 2 VIEW COMPARISON:  CT abdomen pelvis June 02, 2023 FINDINGS: The heart size is borderline normal to mildly enlarged. Aortic knob is prominent and calcified.  Mild scarring of bilateral costophrenic angles. No consolidation. No pleural effusion. Loop recorder identified overlying the left chest wall. Mild degenerative changes of the spine. No acute osseous abnormality. IMPRESSION: Mild cardiomegaly. No acute cardiopulmonary disease. Electronically Signed   By: Megan  Zare M.D.   On: 02/27/2024 19:52   Assessment and Plan: Near syncope  Sinus bradycardia Right bundle branch block Left anterior fascicular block CAD s/p inferolateral STEMI in 2014 The patient presents with a near syncopal episode while sitting and eating dinner. He did not have much of a prodrome, and given his conduction system disease at baseline, there is concern that he may have had a bradyarrhythmia that lead to this event. Thankfully, he did not faint or strike his head. Unfortunately, we did not catch either of the events on telemetry. We will monitor him on telemetry in the hopes of  recording an event. However, the pretest probability that this was related to some sort of heart block is decently high. The differential otherwise includes TIA vs tachyarrhythmia vs vasovagal event. The patient had a recent TTE that showed mildly reduced LVEF. This was not an ACS event as he has not had any chest pain and his troponin is negative and flat. Of note, he is planning on getting married on Sept. 27th and will then have an 8 day honeymoon to [place] . He is concerned that the pacemaker restrictions may be difficult during his honeymoon. We will discuss with our EP colleagues in the morning regarding ppm vs Holter (although his story is concerning enough that he probably warrants ppm). - NPO after midnight in case of PPM later today - Hold BP meds if BP soft (hypertensive currently) - Can continue Plavix  - We will discuss with EP  Risk Assessment/Risk Scores:     For questions or updates, please contact [facility] Please consult [email or web address] for contact info under    Signed, Jerrell DELENA Orchard, MD  02/28/2024 3:38 AM

## 2024-02-28 NOTE — ED Notes (Signed)
 CCMD notified the 2nd time that patient is on a monitor .

## 2024-02-29 ENCOUNTER — Inpatient Hospital Stay (HOSPITAL_COMMUNITY)
Admit: 2024-02-29 | Discharge: 2024-02-29 | Disposition: A | Discharge status: Home / Self Care | Attending: Cardiology | Admitting: Cardiology

## 2024-02-29 DIAGNOSIS — R55 Syncope and collapse: Secondary | ICD-10-CM | POA: Diagnosis not present

## 2024-02-29 DIAGNOSIS — I452 Bifascicular block: Secondary | ICD-10-CM

## 2024-02-29 DIAGNOSIS — R001 Bradycardia, unspecified: Secondary | ICD-10-CM

## 2024-02-29 NOTE — Discharge Instructions (Signed)
 Jose Duarte,  During the hospital because of nearly passing out.  There was concern that this could be related to your lobe heart rates.  The heart doctor was consulted and a heart monitor was placed on you prior to discharge.  Please follow-up with the cardiologist as an outpatient.

## 2024-02-29 NOTE — Progress Notes (Signed)
Went over discharge paper work patient. All questions answered. PIV and telemetry removed. All belongings at bedside.

## 2024-02-29 NOTE — Care Management Obs Status (Signed)
 MEDICARE OBSERVATION STATUS NOTIFICATION   Patient Details  Name: Jose Duarte MRN: [id number] Date of Birth: [birth year]   Medicare Observation Status Notification Given:  Yes  Called the patient hospital room to inform of the obs status and will deliver coy to the room .     Americo Vallery 02/29/2024, 9:25 AM

## 2024-02-29 NOTE — Discharge Summary (Signed)
 Physician Discharge Summary   Patient: Jose Duarte MRN: [id number] DOB: 30-Jul-1940  Admit date:     02/27/2024  Discharge date: {dischdate:26783}  Discharge Physician: Elgin Lam   PCP: Rolinda Millman, MD   Recommendations at discharge:  {Tip this will not be part of the note when signed- Example include specific recommendations for outpatient follow-up, pending tests to follow-up on. (Optional):26781}  ***  Discharge Diagnoses: Principal Problem:   Near syncope Active Problems:   HTN (hypertension), benign   Hyperlipidemia LDL goal <70   CAD S/P percutaneous coronary angioplasty - PCI Ramus   Diabetes mellitus type 2 with complications - CAD   Ischemic cardiomyopathy  Resolved Problems:   * No resolved hospital problems. [facility] Course: No notes on file  Assessment and Plan: No notes have been filed under this hospital service. Service: Hospitalist     {Tip this will not be part of the note when signed Body mass index is 26.86 kg/m. , ,  (Optional):26781}  {(NOTE) Pain control PDMP Statment (Optional):26782} Consultants: *** Procedures performed: ***  Disposition: {Plan; Disposition:26390} Diet recommendation:  {Diet_Plan:26776} DISCHARGE MEDICATION: Allergies as of 02/29/2024       Reactions   Flexeril  [cyclobenzaprine ] Anaphylaxis   Psychosis.   Penicillins Anaphylaxis   Knots on head; tongue swelling   Amlodipine     Other Reaction(s): swelling in ankles        Medication List     TAKE these medications    acetaminophen  500 MG tablet Commonly known as: TYLENOL  Take 1,000 mg by mouth every 6 (six) hours as needed for mild pain or headache.   clopidogrel  75 MG tablet Commonly known as: PLAVIX  Take 1 tablet by mouth once daily What changed: when to take this   cyanocobalamin  500 MCG tablet Commonly known as: VITAMIN B12 Take 500 mcg by mouth in the morning and at bedtime.   Jardiance  25 MG Tabs tablet Generic drug:  empagliflozin  Take 25 mg by mouth daily.   losartan  100 MG tablet Commonly known as: COZAAR  Take 100 mg by mouth daily.   metFORMIN 500 MG 24 hr tablet Commonly known as: GLUCOPHAGE-XR Take 500 mg by mouth 2 (two) times daily with a meal.   pantoprazole  40 MG tablet Commonly known as: PROTONIX  Take 40 mg by mouth daily.   rosuvastatin  10 MG tablet Commonly known as: CRESTOR  Take 1 tablet by mouth once daily What changed: when to take this   tadalafil 5 MG tablet Commonly known as: CIALIS Take 5 mg by mouth at bedtime.   tetrahydrozoline 0.05 % ophthalmic solution Place 1 drop into both eyes as needed (eye rritation).        Follow-up Information     Rolinda Millman, MD. Schedule an appointment as soon as possible for a visit in 1 week(s).   Specialty: Family Medicine Why: For hospital follow-up Contact information: [address] [place] [address] [phone]         Almetta Donnice LABOR, MD Follow up on 04/16/2024.   Specialty: Cardiology Why: 2:30 PM Contact information: [address] [place] [address] [phone]                Discharge Exam: Fredricka Weights   02/28/24 1559  Weight: 77.8 kg   ***  Condition at discharge: {DC Condition:26389}  The results of significant diagnostics from this hospitalization (including imaging, microbiology, ancillary and laboratory) are listed below for reference.   Imaging Studies: DG Chest 2 View Result  Date: 02/27/2024 CLINICAL DATA:  Near syncope EXAM: CHEST - 2 VIEW COMPARISON:  CT abdomen pelvis June 02, 2023 FINDINGS: The heart size is borderline normal to mildly enlarged. Aortic knob is prominent and calcified. Mild scarring of bilateral costophrenic angles. No consolidation. No pleural effusion. Loop recorder identified overlying the left chest wall. Mild degenerative changes of the spine. No acute osseous abnormality. IMPRESSION: Mild cardiomegaly. No acute  cardiopulmonary disease. Electronically Signed   By: Megan  Zare M.D.   On: 02/27/2024 19:52    Microbiology: Results for orders placed or performed during the hospital encounter of 03/28/23  Surgical pcr screen     Status: None   Collection Time: 03/28/23  8:30 AM   Specimen: Nasal Mucosa; Nasal Swab  Result Value Ref Range Status   MRSA, PCR NEGATIVE NEGATIVE Final   Staphylococcus aureus NEGATIVE NEGATIVE Final    Comment: (NOTE) The Xpert SA Assay (FDA approved for NASAL specimens in patients 15 years of age and older), is one component of a comprehensive surveillance program. It is not intended to diagnose infection nor to guide or monitor treatment. Performed at [facility], [address], [place], [place] [address]     Labs: CBC: Recent Labs  Lab 02/27/24 1930 02/28/24 0150 02/28/24 0450  WBC 5.3 4.6 4.0  HGB 13.6 13.0 13.4  HCT 42.3 40.1 40.4  MCV 92.6 91.1 91.0  PLT 162 156 146*   Basic Metabolic Panel: Recent Labs  Lab 02/27/24 1930 02/28/24 0150 02/28/24 0450  NA 138  --  141  K 3.9  --  3.8  CL 103  --  107  CO2 23  --  20*  GLUCOSE 201*  --  130*  BUN 16  --  14  CREATININE 1.11 0.96 0.91  CALCIUM  9.1  --  9.2   Liver Function Tests: No results for input(s): AST, ALT, ALKPHOS, BILITOT, PROT, ALBUMIN in the last 168 hours. CBG: Recent Labs  Lab 02/28/24 0251 02/28/24 0752 02/28/24 1134  GLUCAP 133* 135* 103*    Discharge time spent: {LESS THAN/GREATER THAN:26388} 30 minutes.  Signed: Elgin Lam, MD [facility] 02/29/2024

## 2024-02-29 NOTE — Progress Notes (Addendum)
  Patient Name: NICHOLAUS STEINKE Date of Encounter: 02/29/2024  Primary Cardiologist: Maude Emmer, MD Electrophysiologist: None  Interval Summary   NAEON.  No episodes of presyncope or LH.   Vital Signs    Vitals:   02/29/24 0405 02/29/24 0453 02/29/24 0600 02/29/24 0745  BP:  (!) 160/67  (!) 180/78  Pulse: 61 (!) 52 (!) 46 (!) 58  Resp: 17 17 16 20   Temp: 98.1 F (36.7 C)   98.1 F (36.7 C)  TempSrc: Oral   Oral  SpO2: 95% 95% 96% 94%  Weight:      Height:        Intake/Output Summary (Last 24 hours) at 02/29/2024 0809 Last data filed at 02/29/2024 0747 Gross per 24 hour  Intake 1200 ml  Output 1200 ml  Net 0 ml   Filed Weights   02/28/24 1559  Weight: 77.8 kg    Physical Exam    GEN- NAD, Alert and oriented  Lungs- Clear to ausculation bilaterally, normal work of breathing Cardiac- Regular rate and rhythm, no murmurs, rubs or gallops GI- soft, NT, ND, + BS Extremities- no clubbing or cyanosis. No edema  Telemetry    SB high 40-60s, very brief Atach (personally reviewed)  Hospital Course    PLUMER MITTELSTAEDT is a 83 y.o. male with Pmh of CAD s/p PCI (2014), HTN, T2DM, bradycardia, cryptogenic CVA s/p ILR implanted (2018)  admitted for presyncope  Assessment & Plan    #) bradycardia #) RBBB, LAFB #) presyncope  No presyncopal episodes overnight while on tele Continues to have asymptomatic bradycardia Discussed monitoring options including ILR, watchful waiting, and ambulatory monitor. Patient agreeable to ambulatory monitor.  Will have patient walk with nursing while on monitor to assess for chronotropic competency.   EP to sign off at this time, OK to discharge from EP perspective    For questions or updates, please contact [facility] Please consult [email or web address] for contact info under     Signed, Suzann Riddle, NP  02/29/2024, 8:09 AM

## 2024-02-29 NOTE — Progress Notes (Signed)
 Patient seen and examined with Suzann Riddle.   General: well appearing, conversant Cardiac: RRR, no M/G/R, S1/S2+ Pulmonary: CTAB  No overnight events.  Telemetry with sinus bradycardia with occasional PACs but no prolonged pauses.  Longest pause after PAC around 2 seconds.  No recurrent symptomatic presyncopal or syncopal events.  Reviewed several options with Mr. Redel for ongoing monitoring including a short-term wearable monitor versus replacement of his ILR.  Since he has not had any episodes besides these 2 events recently he did not want longer-term monitoring yet.  He is okay wearing the monitor for a few weeks.  Will coordinate clinic follow-up after this. EP will sign off.   Donnice DELENA Primus, MD [facility]  Cardiac Electrophysiology

## 2024-02-29 NOTE — TOC CM/SW Note (Signed)
 Transition of Care [facility]) - Inpatient Brief Assessment   Patient Details  Name: Jose Duarte MRN: [id number] Date of Birth: [birth year]  Transition of Care [facility]) CM/SW Contact:    Lauraine FORBES Saa, LCSWA Phone Number: 02/29/2024, 9:02 AM   Clinical Narrative:  9:02 AM Per chart review, patient resides at home alone. Patient has a PCP and insurance. Patient does not have SNF or HH history. Patient has DME (CPAP) history with [facility]. Patient's preferred pharmacy is [facility] [address]. No TOC needs identified at this time. TOC will continue to follow and be available to assist.  Transition of Care Asessment: Insurance and Status: Insurance coverage has been reviewed Patient has primary care physician: Yes Home environment has been reviewed: Private Residence Prior level of function:: N/A Prior/Current Home Services: No current home services Social Drivers of Health Review: SDOH reviewed no interventions necessary Readmission risk has been reviewed: Yes (Currently Observation Status) Transition of care needs: no transition of care needs at this time

## 2024-03-03 DIAGNOSIS — R55 Syncope and collapse: Secondary | ICD-10-CM | POA: Diagnosis not present

## 2024-03-03 NOTE — Hospital Course (Signed)
 Jose Duarte is a 83 y.o. male with a history of CAD, diabetes mellitus, hypertension.  Patient presented secondary to near syncope with associated bradycardia. Cardiology consulted and patient monitored on telemetry. Cardiology with recommendation to defer placement of pacemaker; patient set up with a heart monitor on discharge.

## 2024-03-07 DIAGNOSIS — K219 Gastro-esophageal reflux disease without esophagitis: Secondary | ICD-10-CM | POA: Diagnosis not present

## 2024-03-07 DIAGNOSIS — Z09 Encounter for follow-up examination after completed treatment for conditions other than malignant neoplasm: Secondary | ICD-10-CM | POA: Diagnosis not present

## 2024-03-07 DIAGNOSIS — J309 Allergic rhinitis, unspecified: Secondary | ICD-10-CM | POA: Diagnosis not present

## 2024-03-07 DIAGNOSIS — R55 Syncope and collapse: Secondary | ICD-10-CM | POA: Diagnosis not present

## 2024-03-23 NOTE — Addendum Note (Signed)
 Encounter addended by: Malvina Pina A on: 03/23/2024 7:43 AM  Actions taken: Imaging Exam ended

## 2024-03-28 ENCOUNTER — Other Ambulatory Visit: Payer: Self-pay | Admitting: Physician Assistant

## 2024-04-15 NOTE — Progress Notes (Unsigned)
 Cardiology Office Note   Date:  04/16/2024  ID:  Jose Duarte 09-29-1940, MRN [id number] PCP: Jose Millman, MD  [facility] Providers Cardiologist:  Jose Emmer, MD Electrophysiologist:  Jose DELENA Primus, MD   History of Present Illness Jose Duarte is a 83 y.o. male is an 31M with CAD s/p PCI (2014), HTN, DM2, bradycardia and cryptogenic CVA s/p ILR implant (EOS) who presents to clinic for evaluation of near syncope in the context of conduction disease with bifascicular block (RBBB/LAFB).   I initially saw him when he was hospitalized on 02/28/2024 after admission for an episode of isolated syncope.  ECG on presentation with SB 57, PR 184, QRS 170, QT/c 464/451, RBBB/LAFB. He had sinus bradycardia with bifascicular block last year (01/03/23) with distant past ECG (03/24/15) with normal conduction. He had intermittent LAFB in 2016 as well but no evidence of RBBB until 2024.   He had a recurrent episode while being evaluated in the emergency department but was not yet hooked up to monitor and assess his rhythm at that time.  I had offered 30-day monitor versus ILR replacement as his conduction was stable during inpatient observation and he had no acute indication for PPM implant at that time.  Discussed the use of AI scribe software for clinical note transcription with the patient, who gave verbal consent to proceed. History of Present Illness He has not experienced any episodes of syncope or 'fallout episodes' since his last visit. He recently returned from an eight-day honeymoon trip to [place] , which he enjoyed despite a minor incident of losing and subsequently recovering his wedding band.  He wore a heart monitor for two weeks, which recorded one pause lasting almost four seconds and another pause lasting several seconds, both early morning while asleep, specifically at 2:19 AM and 3:50 AM. No dizziness or lightheadedness during the day has been noted.  He  also mentions having sleep apnea, which was discussed during a previous hospital visit.  He reports issues with balance and difficulty initiating movement upon standing. He had back surgery a year ago, where a plate was inserted to relieve pressure on his spinal cord. He plans to follow up with Dr. Reyes Duarte, who performed the surgery, for these ongoing issues.  ROS: none  Studies Reviewed  ECG review 02/27/24: (22:58:33), SB 46, PR 189, QRS 184, QT/c 491/430, RBBB/LAFB 02/27/24: (19:33:46), SB 57, PR 184, QRS 170, QT/c 464/451, RBBB/LAFB  Zio monitor Result date: 02/29/24-03/14/24 Patient had a min HR of 41 bpm, max HR of 179 bpm, and avg HR of 59 bpm. Predominant underlying rhythm was Sinus Rhythm. Intermittent Bundle Branch Block was present. 2 Ventricular Tachycardia runs occurred, the run with the fastest interval lasting 9  beats with a max rate of 145 bpm (avg 121 bpm); the run with the fastest interval was also the longest. 6 Supraventricular Tachycardia runs occurred, the run with the fastest interval lasting 10 beats with a max rate of 179 bpm (avg 167 bpm); the run  with the fastest interval was also the longest. 4 Pauses occurred, the longest lasting 3.9 secs (16 bpm). Isolated SVEs were rare (<1.0%), SVE Couplets were rare (<1.0%), and SVE Triplets were rare (<1.0%). Isolated VEs were rare (<1.0%, 4565), VE  Couplets were rare (<1.0%, 49), and VE Triplets were rare (<1.0%, 2).  TTE Result date: 11/09/23  1. Left ventricular ejection fraction, by estimation, is 45 to 50%. The  left ventricle has mildly decreased function. The left ventricle  demonstrates global hypokinesis. The left ventricular internal cavity size  was mildly dilated. There is mild  asymmetric left ventricular hypertrophy of the basal-septal segment. Left  ventricular diastolic parameters are consistent with Grade I diastolic  dysfunction (impaired relaxation).   2. Right ventricular systolic function  is mildly reduced. The right  ventricular size is moderately enlarged. There is normal pulmonary artery  systolic pressure.   3. Left atrial size was moderately dilated.   4. Right atrial size was moderately dilated.   5. A small pericardial effusion is present. The pericardial effusion is  localized near the right atrium. There is no evidence of cardiac  tamponade.   6. The mitral valve is normal in structure. Mild mitral valve  regurgitation. No evidence of mitral stenosis.   7. The aortic valve is tricuspid. There is mild calcification of the  aortic valve. Aortic valve regurgitation is trivial. No aortic stenosis is  present.   8. The inferior vena cava is normal in size with greater than 50%  respiratory variability, suggesting right atrial pressure of 3 mmHg.      Physical Exam VS:  BP (!) 167/79   Pulse 62   Ht 5' 7 (1.702 m)   Wt 175 lb (79.4 kg)   SpO2 95%   BMI 27.41 kg/m       Wt Readings from Last 3 Encounters:  04/16/24 175 lb (79.4 kg)  02/28/24 171 lb 8.3 oz (77.8 kg)  09/30/23 180 lb (81.6 kg)    GEN: Well nourished, well developed in no acute distress CARDIAC: RRR, no murmurs, rubs, gallops RESPIRATORY:  Clear to auscultation without rales, wheezing or rhonchi  EXTREMITIES:  No edema; No deformity   ASSESSMENT AND PLAN Jose Duarte is a 83 y.o. male is an 16M with CAD s/p PCI (2014), HTN, DM2, bradycardia and cryptogenic CVA s/p ILR implant (EOS) who presents to clinic for evaluation of near syncope in the context of conduction disease with bifascicular block (RBBB/LAFB).   Bifascicular block  Prior syncope No recurrent events since I met him in the hospital.  We discussed that conduction disease can progress with time and that he currently is relying on his LPF for conduction.  He has not had any documented syncopal episodes with associated high degree AV block.  I had offered ILR during his last hospitalization but at that time he declined.  Nothing  notable on recent monitor besides brief pauses while asleep with known history of OSA.  I provided my contact information to him and explained that if he starts to develop any dizziness, lightheadedness, falls or presyncope/syncope to please reach out as his conduction disease may progress with time.     Dispo: RTC prn, contact information provided  A total of 25 minutes was spent preparing for the patient, reviewing history, performing exam, document encounter, coordinating care and counseling the patient. 11 minutes was spent with direct patient care.   Signed, Jose DELENA Primus, MD

## 2024-04-16 ENCOUNTER — Encounter: Payer: Self-pay | Admitting: Student in an Organized Health Care Education/Training Program

## 2024-04-16 ENCOUNTER — Ambulatory Visit
Attending: Student in an Organized Health Care Education/Training Program | Admitting: Student in an Organized Health Care Education/Training Program

## 2024-04-16 VITALS — BP 167/79 | HR 62 | Ht 67.0 in | Wt 175.0 lb

## 2024-04-16 DIAGNOSIS — R55 Syncope and collapse: Secondary | ICD-10-CM

## 2024-04-16 NOTE — Patient Instructions (Signed)
 Medication Instructions:  Your physician recommends that you continue on your current medications as directed. Please refer to the Current Medication list given to you today.  *If you need a refill on your cardiac medications before your next appointment, please call your pharmacy*  Lab Work: None ordered.  You may go to any [facility] Location for your lab work:  [facility] - [id number] ([facility]) - [address] [phone] [address]  [place] - [address]   [place]  - [address]   [place] - [address] - [address] [employer]  - [address] - [address] ([facility]   If you have labs (blood work) drawn today and your tests are completely normal, you will receive your results only by: [employer] (if you have MyChart)  If you have any lab test that is abnormal or we need to change your treatment, we will call you or send a MyChart message to review the results.  Testing/Procedures: None ordered.  Follow-Up: At [facility], you and your health needs are our priority.  As part of our continuing mission to provide you with exceptional heart care, we have created designated Provider Care Teams.  These Care Teams include your primary Cardiologist (physician) and Advanced Practice Providers (APPs -  Physician Assistants and Nurse Practitioners) who all work together to provide you with the care you need, when you need it.   Your next appointment:   As needed  The format for your next appointment:   In Person  Provider:   Donnice Primus, MD or one of the following Advanced Practice Providers on your designated Care Team:   Charlies Arthur, PA-C Michael Andy Tillery, PA-C Brandy Ollis, NP

## 2024-05-09 ENCOUNTER — Other Ambulatory Visit: Payer: Self-pay | Admitting: Cardiovascular Disease

## 2024-05-29 ENCOUNTER — Other Ambulatory Visit: Payer: Self-pay | Admitting: Neurosurgery

## 2024-05-29 DIAGNOSIS — M4316 Spondylolisthesis, lumbar region: Secondary | ICD-10-CM

## 2024-06-03 DIAGNOSIS — R001 Bradycardia, unspecified: Secondary | ICD-10-CM | POA: Diagnosis not present

## 2024-06-03 DIAGNOSIS — I452 Bifascicular block: Secondary | ICD-10-CM

## 2024-06-03 DIAGNOSIS — R55 Syncope and collapse: Secondary | ICD-10-CM

## 2024-06-04 ENCOUNTER — Ambulatory Visit: Payer: Self-pay | Admitting: Cardiology

## 2024-07-06 NOTE — Discharge Instructions (Signed)

## 2024-07-10 ENCOUNTER — Ambulatory Visit
Admission: RE | Admit: 2024-07-10 | Discharge: 2024-07-10 | Disposition: A | Source: Ambulatory Visit | Attending: Neurosurgery | Admitting: Neurosurgery

## 2024-07-10 DIAGNOSIS — M4316 Spondylolisthesis, lumbar region: Secondary | ICD-10-CM

## 2024-07-10 MED ORDER — IOPAMIDOL (ISOVUE-M 200) INJECTION 41%
20.0000 mL | Freq: Once | INTRAMUSCULAR | Status: AC
  Administered 2024-07-10: 20 mL via INTRATHECAL

## 2024-10-02 ENCOUNTER — Ambulatory Visit: Payer: Self-pay | Admitting: Cardiovascular Disease
